# Patient Record
Sex: Female | Born: 1940
Health system: Southern US, Community
[De-identification: ages and names within clinical notes are randomized; demographics above are authoritative.]

## PROBLEM LIST (undated history)

## (undated) DIAGNOSIS — I00 Rheumatic fever without heart involvement: Secondary | ICD-10-CM

## (undated) DIAGNOSIS — E039 Hypothyroidism, unspecified: Secondary | ICD-10-CM

## (undated) DIAGNOSIS — I10 Essential (primary) hypertension: Secondary | ICD-10-CM

## (undated) DIAGNOSIS — Z9289 Personal history of other medical treatment: Secondary | ICD-10-CM

## (undated) DIAGNOSIS — E785 Hyperlipidemia, unspecified: Secondary | ICD-10-CM

## (undated) HISTORY — DX: Personal history of other medical treatment: Z92.89

## (undated) HISTORY — DX: Hyperlipidemia, unspecified: E78.5

## (undated) HISTORY — DX: Essential (primary) hypertension: I10

## (undated) HISTORY — DX: Rheumatic fever without heart involvement: I00

## (undated) HISTORY — DX: Hypothyroidism, unspecified: E03.9

---

## 1971-10-22 HISTORY — PX: TUBAL LIGATION: SHX77

## 1989-10-21 HISTORY — PX: ROTATOR CUFF REPAIR: SHX139

## 2001-03-12 ENCOUNTER — Encounter: Payer: Self-pay | Admitting: Internal Medicine

## 2001-03-12 ENCOUNTER — Ambulatory Visit (HOSPITAL_COMMUNITY): Admission: RE | Admit: 2001-03-12 | Discharge: 2001-03-12 | Payer: Self-pay | Admitting: Internal Medicine

## 2001-03-18 ENCOUNTER — Encounter: Payer: Self-pay | Admitting: Internal Medicine

## 2001-03-18 ENCOUNTER — Ambulatory Visit (HOSPITAL_COMMUNITY): Admission: RE | Admit: 2001-03-18 | Discharge: 2001-03-18 | Payer: Self-pay | Admitting: Internal Medicine

## 2001-12-31 ENCOUNTER — Encounter: Payer: Self-pay | Admitting: Internal Medicine

## 2001-12-31 ENCOUNTER — Ambulatory Visit (HOSPITAL_COMMUNITY): Admission: RE | Admit: 2001-12-31 | Discharge: 2001-12-31 | Payer: Self-pay | Admitting: Internal Medicine

## 2003-03-24 ENCOUNTER — Ambulatory Visit (HOSPITAL_COMMUNITY): Admission: RE | Admit: 2003-03-24 | Discharge: 2003-03-24 | Payer: Self-pay | Admitting: Internal Medicine

## 2003-03-24 ENCOUNTER — Encounter: Payer: Self-pay | Admitting: Internal Medicine

## 2003-03-25 ENCOUNTER — Ambulatory Visit (HOSPITAL_COMMUNITY): Admission: RE | Admit: 2003-03-25 | Discharge: 2003-03-25 | Payer: Self-pay | Admitting: Internal Medicine

## 2003-03-25 ENCOUNTER — Encounter: Payer: Self-pay | Admitting: Internal Medicine

## 2003-05-11 ENCOUNTER — Encounter: Payer: Self-pay | Admitting: Internal Medicine

## 2003-05-11 ENCOUNTER — Ambulatory Visit (HOSPITAL_COMMUNITY): Admission: RE | Admit: 2003-05-11 | Discharge: 2003-05-11 | Payer: Self-pay | Admitting: Internal Medicine

## 2003-08-11 ENCOUNTER — Ambulatory Visit (HOSPITAL_COMMUNITY): Admission: RE | Admit: 2003-08-11 | Discharge: 2003-08-11 | Payer: Self-pay | Admitting: Internal Medicine

## 2003-08-11 ENCOUNTER — Encounter: Payer: Self-pay | Admitting: Internal Medicine

## 2004-06-14 ENCOUNTER — Ambulatory Visit (HOSPITAL_COMMUNITY): Admission: RE | Admit: 2004-06-14 | Discharge: 2004-06-14 | Payer: Self-pay | Admitting: Internal Medicine

## 2004-06-18 ENCOUNTER — Other Ambulatory Visit: Admission: RE | Admit: 2004-06-18 | Discharge: 2004-06-18 | Payer: Self-pay | Admitting: *Deleted

## 2004-07-02 ENCOUNTER — Ambulatory Visit (HOSPITAL_COMMUNITY): Admission: RE | Admit: 2004-07-02 | Discharge: 2004-07-02 | Payer: Self-pay | Admitting: Family Medicine

## 2004-08-23 ENCOUNTER — Ambulatory Visit (HOSPITAL_COMMUNITY): Admission: RE | Admit: 2004-08-23 | Discharge: 2004-08-23 | Payer: Self-pay | Admitting: Family Medicine

## 2004-09-07 ENCOUNTER — Ambulatory Visit (HOSPITAL_COMMUNITY): Admission: RE | Admit: 2004-09-07 | Discharge: 2004-09-07 | Payer: Self-pay | Admitting: General Surgery

## 2005-07-12 ENCOUNTER — Ambulatory Visit (HOSPITAL_COMMUNITY): Admission: RE | Admit: 2005-07-12 | Discharge: 2005-07-12 | Payer: Self-pay | Admitting: Internal Medicine

## 2005-07-15 ENCOUNTER — Ambulatory Visit (HOSPITAL_COMMUNITY): Admission: RE | Admit: 2005-07-15 | Discharge: 2005-07-15 | Payer: Self-pay | Admitting: Internal Medicine

## 2005-07-26 ENCOUNTER — Encounter: Admission: RE | Admit: 2005-07-26 | Discharge: 2005-07-26 | Payer: Self-pay | Admitting: Internal Medicine

## 2006-03-19 ENCOUNTER — Other Ambulatory Visit: Admission: RE | Admit: 2006-03-19 | Discharge: 2006-03-19 | Payer: Self-pay | Admitting: *Deleted

## 2006-08-11 ENCOUNTER — Ambulatory Visit (HOSPITAL_COMMUNITY): Admission: RE | Admit: 2006-08-11 | Discharge: 2006-08-11 | Payer: Self-pay | Admitting: Internal Medicine

## 2007-08-13 ENCOUNTER — Ambulatory Visit (HOSPITAL_COMMUNITY): Admission: RE | Admit: 2007-08-13 | Discharge: 2007-08-13 | Payer: Self-pay | Admitting: Internal Medicine

## 2008-03-31 ENCOUNTER — Ambulatory Visit (HOSPITAL_COMMUNITY): Admission: RE | Admit: 2008-03-31 | Discharge: 2008-03-31 | Payer: Self-pay | Admitting: Internal Medicine

## 2008-04-11 ENCOUNTER — Ambulatory Visit (HOSPITAL_COMMUNITY): Admission: RE | Admit: 2008-04-11 | Discharge: 2008-04-11 | Payer: Self-pay | Admitting: Internal Medicine

## 2008-08-25 ENCOUNTER — Ambulatory Visit (HOSPITAL_COMMUNITY): Admission: RE | Admit: 2008-08-25 | Discharge: 2008-08-25 | Payer: Self-pay | Admitting: Internal Medicine

## 2008-09-07 ENCOUNTER — Ambulatory Visit (HOSPITAL_COMMUNITY): Admission: RE | Admit: 2008-09-07 | Discharge: 2008-09-07 | Payer: Self-pay | Admitting: Internal Medicine

## 2009-03-29 ENCOUNTER — Ambulatory Visit (HOSPITAL_COMMUNITY): Admission: RE | Admit: 2009-03-29 | Discharge: 2009-03-29 | Payer: Self-pay | Admitting: Internal Medicine

## 2009-04-04 ENCOUNTER — Encounter (HOSPITAL_COMMUNITY): Admission: RE | Admit: 2009-04-04 | Discharge: 2009-05-04 | Payer: Self-pay | Admitting: Internal Medicine

## 2009-08-28 ENCOUNTER — Ambulatory Visit (HOSPITAL_COMMUNITY): Admission: RE | Admit: 2009-08-28 | Discharge: 2009-08-28 | Payer: Self-pay | Admitting: Internal Medicine

## 2009-10-19 ENCOUNTER — Ambulatory Visit (HOSPITAL_COMMUNITY): Admission: RE | Admit: 2009-10-19 | Discharge: 2009-10-19 | Payer: Self-pay | Admitting: Family Medicine

## 2009-12-04 ENCOUNTER — Ambulatory Visit (HOSPITAL_COMMUNITY): Admission: RE | Admit: 2009-12-04 | Discharge: 2009-12-04 | Payer: Self-pay | Admitting: Family Medicine

## 2009-12-07 ENCOUNTER — Ambulatory Visit (HOSPITAL_COMMUNITY): Admission: RE | Admit: 2009-12-07 | Discharge: 2009-12-07 | Payer: Self-pay | Admitting: Family Medicine

## 2010-01-04 ENCOUNTER — Encounter (HOSPITAL_COMMUNITY): Admission: RE | Admit: 2010-01-04 | Discharge: 2010-02-03 | Payer: Self-pay | Admitting: Family Medicine

## 2010-02-05 ENCOUNTER — Encounter (HOSPITAL_COMMUNITY): Admission: RE | Admit: 2010-02-05 | Discharge: 2010-03-08 | Payer: Self-pay | Admitting: Family Medicine

## 2010-09-20 ENCOUNTER — Ambulatory Visit (HOSPITAL_COMMUNITY): Admission: RE | Admit: 2010-09-20 | Discharge: 2010-09-20 | Payer: Self-pay | Admitting: Internal Medicine

## 2010-11-11 ENCOUNTER — Encounter: Payer: Self-pay | Admitting: Internal Medicine

## 2011-03-08 NOTE — H&P (Signed)
NAME:  Kaitlyn Baker, RABEN NO.:  [id number]   MEDICAL RECORD NO.:  [id number]          PATIENT TYPE:   LOCATION:                                 FACILITY:   PHYSICIAN:  Dalia Heading, M.D.  DATE OF BIRTH:  1941-05-07   DATE OF ADMISSION:  DATE OF DISCHARGE:  LH                                HISTORY & PHYSICAL   CHIEF COMPLAINT:  Hematochezia, change in bowel habits.   HISTORY OF PRESENT ILLNESS:  The patient is a 70 year old white female who  is referred for endoscopic evaluation.  She needs a colonoscopy for  hematochezia.  No abdominal pain, weight loss, nausea, vomiting, diarrhea,  constipation, or melena have been noted.  She has also had a change in bowel  habits.  Her stools seem looser.  She has never had a colonoscopy.  There is  no family history of colon carcinoma.   PAST MEDICAL HISTORY:  1.  Hypertension.  2.  Depression.   PAST SURGICAL HISTORY:  1.  Rotator cuff repair.  2.  Tubal ligation.   CURRENT MEDICATIONS:  1.  Lotrel.  2.  Lexapro.  3.  Lunesta.   ALLERGIES:  No known drug allergies.   REVIEW OF SYSTEMS:  Noncontributory.   PHYSICAL EXAMINATION:  GENERAL:  The patient is a well-developed, well-  nourished white female in no acute distress.  VITAL SIGNS:  Afebrile, vital signs stable.  LUNGS:  Clear to auscultation with equal breath sounds bilaterally.  HEART:  Regular rate and rhythm without S3, S4, or murmurs.  ABDOMEN:  Benign.  RECTAL:  Deferred to the procedure.   IMPRESSION:  1.  Hematochezia.  2.  Change in bowel habits.   PLAN:  The patient is scheduled for a colonoscopy on September 07, 2004.  The  risks and benefits of the procedure, including bleeding and perforation were  fully explained to the patient, who gave informed consent.     Mark   MAJ/MEDQ  D:  08/30/2004  T:  08/30/2004  Job:  [id number]   cc:   Short Stay at [facility]   Corrie Mckusick, M.D.  Fax: [phone]

## 2011-08-12 ENCOUNTER — Other Ambulatory Visit (HOSPITAL_COMMUNITY): Payer: Self-pay | Admitting: Internal Medicine

## 2011-08-12 DIAGNOSIS — Z139 Encounter for screening, unspecified: Secondary | ICD-10-CM

## 2011-09-23 ENCOUNTER — Ambulatory Visit (HOSPITAL_COMMUNITY)
Admission: RE | Admit: 2011-09-23 | Discharge: 2011-09-23 | Disposition: A | Discharge status: Home / Self Care | Payer: Medicare Other | Source: Ambulatory Visit | Attending: Internal Medicine | Admitting: Internal Medicine

## 2011-09-23 DIAGNOSIS — Z139 Encounter for screening, unspecified: Secondary | ICD-10-CM

## 2011-09-23 DIAGNOSIS — Z1231 Encounter for screening mammogram for malignant neoplasm of breast: Secondary | ICD-10-CM | POA: Insufficient documentation

## 2012-04-16 DIAGNOSIS — H04129 Dry eye syndrome of unspecified lacrimal gland: Secondary | ICD-10-CM | POA: Diagnosis not present

## 2012-04-16 DIAGNOSIS — H2510 Age-related nuclear cataract, unspecified eye: Secondary | ICD-10-CM | POA: Diagnosis not present

## 2012-04-16 DIAGNOSIS — Q159 Congenital malformation of eye, unspecified: Secondary | ICD-10-CM | POA: Diagnosis not present

## 2012-04-16 DIAGNOSIS — H40019 Open angle with borderline findings, low risk, unspecified eye: Secondary | ICD-10-CM | POA: Diagnosis not present

## 2012-04-20 DIAGNOSIS — I10 Essential (primary) hypertension: Secondary | ICD-10-CM | POA: Diagnosis not present

## 2012-04-20 DIAGNOSIS — R079 Chest pain, unspecified: Secondary | ICD-10-CM | POA: Diagnosis not present

## 2012-04-20 DIAGNOSIS — Z6828 Body mass index (BMI) 28.0-28.9, adult: Secondary | ICD-10-CM | POA: Diagnosis not present

## 2012-05-21 DIAGNOSIS — E782 Mixed hyperlipidemia: Secondary | ICD-10-CM | POA: Diagnosis not present

## 2012-05-21 DIAGNOSIS — R079 Chest pain, unspecified: Secondary | ICD-10-CM | POA: Diagnosis not present

## 2012-05-21 DIAGNOSIS — I10 Essential (primary) hypertension: Secondary | ICD-10-CM | POA: Diagnosis not present

## 2012-05-28 DIAGNOSIS — R079 Chest pain, unspecified: Secondary | ICD-10-CM | POA: Diagnosis not present

## 2012-05-28 DIAGNOSIS — E782 Mixed hyperlipidemia: Secondary | ICD-10-CM | POA: Diagnosis not present

## 2012-05-28 DIAGNOSIS — I10 Essential (primary) hypertension: Secondary | ICD-10-CM | POA: Diagnosis not present

## 2012-05-28 DIAGNOSIS — Z8249 Family history of ischemic heart disease and other diseases of the circulatory system: Secondary | ICD-10-CM | POA: Diagnosis not present

## 2012-08-10 DIAGNOSIS — E782 Mixed hyperlipidemia: Secondary | ICD-10-CM | POA: Diagnosis not present

## 2012-08-10 DIAGNOSIS — Z79899 Other long term (current) drug therapy: Secondary | ICD-10-CM | POA: Diagnosis not present

## 2012-08-13 DIAGNOSIS — I10 Essential (primary) hypertension: Secondary | ICD-10-CM | POA: Diagnosis not present

## 2012-08-13 DIAGNOSIS — E782 Mixed hyperlipidemia: Secondary | ICD-10-CM | POA: Diagnosis not present

## 2012-08-27 ENCOUNTER — Other Ambulatory Visit (HOSPITAL_COMMUNITY): Payer: Self-pay | Admitting: Internal Medicine

## 2012-08-27 DIAGNOSIS — Z139 Encounter for screening, unspecified: Secondary | ICD-10-CM

## 2012-08-28 ENCOUNTER — Other Ambulatory Visit (HOSPITAL_COMMUNITY): Payer: Self-pay | Admitting: Internal Medicine

## 2012-08-28 DIAGNOSIS — J042 Acute laryngotracheitis: Secondary | ICD-10-CM | POA: Diagnosis not present

## 2012-08-28 DIAGNOSIS — Z13820 Encounter for screening for osteoporosis: Secondary | ICD-10-CM

## 2012-08-28 DIAGNOSIS — Z0000 Encounter for general adult medical examination without abnormal findings: Secondary | ICD-10-CM

## 2012-08-28 DIAGNOSIS — Z139 Encounter for screening, unspecified: Secondary | ICD-10-CM

## 2012-08-28 DIAGNOSIS — I10 Essential (primary) hypertension: Secondary | ICD-10-CM | POA: Diagnosis not present

## 2012-08-31 DIAGNOSIS — Z23 Encounter for immunization: Secondary | ICD-10-CM | POA: Diagnosis not present

## 2012-09-03 ENCOUNTER — Other Ambulatory Visit (HOSPITAL_COMMUNITY): Payer: Medicare Other

## 2012-09-08 ENCOUNTER — Ambulatory Visit (HOSPITAL_COMMUNITY)
Admission: RE | Admit: 2012-09-08 | Discharge: 2012-09-08 | Disposition: A | Discharge status: Home / Self Care | Payer: Medicare Other | Source: Ambulatory Visit | Attending: Internal Medicine | Admitting: Internal Medicine

## 2012-09-08 DIAGNOSIS — M810 Age-related osteoporosis without current pathological fracture: Secondary | ICD-10-CM | POA: Insufficient documentation

## 2012-09-08 DIAGNOSIS — Z13820 Encounter for screening for osteoporosis: Secondary | ICD-10-CM

## 2012-09-08 DIAGNOSIS — Z0000 Encounter for general adult medical examination without abnormal findings: Secondary | ICD-10-CM

## 2012-09-28 ENCOUNTER — Ambulatory Visit (HOSPITAL_COMMUNITY)
Admission: RE | Admit: 2012-09-28 | Discharge: 2012-09-28 | Disposition: A | Discharge status: Home / Self Care | Payer: Medicare Other | Source: Ambulatory Visit | Attending: Internal Medicine | Admitting: Internal Medicine

## 2012-09-28 DIAGNOSIS — Z1231 Encounter for screening mammogram for malignant neoplasm of breast: Secondary | ICD-10-CM | POA: Diagnosis not present

## 2012-09-28 DIAGNOSIS — Z139 Encounter for screening, unspecified: Secondary | ICD-10-CM

## 2012-11-23 DIAGNOSIS — H40019 Open angle with borderline findings, low risk, unspecified eye: Secondary | ICD-10-CM | POA: Diagnosis not present

## 2012-12-10 DIAGNOSIS — H40019 Open angle with borderline findings, low risk, unspecified eye: Secondary | ICD-10-CM | POA: Diagnosis not present

## 2012-12-10 DIAGNOSIS — H16109 Unspecified superficial keratitis, unspecified eye: Secondary | ICD-10-CM | POA: Diagnosis not present

## 2013-03-09 DIAGNOSIS — M545 Low back pain: Secondary | ICD-10-CM | POA: Diagnosis not present

## 2013-03-09 DIAGNOSIS — M5430 Sciatica, unspecified side: Secondary | ICD-10-CM | POA: Diagnosis not present

## 2013-03-09 DIAGNOSIS — Z6828 Body mass index (BMI) 28.0-28.9, adult: Secondary | ICD-10-CM | POA: Diagnosis not present

## 2013-03-18 DIAGNOSIS — L820 Inflamed seborrheic keratosis: Secondary | ICD-10-CM | POA: Diagnosis not present

## 2013-03-18 DIAGNOSIS — D235 Other benign neoplasm of skin of trunk: Secondary | ICD-10-CM | POA: Diagnosis not present

## 2013-03-18 DIAGNOSIS — L680 Hirsutism: Secondary | ICD-10-CM | POA: Diagnosis not present

## 2013-03-18 DIAGNOSIS — L570 Actinic keratosis: Secondary | ICD-10-CM | POA: Diagnosis not present

## 2013-06-11 DIAGNOSIS — H40019 Open angle with borderline findings, low risk, unspecified eye: Secondary | ICD-10-CM | POA: Diagnosis not present

## 2013-06-11 DIAGNOSIS — H04129 Dry eye syndrome of unspecified lacrimal gland: Secondary | ICD-10-CM | POA: Diagnosis not present

## 2013-06-11 DIAGNOSIS — H1045 Other chronic allergic conjunctivitis: Secondary | ICD-10-CM | POA: Diagnosis not present

## 2013-07-03 DIAGNOSIS — IMO0002 Reserved for concepts with insufficient information to code with codable children: Secondary | ICD-10-CM | POA: Diagnosis not present

## 2013-07-03 DIAGNOSIS — Z23 Encounter for immunization: Secondary | ICD-10-CM | POA: Diagnosis not present

## 2013-07-03 DIAGNOSIS — Z681 Body mass index (BMI) 19 or less, adult: Secondary | ICD-10-CM | POA: Diagnosis not present

## 2013-07-03 DIAGNOSIS — I10 Essential (primary) hypertension: Secondary | ICD-10-CM | POA: Diagnosis not present

## 2013-07-03 DIAGNOSIS — G5600 Carpal tunnel syndrome, unspecified upper limb: Secondary | ICD-10-CM | POA: Diagnosis not present

## 2013-07-06 DIAGNOSIS — Z23 Encounter for immunization: Secondary | ICD-10-CM | POA: Diagnosis not present

## 2013-07-06 DIAGNOSIS — Z79899 Other long term (current) drug therapy: Secondary | ICD-10-CM | POA: Diagnosis not present

## 2013-07-30 ENCOUNTER — Encounter: Payer: Self-pay | Admitting: Cardiology

## 2013-07-30 DIAGNOSIS — R079 Chest pain, unspecified: Secondary | ICD-10-CM | POA: Insufficient documentation

## 2013-07-30 DIAGNOSIS — I10 Essential (primary) hypertension: Secondary | ICD-10-CM

## 2013-07-30 DIAGNOSIS — E785 Hyperlipidemia, unspecified: Secondary | ICD-10-CM | POA: Insufficient documentation

## 2013-08-02 ENCOUNTER — Ambulatory Visit (INDEPENDENT_AMBULATORY_CARE_PROVIDER_SITE_OTHER): Payer: Medicare Other | Admitting: Cardiovascular Disease

## 2013-08-02 ENCOUNTER — Encounter: Payer: Self-pay | Admitting: Cardiovascular Disease

## 2013-08-02 VITALS — BP 122/70 | HR 73 | Ht 66.0 in | Wt 172.0 lb

## 2013-08-02 DIAGNOSIS — I10 Essential (primary) hypertension: Secondary | ICD-10-CM | POA: Diagnosis not present

## 2013-08-02 DIAGNOSIS — E785 Hyperlipidemia, unspecified: Secondary | ICD-10-CM | POA: Diagnosis not present

## 2013-08-02 DIAGNOSIS — R079 Chest pain, unspecified: Secondary | ICD-10-CM

## 2013-08-02 NOTE — Assessment & Plan Note (Signed)
Not on statin therapy. Her most recent lab work performed 07/06/13 her total cholesterol of194, LDL of 123, HDL of 49.

## 2013-08-02 NOTE — Progress Notes (Signed)
     08/02/2013 Kaitlyn Baker   [birth year]  [id number]  Primary Physician Cassell Smiles., MD Primary Cardiologist: Runell Gess MD Kaitlyn Baker   HPI:  The patient is a 72 year old mildly overweight married Caucasian female, mother of 3, whose husband is also a patient of mine. I last saw her 3 months ago. She has a history of hyperlipidemia, treated hypertension, and family history of heart disease. She was complaining of some atypical chest pain radiating to her back, which is a new finding for her. She had a Myoview stress test performed on May 28, 2012, which was entirely normal, and subsequent to that her symptoms completely resolved. Recent lab work revealed total cholesterol of 193 LDL of 123, HDL of 49. I saw her in the office one year ago she's been completely asymptomatic.     Current Outpatient Prescriptions  Medication Sig Dispense Refill  . amLODipine-benazepril (LOTREL) 10-20 MG per capsule Take 1 capsule by mouth daily.      Marland Kitchen aspirin 81 MG tablet Take 81 mg by mouth daily.      Marland Kitchen ibuprofen (ADVIL,MOTRIN) 600 MG tablet       . levothyroxine (SYNTHROID, LEVOTHROID) 25 MCG tablet        No current facility-administered medications for this visit.    Allergies  Allergen Reactions  . Depo-Medrol [Methylprednisolone] Rash    Severe rash and edema    History   Social History  . Marital Status: Married    Spouse Name: N/A    Number of Children: N/A  . Years of Education: N/A   Occupational History  . Not on file.   Social History Main Topics  . Smoking status: Never Smoker   . Smokeless tobacco: Not on file  . Alcohol Use: Not on file  . Drug Use: Not on file  . Sexual Activity: Not on file   Other Topics Concern  . Not on file   Social History Narrative   Retired Charity fundraiser     Review of Systems: General: negative for chills, fever, night sweats or weight changes.  Cardiovascular: negative for chest pain, dyspnea on exertion, edema,  orthopnea, palpitations, paroxysmal nocturnal dyspnea or shortness of breath Dermatological: negative for rash Respiratory: negative for cough or wheezing Urologic: negative for hematuria Abdominal: negative for nausea, vomiting, diarrhea, bright red blood per rectum, melena, or hematemesis Neurologic: negative for visual changes, syncope, or dizziness All other systems reviewed and are otherwise negative except as noted above.    Blood pressure 122/70, pulse 73, height 5\' 6"  (1.676 m), weight 172 lb (78.019 kg).  General appearance: alert and no distress Neck: no adenopathy, no carotid bruit, no JVD, supple, symmetrical, trachea midline and thyroid not enlarged, symmetric, no tenderness/mass/nodules Lungs: clear to auscultation bilaterally Heart: regular rate and rhythm, S1, S2 normal, no murmur, click, rub or gallop Extremities: extremities normal, atraumatic, no cyanosis or edema  EKG normal sinus rhythm at 73 without ST or T wave changes  ASSESSMENT AND PLAN:   HTN (hypertension) Controlled on current medications  Dyslipidemia Not on statin therapy. Her most recent lab work performed 07/06/13 her total cholesterol of194, LDL of 123, HDL of 49.      Runell Gess MD FACP,FACC,FAHA, [facility] 08/02/2013 9:20 AM

## 2013-08-02 NOTE — Patient Instructions (Signed)
Your physician wants you to follow-up in: 1 year with Dr Berry. You will receive a reminder letter in the mail two months in advance. If you don't receive a letter, please call our office to schedule the follow-up appointment.  

## 2013-08-02 NOTE — Assessment & Plan Note (Signed)
Controlled on current medications 

## 2013-08-03 DIAGNOSIS — E039 Hypothyroidism, unspecified: Secondary | ICD-10-CM | POA: Diagnosis not present

## 2013-08-24 ENCOUNTER — Other Ambulatory Visit (HOSPITAL_COMMUNITY): Payer: Self-pay | Admitting: Internal Medicine

## 2013-08-24 DIAGNOSIS — Z139 Encounter for screening, unspecified: Secondary | ICD-10-CM

## 2013-09-30 ENCOUNTER — Ambulatory Visit (HOSPITAL_COMMUNITY)
Admission: RE | Admit: 2013-09-30 | Discharge: 2013-09-30 | Disposition: A | Discharge status: Home / Self Care | Payer: Medicare Other | Source: Ambulatory Visit | Attending: Internal Medicine | Admitting: Internal Medicine

## 2013-09-30 DIAGNOSIS — Z1231 Encounter for screening mammogram for malignant neoplasm of breast: Secondary | ICD-10-CM | POA: Insufficient documentation

## 2013-09-30 DIAGNOSIS — Z139 Encounter for screening, unspecified: Secondary | ICD-10-CM

## 2013-12-10 DIAGNOSIS — H16109 Unspecified superficial keratitis, unspecified eye: Secondary | ICD-10-CM | POA: Diagnosis not present

## 2013-12-10 DIAGNOSIS — H40019 Open angle with borderline findings, low risk, unspecified eye: Secondary | ICD-10-CM | POA: Diagnosis not present

## 2013-12-10 DIAGNOSIS — H04129 Dry eye syndrome of unspecified lacrimal gland: Secondary | ICD-10-CM | POA: Diagnosis not present

## 2013-12-10 DIAGNOSIS — H2510 Age-related nuclear cataract, unspecified eye: Secondary | ICD-10-CM | POA: Diagnosis not present

## 2014-04-25 DIAGNOSIS — Z681 Body mass index (BMI) 19 or less, adult: Secondary | ICD-10-CM | POA: Diagnosis not present

## 2014-04-25 DIAGNOSIS — M5430 Sciatica, unspecified side: Secondary | ICD-10-CM | POA: Diagnosis not present

## 2014-05-17 DIAGNOSIS — M5137 Other intervertebral disc degeneration, lumbosacral region: Secondary | ICD-10-CM | POA: Diagnosis not present

## 2014-05-17 DIAGNOSIS — Z6828 Body mass index (BMI) 28.0-28.9, adult: Secondary | ICD-10-CM | POA: Diagnosis not present

## 2014-05-19 DIAGNOSIS — M5126 Other intervertebral disc displacement, lumbar region: Secondary | ICD-10-CM | POA: Diagnosis not present

## 2014-05-19 DIAGNOSIS — M5137 Other intervertebral disc degeneration, lumbosacral region: Secondary | ICD-10-CM | POA: Diagnosis not present

## 2014-06-13 DIAGNOSIS — H16109 Unspecified superficial keratitis, unspecified eye: Secondary | ICD-10-CM | POA: Diagnosis not present

## 2014-06-13 DIAGNOSIS — H04129 Dry eye syndrome of unspecified lacrimal gland: Secondary | ICD-10-CM | POA: Diagnosis not present

## 2014-06-13 DIAGNOSIS — H40019 Open angle with borderline findings, low risk, unspecified eye: Secondary | ICD-10-CM | POA: Diagnosis not present

## 2014-06-13 DIAGNOSIS — H259 Unspecified age-related cataract: Secondary | ICD-10-CM | POA: Diagnosis not present

## 2014-06-21 DIAGNOSIS — M25559 Pain in unspecified hip: Secondary | ICD-10-CM | POA: Diagnosis not present

## 2014-06-21 DIAGNOSIS — Z6827 Body mass index (BMI) 27.0-27.9, adult: Secondary | ICD-10-CM | POA: Diagnosis not present

## 2014-08-05 DIAGNOSIS — Z23 Encounter for immunization: Secondary | ICD-10-CM | POA: Diagnosis not present

## 2014-08-09 ENCOUNTER — Ambulatory Visit (INDEPENDENT_AMBULATORY_CARE_PROVIDER_SITE_OTHER): Payer: Medicare Other | Admitting: Cardiovascular Disease

## 2014-08-09 ENCOUNTER — Encounter: Payer: Self-pay | Admitting: Cardiovascular Disease

## 2014-08-09 VITALS — BP 138/70 | HR 65 | Ht 66.0 in | Wt 171.0 lb

## 2014-08-09 DIAGNOSIS — Z79899 Other long term (current) drug therapy: Secondary | ICD-10-CM

## 2014-08-09 DIAGNOSIS — I10 Essential (primary) hypertension: Secondary | ICD-10-CM | POA: Diagnosis not present

## 2014-08-09 DIAGNOSIS — E785 Hyperlipidemia, unspecified: Secondary | ICD-10-CM | POA: Diagnosis not present

## 2014-08-09 LAB — CBC
HEMATOCRIT: 38.2 % (ref 36.0–46.0)
Hemoglobin: 12.9 g/dL (ref 12.0–15.0)
MCH: 28.5 pg (ref 26.0–34.0)
MCHC: 33.8 g/dL (ref 30.0–36.0)
MCV: 84.3 fL (ref 78.0–100.0)
Platelets: 284 10*3/uL (ref 150–400)
RBC: 4.53 MIL/uL (ref 3.87–5.11)
RDW: 14.5 % (ref 11.5–15.5)
WBC: 5.5 10*3/uL (ref 4.0–10.5)

## 2014-08-09 LAB — COMPLETE METABOLIC PANEL WITH GFR
ALK PHOS: 70 U/L (ref 39–117)
ALT: 18 U/L (ref 0–35)
AST: 15 U/L (ref 0–37)
Albumin: 4.4 g/dL (ref 3.5–5.2)
BILIRUBIN TOTAL: 1.1 mg/dL (ref 0.2–1.2)
BUN: 17 mg/dL (ref 6–23)
CO2: 24 mEq/L (ref 19–32)
Calcium: 9.6 mg/dL (ref 8.4–10.5)
Chloride: 108 mEq/L (ref 96–112)
Creat: 0.6 mg/dL (ref 0.50–1.10)
GFR, Est African American: >89 mL/min
Glucose, Bld: 98 mg/dL (ref 70–99)
Potassium: 4.2 mEq/L (ref 3.5–5.3)
SODIUM: 141 meq/L (ref 135–145)
TOTAL PROTEIN: 6.3 g/dL (ref 6.0–8.3)

## 2014-08-09 LAB — LIPID PANEL
CHOL/HDL RATIO: 4.2 ratio
Cholesterol: 206 mg/dL — ABNORMAL HIGH (ref 0–200)
HDL: 49 mg/dL (ref >39)
LDL Cholesterol: 137 mg/dL — ABNORMAL HIGH (ref 0–99)
TRIGLYCERIDES: 98 mg/dL (ref <150)
VLDL: 20 mg/dL (ref 0–40)

## 2014-08-09 LAB — TSH: TSH: 3.89 u[IU]/mL (ref 0.350–4.500)

## 2014-08-09 NOTE — Progress Notes (Signed)
     08/09/2014 Kaitlyn Baker   31-May-1941  [id number]  Primary Physician Glo Herring., MD Primary Cardiologist: Lorretta Harp MD [name]   HPI:  The patient is a this 73 year old mildly overweight married Caucasian female, mother of 54, whose husband is also a patient of mine. I last saw her 12 months ago. She has a history of hyperlipidemia, treated hypertension, and family history of heart disease. She was complaining of some atypical chest pain radiating to her back, which is a new finding for her 2 years ago. She had a Myoview stress test performed on May 28, 2012, which was entirely normal, and subsequent to that her symptoms completely resolved. Since I saw her one year ago she [place] asymptomatic except for some sciatic/back pain. She has not had blood work in over a year.    Current Outpatient Prescriptions  Medication Sig Dispense Refill  . amLODipine-benazepril (LOTREL) 10-20 MG per capsule Take 1 capsule by mouth daily.      Marland Kitchen aspirin 81 MG tablet Take 81 mg by mouth daily.      Marland Kitchen ibuprofen (ADVIL,MOTRIN) 600 MG tablet       . levothyroxine (SYNTHROID, LEVOTHROID) 25 MCG tablet        No current facility-administered medications for this visit.    Allergies  Allergen Reactions  . Depo-Medrol [Methylprednisolone] Rash    Severe rash and edema    History   Social History  . Marital Status: Married    Spouse Name: N/A    Number of Children: N/A  . Years of Education: N/A   Occupational History  . Not on file.   Social History Main Topics  . Smoking status: Never Smoker   . Smokeless tobacco: Not on file  . Alcohol Use: Not on file  . Drug Use: Not on file  . Sexual Activity: Not on file   Other Topics Concern  . Not on file   Social History Narrative   Retired Therapist, sports     Review of Systems: General: negative for chills, fever, night sweats or weight changes.  Cardiovascular: negative for chest pain, dyspnea on exertion,  edema, orthopnea, palpitations, paroxysmal nocturnal dyspnea or shortness of breath Dermatological: negative for rash Respiratory: negative for cough or wheezing Urologic: negative for hematuria Abdominal: negative for nausea, vomiting, diarrhea, bright red blood per rectum, melena, or hematemesis Neurologic: negative for visual changes, syncope, or dizziness All other systems reviewed and are otherwise negative except as noted above.    Blood pressure 138/70, pulse 65, height 5\' 6"  (1.676 m), weight 171 lb (77.565 kg).  General appearance: alert and no distress Neck: no adenopathy, no carotid bruit, no JVD, supple, symmetrical, trachea midline and thyroid not enlarged, symmetric, no tenderness/mass/nodules Lungs: clear to auscultation bilaterally Heart: regular rate and rhythm, S1, S2 normal, no murmur, click, rub or gallop Extremities: extremities normal, atraumatic, no cyanosis or edema  EKG normal sinus rhythm at 65 without ST or T wave changes  ASSESSMENT AND PLAN:   HTN (hypertension) Well controlled on current medications  Dyslipidemia Not on statin therapy. We will check a lipid profile      Lorretta Harp MD [facility], [facility] 08/09/2014 7:57 AM

## 2014-08-09 NOTE — Assessment & Plan Note (Signed)
Not on statin therapy. We will check a lipid profile

## 2014-08-09 NOTE — Assessment & Plan Note (Signed)
Well-controlled on current medications 

## 2014-08-09 NOTE — Patient Instructions (Signed)
  We will see you back in follow up in 1 year with Dr Gwenlyn Found.   Dr Gwenlyn Found has ordered: Your physician recommends that you return for a FASTING lipid profile: today

## 2014-08-24 ENCOUNTER — Encounter: Payer: Self-pay | Admitting: *Deleted

## 2014-08-24 ENCOUNTER — Telehealth: Payer: Self-pay | Admitting: Cardiovascular Disease

## 2014-08-24 DIAGNOSIS — E785 Hyperlipidemia, unspecified: Secondary | ICD-10-CM

## 2014-08-24 DIAGNOSIS — Z79899 Other long term (current) drug therapy: Secondary | ICD-10-CM

## 2014-08-24 NOTE — Telephone Encounter (Signed)
Pt would like a copy of her lab results drom 08-09-14 please.

## 2014-08-24 NOTE — Telephone Encounter (Signed)
Spoke with a man who stated Ms. Yum was not home and would not be in until after 12:30pm. Asked if the office would call back after lunch. Will return call after lunch.  Lavada Mesi also stated that pt would like a copy mailed to her as well.

## 2014-08-24 NOTE — Telephone Encounter (Signed)
-----   Message from Lorretta Harp, MD sent at 08/14/2014  3:00 PM EDT ----- Not at goal for primary prevention. Start Crestor 5 mg 3 X/week and recheck 3 months

## 2014-08-24 NOTE — Telephone Encounter (Signed)
Informed pt of what Dr. Gwenlyn Found ordered. Pt was adamant that she was NOT starting cholesterol medications. Pt stated that she would become stricter with her diet and exercise. She would like to see where she stands in 3 months with lab recheck. Made pt aware of what was bring mailed. Pt also asked for TSH value and gave that info.

## 2014-09-30 ENCOUNTER — Other Ambulatory Visit (HOSPITAL_COMMUNITY): Payer: Self-pay | Admitting: Internal Medicine

## 2014-09-30 DIAGNOSIS — Z1231 Encounter for screening mammogram for malignant neoplasm of breast: Secondary | ICD-10-CM

## 2014-10-05 ENCOUNTER — Ambulatory Visit (HOSPITAL_COMMUNITY): Payer: Medicare Other

## 2014-10-06 ENCOUNTER — Ambulatory Visit (HOSPITAL_COMMUNITY)
Admission: RE | Admit: 2014-10-06 | Discharge: 2014-10-06 | Disposition: A | Discharge status: Home / Self Care | Payer: Medicare Other | Source: Ambulatory Visit | Attending: Internal Medicine | Admitting: Internal Medicine

## 2014-10-06 DIAGNOSIS — Z1231 Encounter for screening mammogram for malignant neoplasm of breast: Secondary | ICD-10-CM | POA: Diagnosis not present

## 2014-11-28 DIAGNOSIS — E663 Overweight: Secondary | ICD-10-CM | POA: Diagnosis not present

## 2014-11-28 DIAGNOSIS — M7061 Trochanteric bursitis, right hip: Secondary | ICD-10-CM | POA: Diagnosis not present

## 2014-11-28 DIAGNOSIS — Z6828 Body mass index (BMI) 28.0-28.9, adult: Secondary | ICD-10-CM | POA: Diagnosis not present

## 2014-11-28 DIAGNOSIS — T50905A Adverse effect of unspecified drugs, medicaments and biological substances, initial encounter: Secondary | ICD-10-CM | POA: Diagnosis not present

## 2014-11-28 DIAGNOSIS — Z23 Encounter for immunization: Secondary | ICD-10-CM | POA: Diagnosis not present

## 2014-11-29 ENCOUNTER — Encounter (INDEPENDENT_AMBULATORY_CARE_PROVIDER_SITE_OTHER): Payer: Self-pay | Admitting: *Deleted

## 2014-12-06 ENCOUNTER — Encounter (INDEPENDENT_AMBULATORY_CARE_PROVIDER_SITE_OTHER): Payer: Self-pay | Admitting: *Deleted

## 2014-12-06 ENCOUNTER — Other Ambulatory Visit (INDEPENDENT_AMBULATORY_CARE_PROVIDER_SITE_OTHER): Payer: Self-pay | Admitting: *Deleted

## 2014-12-06 DIAGNOSIS — Z1211 Encounter for screening for malignant neoplasm of colon: Secondary | ICD-10-CM

## 2014-12-08 ENCOUNTER — Telehealth (HOSPITAL_COMMUNITY): Payer: Self-pay

## 2014-12-08 NOTE — Telephone Encounter (Signed)
12/08/14 pt called to say that her 74 yo mother broke her ankle and she needs to take care of her.  She will call us back when she is able to schedule something.

## 2014-12-09 DIAGNOSIS — H40013 Open angle with borderline findings, low risk, bilateral: Secondary | ICD-10-CM | POA: Diagnosis not present

## 2014-12-19 ENCOUNTER — Telehealth (INDEPENDENT_AMBULATORY_CARE_PROVIDER_SITE_OTHER): Payer: Self-pay | Admitting: *Deleted

## 2014-12-19 DIAGNOSIS — Z1211 Encounter for screening for malignant neoplasm of colon: Secondary | ICD-10-CM

## 2014-12-19 NOTE — Telephone Encounter (Signed)
Patient needs movi prep 

## 2014-12-20 MED ORDER — PEG-KCL-NACL-NASULF-NA ASC-C 100 G PO SOLR: 1.0000 | Freq: Once | ORAL | Status: DC

## 2014-12-22 DIAGNOSIS — H40013 Open angle with borderline findings, low risk, bilateral: Secondary | ICD-10-CM | POA: Diagnosis not present

## 2014-12-22 DIAGNOSIS — H16103 Unspecified superficial keratitis, bilateral: Secondary | ICD-10-CM | POA: Diagnosis not present

## 2014-12-22 DIAGNOSIS — H2513 Age-related nuclear cataract, bilateral: Secondary | ICD-10-CM | POA: Diagnosis not present

## 2014-12-22 DIAGNOSIS — H04123 Dry eye syndrome of bilateral lacrimal glands: Secondary | ICD-10-CM | POA: Diagnosis not present

## 2015-01-06 ENCOUNTER — Telehealth (INDEPENDENT_AMBULATORY_CARE_PROVIDER_SITE_OTHER): Payer: Self-pay | Admitting: *Deleted

## 2015-01-06 NOTE — Telephone Encounter (Signed)
agree

## 2015-01-06 NOTE — Telephone Encounter (Signed)
Referring MD/PCP: fusco   Procedure: tcs  Reason/Indication:  screening  Has patient had this procedure before?  Yes, 2005  If so, when, by whom and where?    Is there a family history of colon cancer?  no  Who?  What age when diagnosed?    Is patient diabetic?   no      Does patient have prosthetic heart valve?  no  Do you have a pacemaker?  no  Has patient ever had endocarditis? no  Has patient had joint replacement within last 12 months?  no  Does patient tend to be constipated or take laxatives? no  Is patient on Coumadin, Plavix and/or Aspirin? yes  Medications: asa 81 mg daily, levothyroxine 25 mcg daily, amlodipine/benazpril 10/20 mg daily, ibuprofen 600 mg prn  Allergies: depo-medrol  Medication Adjustment: asa 2 days  Procedure date & time: 02/02/15 at 930

## 2015-01-26 ENCOUNTER — Other Ambulatory Visit (HOSPITAL_COMMUNITY): Payer: Self-pay | Admitting: Family Medicine

## 2015-01-26 DIAGNOSIS — S46012D Strain of muscle(s) and tendon(s) of the rotator cuff of left shoulder, subsequent encounter: Secondary | ICD-10-CM

## 2015-01-26 DIAGNOSIS — M19012 Primary osteoarthritis, left shoulder: Secondary | ICD-10-CM | POA: Diagnosis not present

## 2015-01-26 DIAGNOSIS — E663 Overweight: Secondary | ICD-10-CM | POA: Diagnosis not present

## 2015-01-26 DIAGNOSIS — Z6828 Body mass index (BMI) 28.0-28.9, adult: Secondary | ICD-10-CM | POA: Diagnosis not present

## 2015-01-31 ENCOUNTER — Ambulatory Visit (HOSPITAL_COMMUNITY)
Admission: RE | Admit: 2015-01-31 | Discharge: 2015-01-31 | Disposition: A | Discharge status: Home / Self Care | Payer: Medicare Other | Source: Ambulatory Visit | Attending: Family Medicine | Admitting: Family Medicine

## 2015-01-31 DIAGNOSIS — M75122 Complete rotator cuff tear or rupture of left shoulder, not specified as traumatic: Secondary | ICD-10-CM | POA: Diagnosis not present

## 2015-01-31 DIAGNOSIS — M25512 Pain in left shoulder: Secondary | ICD-10-CM | POA: Diagnosis not present

## 2015-01-31 DIAGNOSIS — M19012 Primary osteoarthritis, left shoulder: Secondary | ICD-10-CM

## 2015-01-31 DIAGNOSIS — S46012D Strain of muscle(s) and tendon(s) of the rotator cuff of left shoulder, subsequent encounter: Secondary | ICD-10-CM

## 2015-02-02 ENCOUNTER — Encounter (HOSPITAL_COMMUNITY): Payer: Self-pay | Admitting: *Deleted

## 2015-02-02 ENCOUNTER — Ambulatory Visit (HOSPITAL_COMMUNITY)
Admission: RE | Admit: 2015-02-02 | Discharge: 2015-02-02 | Disposition: A | Discharge status: Home Health | Payer: Medicare Other | Source: Ambulatory Visit | Attending: Internal Medicine | Admitting: Internal Medicine

## 2015-02-02 ENCOUNTER — Encounter (HOSPITAL_COMMUNITY)
Admission: RE | Disposition: A | Discharge status: Home Health | Payer: Self-pay | Source: Ambulatory Visit | Attending: Internal Medicine

## 2015-02-02 DIAGNOSIS — Z9851 Tubal ligation status: Secondary | ICD-10-CM | POA: Insufficient documentation

## 2015-02-02 DIAGNOSIS — Z7982 Long term (current) use of aspirin: Secondary | ICD-10-CM | POA: Insufficient documentation

## 2015-02-02 DIAGNOSIS — K649 Unspecified hemorrhoids: Secondary | ICD-10-CM | POA: Diagnosis not present

## 2015-02-02 DIAGNOSIS — E785 Hyperlipidemia, unspecified: Secondary | ICD-10-CM | POA: Insufficient documentation

## 2015-02-02 DIAGNOSIS — K648 Other hemorrhoids: Secondary | ICD-10-CM | POA: Diagnosis not present

## 2015-02-02 DIAGNOSIS — I10 Essential (primary) hypertension: Secondary | ICD-10-CM | POA: Insufficient documentation

## 2015-02-02 DIAGNOSIS — D120 Benign neoplasm of cecum: Secondary | ICD-10-CM | POA: Insufficient documentation

## 2015-02-02 DIAGNOSIS — Z6828 Body mass index (BMI) 28.0-28.9, adult: Secondary | ICD-10-CM | POA: Diagnosis not present

## 2015-02-02 DIAGNOSIS — K5730 Diverticulosis of large intestine without perforation or abscess without bleeding: Secondary | ICD-10-CM | POA: Insufficient documentation

## 2015-02-02 DIAGNOSIS — E663 Overweight: Secondary | ICD-10-CM | POA: Diagnosis not present

## 2015-02-02 DIAGNOSIS — T783XXA Angioneurotic edema, initial encounter: Secondary | ICD-10-CM | POA: Diagnosis not present

## 2015-02-02 DIAGNOSIS — Z1211 Encounter for screening for malignant neoplasm of colon: Secondary | ICD-10-CM | POA: Diagnosis not present

## 2015-02-02 DIAGNOSIS — E039 Hypothyroidism, unspecified: Secondary | ICD-10-CM | POA: Insufficient documentation

## 2015-02-02 DIAGNOSIS — K6289 Other specified diseases of anus and rectum: Secondary | ICD-10-CM | POA: Diagnosis not present

## 2015-02-02 DIAGNOSIS — M1991 Primary osteoarthritis, unspecified site: Secondary | ICD-10-CM | POA: Diagnosis not present

## 2015-02-02 DIAGNOSIS — M7580 Other shoulder lesions, unspecified shoulder: Secondary | ICD-10-CM | POA: Diagnosis not present

## 2015-02-02 DIAGNOSIS — T50905A Adverse effect of unspecified drugs, medicaments and biological substances, initial encounter: Secondary | ICD-10-CM | POA: Diagnosis not present

## 2015-02-02 HISTORY — PX: COLONOSCOPY: SHX5424

## 2015-02-02 SURGERY — COLONOSCOPY
Anesthesia: Moderate Sedation

## 2015-02-02 MED ORDER — STERILE WATER FOR IRRIGATION IR SOLN
Status: DC | PRN
  Administered 2015-02-02: 10:00:00

## 2015-02-02 MED ORDER — MEPERIDINE HCL 50 MG/ML IJ SOLN
INTRAMUSCULAR | Status: AC
  Filled 2015-02-02: qty 1

## 2015-02-02 MED ORDER — MEPERIDINE HCL 50 MG/ML IJ SOLN
INTRAMUSCULAR | Status: DC | PRN
  Administered 2015-02-02 (×2): 25 mg via INTRAVENOUS

## 2015-02-02 MED ORDER — MEPERIDINE HCL 50 MG/ML IJ SOLN
INTRAMUSCULAR | Status: DC
Start: 2015-02-02 — End: 2015-02-02
  Filled 2015-02-02: qty 1

## 2015-02-02 MED ORDER — MIDAZOLAM HCL 5 MG/5ML IJ SOLN
INTRAMUSCULAR | Status: AC
  Filled 2015-02-02: qty 10

## 2015-02-02 MED ORDER — MIDAZOLAM HCL 5 MG/5ML IJ SOLN
INTRAMUSCULAR | Status: DC | PRN
  Administered 2015-02-02: 2 mg via INTRAVENOUS
  Administered 2015-02-02 (×2): 1 mg via INTRAVENOUS
  Administered 2015-02-02: 2 mg via INTRAVENOUS

## 2015-02-02 MED ORDER — SODIUM CHLORIDE 0.9 % IV SOLN
INTRAVENOUS | Status: DC
  Administered 2015-02-02: 1000 mL via INTRAVENOUS

## 2015-02-02 NOTE — Discharge Instructions (Signed)
Resume usual medications and high fiber diet. No driving for 24 hours. Physician will call with biopsy results.  Colonoscopy, Care After Refer to this sheet in the next few weeks. These instructions provide you with information on caring for yourself after your procedure. Your health care provider may also give you more specific instructions. Your treatment has been planned according to current medical practices, but problems sometimes occur. Call your health care provider if you have any problems or questions after your procedure. WHAT TO EXPECT AFTER THE PROCEDURE  After your procedure, it is typical to have the following:  A small amount of blood in your stool.  Moderate amounts of gas and mild abdominal cramping or bloating. HOME CARE INSTRUCTIONS  Do not drive, operate machinery, or sign important documents for 24 hours.  You may shower and resume your regular physical activities, but move at a slower pace for the first 24 hours.  Take frequent rest periods for the first 24 hours.  Walk around or put a warm pack on your abdomen to help reduce abdominal cramping and bloating.  Drink enough fluids to keep your urine clear or pale yellow.  You may resume your normal diet as instructed by your health care provider. Avoid heavy or fried foods that are hard to digest.  Avoid drinking alcohol for 24 hours or as instructed by your health care provider.  Only take over-the-counter or prescription medicines as directed by your health care provider.  If a tissue sample (biopsy) was taken during your procedure:  Do not take aspirin or blood thinners for 7 days, or as instructed by your health care provider.  Do not drink alcohol for 7 days, or as instructed by your health care provider.  Eat soft foods for the first 24 hours. SEEK MEDICAL CARE IF: You have persistent spotting of blood in your stool 2-3 days after the procedure. SEEK IMMEDIATE MEDICAL CARE IF:  You have more than a  small spotting of blood in your stool.  You pass large blood clots in your stool.  Your abdomen is swollen (distended).  You have nausea or vomiting.  You have a fever.  You have increasing abdominal pain that is not relieved with medicine.  High-Fiber Diet Fiber is found in fruits, vegetables, and grains. A high-fiber diet encourages the addition of more whole grains, legumes, fruits, and vegetables in your diet. The recommended amount of fiber for adult males is 38 g per day. For adult females, it is 25 g per day. Pregnant and lactating women should get 28 g of fiber per day. If you have a digestive or bowel problem, ask your caregiver for advice before adding high-fiber foods to your diet. Eat a variety of high-fiber foods instead of only a select few type of foods.  PURPOSE  To increase stool bulk.  To make bowel movements more regular to prevent constipation.  To lower cholesterol.  To prevent overeating. WHEN IS THIS DIET USED?  It may be used if you have constipation and hemorrhoids.  It may be used if you have uncomplicated diverticulosis (intestine condition) and irritable bowel syndrome.  It may be used if you need help with weight management.  It may be used if you want to add it to your diet as a protective measure against atherosclerosis, diabetes, and cancer. SOURCES OF FIBER  Whole-grain breads and cereals.  Fruits, such as apples, oranges, bananas, berries, prunes, and pears.  Vegetables, such as green peas, carrots, sweet potatoes, beets, broccoli,  cabbage, spinach, and artichokes.  Legumes, such split peas, soy, lentils.  Almonds. FIBER CONTENT IN FOODS Starches and Grains / Dietary Fiber (g)  Cheerios, 1 cup / 3 g  Corn Flakes cereal, 1 cup / 0.7 g  Rice crispy treat cereal, 1 cup / 0.3 g  Instant oatmeal (cooked),  cup / 2 g  Frosted wheat cereal, 1 cup / 5.1 g  Brown, long-grain rice (cooked), 1 cup / 3.5 g  White, long-grain rice  (cooked), 1 cup / 0.6 g  Enriched macaroni (cooked), 1 cup / 2.5 g Legumes / Dietary Fiber (g)  Baked beans (canned, plain, or vegetarian),  cup / 5.2 g  Kidney beans (canned),  cup / 6.8 g  Pinto beans (cooked),  cup / 5.5 g Breads and Crackers / Dietary Fiber (g)  Plain or honey graham crackers, 2 squares / 0.7 g  Saltine crackers, 3 squares / 0.3 g  Plain, salted pretzels, 10 pieces / 1.8 g  Whole-wheat bread, 1 slice / 1.9 g  White bread, 1 slice / 0.7 g  Raisin bread, 1 slice / 1.2 g  Plain bagel, 3 oz / 2 g  Flour tortilla, 1 oz / 0.9 g  Corn tortilla, 1 small / 1.5 g  Hamburger or hotdog bun, 1 small / 0.9 g Fruits / Dietary Fiber (g)  Apple with skin, 1 medium / 4.4 g  Sweetened applesauce,  cup / 1.5 g  Banana,  medium / 1.5 g  Grapes, 10 grapes / 0.4 g  Orange, 1 small / 2.3 g  Raisin, 1.5 oz / 1.6 g  Melon, 1 cup / 1.4 g Vegetables / Dietary Fiber (g)  Green beans (canned),  cup / 1.3 g  Carrots (cooked),  cup / 2.3 g  Broccoli (cooked),  cup / 2.8 g  Peas (cooked),  cup / 4.4 g  Mashed potatoes,  cup / 1.6 g  Lettuce, 1 cup / 0.5 g  Corn (canned),  cup / 1.6 g  Tomato,  cup / 1.1 g

## 2015-02-02 NOTE — Op Note (Addendum)
COLONOSCOPY PROCEDURE REPORT  PATIENT:  Kaitlyn Baker  MR#:  [id number] Birthdate:  03-04-1941, 74 y.o., female Endoscopist:  Dr. Rogene Houston, MD Referred By:  Dr. Glo Herring, MD  Procedure Date: 02/02/2015  Procedure:   Colonoscopy  Indications:  Patient is 74 year old Caucasian female who is undergoing average risk screening colonoscopy.  Informed Consent:  The procedure and risks were reviewed with the patient and informed consent was obtained.  Medications:  Demerol 50 mg IV Versed 6 mg IV  Description of procedure:  After a digital rectal exam was performed, that colonoscope was advanced from the anus through the rectum and colon to the area of the cecum, ileocecal valve and appendiceal orifice. The cecum was deeply intubated. These structures were well-seen and photographed for the record. From the level of the cecum and ileocecal valve, the scope was slowly and cautiously withdrawn. The mucosal surfaces were carefully surveyed utilizing scope tip to flexion to facilitate fold flattening as needed. The scope was pulled down into the rectum where a thorough exam including retroflexion was performed.  Findings:   Prep excellent. 5 mm polyp cold snared from cecum located close to appendiceal orifice. Another small cecal polyp was removed using cold biopsy forceps. Both of these polyps were submitted together. Moderate number of small to medium-sized diverticula noted at sigmoid colon. Normal rectal mucosa. Small hemorrhoids below the dentate line along with anal papillae.   Therapeutic/Diagnostic Maneuvers Performed:  See above  Complications:  None  Cecal Withdrawal Time:  19 minutes  Impression:  Examination performed to cecum. Two small cecal polyps removed. One was cold snared and the other one was removed with cold biopsy forceps. These polyps were submitted together. Moderate sigmoid colon diverticulosis. External hemorrhoids and anal  papillae.  Recommendations:  Standard instructions given. High fiber diet. I will contact patient with biopsy results and further recommendations.  Brittanee Ghazarian U  02/02/2015 10:36 AM  CC: Dr. Glo Herring., MD & Dr. Rayne Du ref. provider found

## 2015-02-02 NOTE — H&P (Signed)
Kaitlyn Baker is an 74 y.o. female.   Chief Complaint: Patient is here for colonoscopy. HPI: She is 74 year old Caucasian female who is here for screening colonoscopy. She denies abdominal pain change in bowel habits or rectal bleeding. She has good appetite and her weight has been stable. Last colonoscopy was normal in November 2005. Family history is negative for CRC.  Past Medical History  Diagnosis Date  . HTN (hypertension)   . Dyslipidemia   . Rheumatic fever     as a child  . Hypothyroidism     Past Surgical History  Procedure Laterality Date  . Rotator cuff repair  1991  . Tubal ligation  1973    Family History  Problem Relation Age of Onset  . Coronary artery disease Father 50    cardiac arrest  . Diabetes Father    Social History:  reports that she has never smoked. She does not have any smokeless tobacco history on file. She reports that she drinks alcohol. She reports that she does not use illicit drugs.  Allergies:  Allergies  Allergen Reactions  . Depo-Medrol [Methylprednisolone] Rash    Severe rash and edema  . Moviprep [Peg-Kcl-Nacl-Nasulf-Na Asc-C] Itching    Severe itching all over, itching in the back of the throat, and lip swelling    Medications Prior to Admission  Medication Sig Dispense Refill  . amLODipine-benazepril (LOTREL) 10-20 MG per capsule Take 1 capsule by mouth daily.    Marland Kitchen aspirin 81 MG tablet Take 81 mg by mouth daily.    Marland Kitchen ibuprofen (ADVIL,MOTRIN) 600 MG tablet Take 600 mg by mouth every 6 (six) hours as needed for moderate pain.     Marland Kitchen levothyroxine (SYNTHROID, LEVOTHROID) 25 MCG tablet Take 25 mcg by mouth daily before breakfast.     . peg 3350 powder (MOVIPREP) 100 G SOLR Take 1 kit (200 g total) by mouth once. 1 kit 0    No results found for this or any previous visit (from the past 48 hour(s)). No results found.  ROS  Blood pressure 122/69, pulse 75, temperature 98.8 F (37.1 C), temperature source Oral, resp. rate 13,  height '5\' 6"'  (1.676 m), weight 162 lb (73.483 kg), SpO2 98 %. Physical Exam  Constitutional: She appears well-developed and well-nourished.  HENT:  Mouth/Throat: Oropharynx is clear and moist.  Eyes: Conjunctivae are normal. No scleral icterus.  Neck: No thyromegaly present.  Cardiovascular: Normal rate, regular rhythm and normal heart sounds.   No murmur heard. Respiratory: Effort normal and breath sounds normal.  GI: Soft. She exhibits no distension and no mass. There is no tenderness.  Musculoskeletal: She exhibits no edema.  Lymphadenopathy:    She has no cervical adenopathy.  Neurological: She is alert.  Skin: Skin is warm and dry.     Assessment/Plan Average risk screening colonoscopy.  [name] U 02/02/2015, 9:45 AM

## 2015-02-06 ENCOUNTER — Encounter (HOSPITAL_COMMUNITY): Payer: Self-pay | Admitting: Internal Medicine

## 2015-02-10 ENCOUNTER — Encounter (INDEPENDENT_AMBULATORY_CARE_PROVIDER_SITE_OTHER): Payer: Self-pay | Admitting: *Deleted

## 2015-02-21 DIAGNOSIS — M25512 Pain in left shoulder: Secondary | ICD-10-CM | POA: Diagnosis not present

## 2015-02-23 ENCOUNTER — Telehealth: Payer: Self-pay | Admitting: Cardiovascular Disease

## 2015-02-23 NOTE — Telephone Encounter (Signed)
Pt wants to know if Dr Gwenlyn Found had sent clarence to Dr Noemi Chapel for her surgery? If he have not,please do this asap. Kaitlyn Baker

## 2015-02-23 NOTE — Telephone Encounter (Signed)
Pt. Wants to know if a clearance has been sent to Dr. Noemi Chapel

## 2015-02-24 NOTE — Telephone Encounter (Signed)
I discovered the surgical clearance request form in Dr Kennon Holter mail.  Dr Noemi Chapel would like to proceed with left shoulder arthroscopy with rotator cuff repair with subacromial decompression and DCE. Patient is taking ASA.  I will defer to DOD since Dr Gwenlyn Found is [place] and the patient wished to proceed with surgery quickly.

## 2015-02-24 NOTE — Telephone Encounter (Signed)
Patient is cleared for shoulder surgery from a cardiac standpoint. She may hold ASA for this procedure.  Peter Martinique MD, [facility]

## 2015-02-27 NOTE — Telephone Encounter (Signed)
Patient notified that she is cleared for surgery.  Message routed to [employer].

## 2015-03-10 DIAGNOSIS — G8918 Other acute postprocedural pain: Secondary | ICD-10-CM | POA: Diagnosis not present

## 2015-03-10 DIAGNOSIS — M7552 Bursitis of left shoulder: Secondary | ICD-10-CM | POA: Diagnosis not present

## 2015-03-10 DIAGNOSIS — M7541 Impingement syndrome of right shoulder: Secondary | ICD-10-CM | POA: Diagnosis not present

## 2015-03-10 DIAGNOSIS — M24112 Other articular cartilage disorders, left shoulder: Secondary | ICD-10-CM | POA: Diagnosis not present

## 2015-03-10 DIAGNOSIS — M7542 Impingement syndrome of left shoulder: Secondary | ICD-10-CM | POA: Diagnosis not present

## 2015-03-10 DIAGNOSIS — M75112 Incomplete rotator cuff tear or rupture of left shoulder, not specified as traumatic: Secondary | ICD-10-CM | POA: Diagnosis not present

## 2015-03-14 ENCOUNTER — Encounter (HOSPITAL_COMMUNITY): Payer: Self-pay | Admitting: Occupational Therapy

## 2015-03-14 ENCOUNTER — Ambulatory Visit (HOSPITAL_COMMUNITY): Payer: Medicare Other | Attending: Orthopedic Surgery | Admitting: Occupational Therapy

## 2015-03-14 DIAGNOSIS — M7582 Other shoulder lesions, left shoulder: Secondary | ICD-10-CM | POA: Insufficient documentation

## 2015-03-14 DIAGNOSIS — M6289 Other specified disorders of muscle: Secondary | ICD-10-CM

## 2015-03-14 DIAGNOSIS — Z9889 Other specified postprocedural states: Secondary | ICD-10-CM

## 2015-03-14 DIAGNOSIS — M75102 Unspecified rotator cuff tear or rupture of left shoulder, not specified as traumatic: Secondary | ICD-10-CM | POA: Insufficient documentation

## 2015-03-14 DIAGNOSIS — M25612 Stiffness of left shoulder, not elsewhere classified: Secondary | ICD-10-CM

## 2015-03-14 DIAGNOSIS — M25811 Other specified joint disorders, right shoulder: Secondary | ICD-10-CM | POA: Insufficient documentation

## 2015-03-14 DIAGNOSIS — M25512 Pain in left shoulder: Secondary | ICD-10-CM

## 2015-03-14 DIAGNOSIS — M75112 Incomplete rotator cuff tear or rupture of left shoulder, not specified as traumatic: Secondary | ICD-10-CM | POA: Diagnosis not present

## 2015-03-14 DIAGNOSIS — M6281 Muscle weakness (generalized): Secondary | ICD-10-CM | POA: Insufficient documentation

## 2015-03-14 DIAGNOSIS — R531 Weakness: Secondary | ICD-10-CM

## 2015-03-14 DIAGNOSIS — M629 Disorder of muscle, unspecified: Secondary | ICD-10-CM

## 2015-03-14 NOTE — Therapy (Signed)
[place], [place], [address] Phone: [phone]   Fax:  [phone]  Occupational Therapy Evaluation  [name] MRN: [id number] Date of Birth: 07/23/1941 Referring Provider:  Elsie Saas, MD  Encounter Date: 03/14/2015      OT End of Session - 03/14/15 1259    Visit Number 1   Number of Visits 36   Date for OT Re-Evaluation 05/13/15  Mini-reassessment 04/12/2015   Authorization Type Medicare A & B   Authorization Time Period Before 10th visit   Authorization - Visit Number 1   Authorization - Number of Visits 10   OT Start Time 1149   OT Stop Time 1220   OT Time Calculation (min) 31 min   Activity Tolerance Patient tolerated treatment well   Behavior During Therapy [facility] for tasks assessed/performed      Past Medical History  Diagnosis Date  . HTN (hypertension)   . Dyslipidemia   . Rheumatic fever     as a child  . Hypothyroidism     Past Surgical History  Procedure Laterality Date  . Rotator cuff repair  1991  . Tubal ligation  1973  . Colonoscopy N/A 02/02/2015    Procedure: COLONOSCOPY;  Surgeon: Rogene Houston, MD;  [facility];  Service: Endoscopy;  Laterality: N/A;  930    There were no vitals filed for this visit.  Visit Diagnosis:  S/P rotator cuff repair  Pain in left shoulder  Tight fascia  Decreased range of motion of shoulder, left  Decreased strength      Subjective Assessment - 03/14/15 1224    Subjective  S: I just had surgery Friday, it's pretty painful at times.    Pertinent History Pt is a 74 y/o female s/p left arthroscopic RCR on 03/10/15. Pt is taking oxycodene along with using ice for pain management. Pt is pain limited in PROM during evaluation, and is to wear her sling for 6 weeks. Protocol from MD is PROM only for 5 weeks, AAROM and progress as tolerated. Dr. Noemi Chapel referred pt to occupational therapy for evaluation and  treatment.    Special Tests FOTO Score: 16/100 (84% impairment)   Patient Stated Goals To be able to use my arm.    Currently in Pain? No/denies           [facility] OT Assessment - 03/14/15 1117    Assessment   Diagnosis s/p left RCR   Onset Date 03/10/15   Prior Therapy None   Precautions   Precautions Shoulder   Type of Shoulder Precautions PROM only for 5 weeks: 5/20-6/24; [employer] and progress as tolerated   sling for 6 weeks 5/20-7/1   Balance Screen   Has the patient fallen in the past 6 months Yes   How many times? 1   Has the patient had a decrease in activity level because of a fear of falling?  No   Is the patient reluctant to leave their home because of a fear of falling?  No   Home  Environment   Family/patient expects to be discharged to: Private residence   Living Arrangements Spouse/significant other   Available Help at Discharge Family   Prior Function   Level of Independence Independent with basic ADLs   Vocation Retired   Leisure primary caregiver for mother, walking   ADL   ADL comments Pt has difficulty with all B/IADL tasks including dressing, bathing, grooming, meal  preparation, reaching into high cabinets, lifting items. Pt is primary caregiver for her mother as well.    Written Expression   Dominant Hand Left   Vision - History   Baseline Vision Wears glasses only for reading   Cognition   Overall Cognitive Status Within Functional Limits for tasks assessed   Tone   Assessment Location Left Upper Extremity   ROM / Strength   AROM / PROM / Strength PROM;AROM;Strength   Palpation   Palpation comment Pt has moderate fascial restrictions in left upper arm. Unable to palpate trapezius/scapularis regions due to dressing.   AROM   Overall AROM Comments Unable to test due to precautions   PROM   Overall PROM Comments Assessed in supine, ER/IR adducted   PROM Assessment Site Shoulder   Right/Left Shoulder Left   Left Shoulder Flexion 40 Degrees   Left  Shoulder ABduction 45 Degrees   Left Shoulder Internal Rotation 90 Degrees   Left Shoulder External Rotation --  unable to test due to pain   Strength   Overall Strength Comments Unable to test due to precautions            OT Short Term Goals - 03/14/15 1304    OT SHORT TERM GOAL #1   Title Pt will be educated on HEP.    Time 6   Period Weeks   Status New   OT SHORT TERM GOAL #2   Title Pt will decrease pain to 4/10 during daily activities.    Time 6   Period Weeks   Status New   OT SHORT TERM GOAL #3   Title Pt will decrease fascial restrictions from mod to min amounts.    Time 6   Period Weeks   Status New   OT SHORT TERM GOAL #4   Title Pt will increase PROM to [facility] to increase ability to participate in donning shirts.    Time 6   Period Weeks   Status New   OT SHORT TERM GOAL #5   Title Pt will increase strength to 3-/5 to increase ability to increase ability to assist in performing daily tasks.    Time 6   Period Weeks   Status New           OT Long Term Goals - 03/14/15 1306    OT LONG TERM GOAL #1   Title Pt will return to highest level of functioning and independence in all daily and leisure tasks.    Time 12   Period Weeks   Status New   OT LONG TERM GOAL #2   Title Pt will decrease pain to 1/10 or less during daily tasks.    Time 12   Period Weeks   Status New   OT LONG TERM GOAL #3   Title Pt will decrease fascial restrictions from min to trace amounts or less.    Time 12   Period Weeks   Status New   OT LONG TERM GOAL #4   Title Pt will increase AROM to WNL to increase ability to reach into overhead cabinets.    Time 12   Period Weeks   Status New   OT LONG TERM GOAL #5   Title Pt will increase strength to 4+/5 to improve ability to care fo mother.    Time 12   Period Weeks   Status New               Plan - 03/14/15 1259  Clinical Impression Statement A: Pt is a 74 y/o female s/p left arthroscopic RCR on 03/10/15. Pt  presents with increased pain and fascial restrictions, decreased range of motion and strength, limiting ability to participate in and complete B/IADL tasks. Pt reports minimal pain when sedentary, increasing with movement. Pt pain limited and hesitant to move arm during evaluation, therefore no HEP sent with pt this date. Dr. Noemi Chapel sent protocol to follow, suggesting therapy 3x/week for 12 weeks.    Pt will benefit from skilled therapeutic intervention in order to improve on the following deficits (Retired) Decreased strength;Pain;Impaired UE functional use;Decreased activity tolerance;Decreased range of motion;Increased fascial restricitons;Impaired flexibility   Rehab Potential Good   OT Frequency 3x / week   OT Duration 12 weeks   OT Treatment/Interventions Self-care/ADL training;Passive range of motion;Patient/family education;Electrical Stimulation;Contrast Bath;Moist Heat;Therapeutic exercise;Manual Therapy;Therapeutic activities   Plan P: Pt would benefit from skilled OT services to decrease pain and fascial restrictions, increase range of motion and strength, and increase overall use of LUE as dominant hand/arm. Treatment plan: Myofascial release, manual therapy, PROM, AAROM, AROM, and LUE strengthening. NEXT SESSION: Provide and educate on table slides HEP.    Consulted and Agree with Plan of Care Patient          G-Codes - 03-31-2015 1310    Functional Assessment Tool Used FOTO Score: 16/100 (84% impairment)   Functional Limitation Carrying, moving and handling objects   Carrying, Moving and Handling Objects Current Status (G1829) At least 80 percent but less than 100 percent impaired, limited or restricted   Carrying, Moving and Handling Objects Goal Status (H3716) At least 20 percent but less than 40 percent impaired, limited or restricted      Problem List Patient Active Problem List   Diagnosis Date Noted  . HTN (hypertension) 07/30/2013  . Dyslipidemia 07/30/2013  . Chest  pain- low risk Myoview 8/13 07/30/2013    Guadelupe Sabin, OTR/L  [phone]  March 31, 2015, 1:11 PM  [place] [address] [place], [place], [address] Phone: [phone]   Fax:  [phone]

## 2015-03-16 ENCOUNTER — Encounter (HOSPITAL_COMMUNITY): Payer: Self-pay | Admitting: Occupational Therapy

## 2015-03-16 ENCOUNTER — Ambulatory Visit (HOSPITAL_COMMUNITY): Payer: Medicare Other | Admitting: Occupational Therapy

## 2015-03-16 DIAGNOSIS — M25512 Pain in left shoulder: Secondary | ICD-10-CM | POA: Diagnosis not present

## 2015-03-16 DIAGNOSIS — Z9889 Other specified postprocedural states: Secondary | ICD-10-CM | POA: Diagnosis not present

## 2015-03-16 DIAGNOSIS — M6289 Other specified disorders of muscle: Secondary | ICD-10-CM

## 2015-03-16 DIAGNOSIS — M25612 Stiffness of left shoulder, not elsewhere classified: Secondary | ICD-10-CM

## 2015-03-16 DIAGNOSIS — M629 Disorder of muscle, unspecified: Secondary | ICD-10-CM

## 2015-03-16 DIAGNOSIS — M6281 Muscle weakness (generalized): Secondary | ICD-10-CM | POA: Diagnosis not present

## 2015-03-16 DIAGNOSIS — M7582 Other shoulder lesions, left shoulder: Secondary | ICD-10-CM | POA: Diagnosis not present

## 2015-03-16 DIAGNOSIS — M25811 Other specified joint disorders, right shoulder: Secondary | ICD-10-CM | POA: Diagnosis not present

## 2015-03-16 DIAGNOSIS — R531 Weakness: Secondary | ICD-10-CM

## 2015-03-16 DIAGNOSIS — M75102 Unspecified rotator cuff tear or rupture of left shoulder, not specified as traumatic: Secondary | ICD-10-CM | POA: Diagnosis not present

## 2015-03-16 NOTE — Therapy (Signed)
[place], [place], [address] Phone: [phone]   Fax:  [phone]  Occupational Therapy Treatment  [name] MRN: [id number] Date of Birth: 1941/04/17 Referring Provider:  Redmond School, MD  Encounter Date: 03/16/2015      OT End of Session - 03/16/15 1149    Visit Number 2   Number of Visits 36   Date for OT Re-Evaluation 05/13/15  Mini-reassessment 04/12/2015   Authorization Type Medicare A & B   Authorization Time Period Before 10th visit   Authorization - Visit Number 2   Authorization - Number of Visits 10   OT Start Time 0930   OT Stop Time 1016   OT Time Calculation (min) 46 min   Activity Tolerance Patient tolerated treatment well   Behavior During Therapy [facility] for tasks assessed/performed      Past Medical History  Diagnosis Date  . HTN (hypertension)   . Dyslipidemia   . Rheumatic fever     as a child  . Hypothyroidism     Past Surgical History  Procedure Laterality Date  . Rotator cuff repair  1991  . Tubal ligation  1973  . Colonoscopy N/A 02/02/2015    Procedure: COLONOSCOPY;  Surgeon: Rogene Houston, MD;  [facility];  Service: Endoscopy;  Laterality: N/A;  930    There were no vitals filed for this visit.  Visit Diagnosis:  Pain in left shoulder  Tight fascia  Decreased range of motion of shoulder, left  Decreased strength      Subjective Assessment - 03/16/15 0929    Subjective  S: The doctor took my stitches out, and he said I can take a shower.    Currently in Pain? Yes   Pain Score 2    Pain Location Shoulder   Pain Orientation Left   Pain Descriptors / Indicators Aching   Pain Type Acute pain            [facility] OT Assessment - 03/16/15 0928    Assessment   Diagnosis s/p left RCR   Precautions   Precautions Shoulder   Type of Shoulder Precautions PROM only for 5 weeks: 5/20-6/24; [employer] and progress as tolerated             OT Treatments/Exercises (OP) - 03/16/15 1145    Exercises   Exercises Shoulder   Shoulder Exercises: Supine   Protraction PROM;10 reps   Horizontal ABduction PROM;10 reps   External Rotation PROM;10 reps   Internal Rotation PROM;10 reps   Flexion PROM;10 reps   ABduction PROM;10 reps   Shoulder Exercises: Standing   Other Standing Exercises pendulum exercises, 10X each-flexion, horizontal ab/adduction, circles clockwise & counterclockwise   Manual Therapy   Manual Therapy Myofascial release   Myofascial Release Myofascial release to left bicep, upper arm, deltoid, trapezius, and scapularis regions to decrease pain and fascial restrictions and increase joint mobility.             OT Education - 03/16/15 1148    Education provided Yes   Education Details Pendulum exercises   Person(s) Educated Patient   Methods Explanation;Demonstration;Handout   Comprehension Verbalized understanding;Returned demonstration          OT Short Term Goals - 03/16/15 1152    OT SHORT TERM GOAL #1   Title Pt will be educated on HEP.    Time 6   Period Weeks   Status On-going  OT SHORT TERM GOAL #2   Title Pt will decrease pain to 4/10 during daily activities.    Time 6   Period Weeks   Status On-going   OT SHORT TERM GOAL #3   Title Pt will decrease fascial restrictions from mod to min amounts.    Time 6   Period Weeks   Status On-going   OT SHORT TERM GOAL #4   Title Pt will increase PROM to [facility] to increase ability to participate in donning shirts.    Time 6   Period Weeks   Status On-going   OT SHORT TERM GOAL #5   Title Pt will increase strength to 3-/5 to increase ability to increase ability to assist in performing daily tasks.    Time 6   Period Weeks   Status On-going           OT Long Term Goals - 03/16/15 1152    OT LONG TERM GOAL #1   Title Pt will return to highest level of functioning and independence in all daily and leisure tasks.    Time 12    Period Weeks   Status On-going   OT LONG TERM GOAL #2   Title Pt will decrease pain to 1/10 or less during daily tasks.    Time 12   Period Weeks   Status On-going   OT LONG TERM GOAL #3   Title Pt will decrease fascial restrictions from min to trace amounts or less.    Time 12   Period Weeks   Status On-going   OT LONG TERM GOAL #4   Title Pt will increase AROM to WNL to increase ability to reach into overhead cabinets.    Time 12   Period Weeks   Status On-going   OT LONG TERM GOAL #5   Title Pt will increase strength to 4+/5 to improve ability to care fo mother.    Time 12   Period Weeks   Status On-going               Plan - 03/16/15 1149    Clinical Impression Statement A: Initiated myofascial release and PROM exercises this date. Provided pt with and educated pt on pendulum exercises. Provided pt with copy of evaluation. Pt reports less pain since evaluation.    Plan P: Continue PROM, add table slides during session.         Problem List Patient Active Problem List   Diagnosis Date Noted  . HTN (hypertension) 07/30/2013  . Dyslipidemia 07/30/2013  . Chest pain- low risk Myoview 8/13 07/30/2013    Guadelupe Sabin, OTR/L  [phone]  03/16/2015, 11:53 AM  [place], [place], [address] Phone: [phone]   Fax:  [phone]

## 2015-03-22 ENCOUNTER — Ambulatory Visit (HOSPITAL_COMMUNITY): Payer: Medicare Other | Attending: Orthopedic Surgery | Admitting: Specialist

## 2015-03-22 DIAGNOSIS — M6281 Muscle weakness (generalized): Secondary | ICD-10-CM | POA: Insufficient documentation

## 2015-03-22 DIAGNOSIS — M75102 Unspecified rotator cuff tear or rupture of left shoulder, not specified as traumatic: Secondary | ICD-10-CM | POA: Diagnosis not present

## 2015-03-22 DIAGNOSIS — M7582 Other shoulder lesions, left shoulder: Secondary | ICD-10-CM | POA: Diagnosis not present

## 2015-03-22 DIAGNOSIS — M25811 Other specified joint disorders, right shoulder: Secondary | ICD-10-CM | POA: Diagnosis not present

## 2015-03-22 DIAGNOSIS — M25612 Stiffness of left shoulder, not elsewhere classified: Secondary | ICD-10-CM

## 2015-03-22 DIAGNOSIS — Z9889 Other specified postprocedural states: Secondary | ICD-10-CM | POA: Diagnosis not present

## 2015-03-22 DIAGNOSIS — M6289 Other specified disorders of muscle: Secondary | ICD-10-CM

## 2015-03-22 DIAGNOSIS — M25512 Pain in left shoulder: Secondary | ICD-10-CM | POA: Insufficient documentation

## 2015-03-22 DIAGNOSIS — M629 Disorder of muscle, unspecified: Secondary | ICD-10-CM

## 2015-03-22 NOTE — Patient Instructions (Signed)
COMPLETE 1-3 MINUTES EACH, 2-3 TIMES PER DAY.   SHOULDER: Flexion On Table   Place hands on table, elbows straight. Slide arms forward. Press hands down into table. Hold ___ seconds. ___ reps per set, ___ sets per day, ___ days per week  Abduction (Passive)   With arm out to side palm down, resting on table, slide arm forward on the table. Hold ____ seconds. Repeat ____ times. Do ____ sessions per day.  Copyright  VHI. All rights reserved.     Internal Rotation (Assistive)   Seated with elbow bent at right angle and held against side, slide arm on table surface in an inward arc. Repeat ____ times. Do ____ sessions per day. Activity: Use this motion to brush crumbs off the table.  Copyright  VHI. All rights reserved.

## 2015-03-22 NOTE — Therapy (Signed)
[place], [place], [address] Phone: [phone]   Fax:  [phone]  Occupational Therapy Treatment  [name] MRN: [id number] Date of Birth: [birth year] Referring Provider:  Elsie Saas, MD  Encounter Date: 03/22/2015      OT End of Session - 03/22/15 1002    Visit Number 3   Number of Visits 36   Date for OT Re-Evaluation 05/13/15  mini reassess on 6/22   Authorization Type Medicare A & B   Authorization Time Period Before 10th visit   Authorization - Visit Number 3   Authorization - Number of Visits 10   OT Start Time [id number]   OT Stop Time 0940   OT Time Calculation (min) 50 min   Activity Tolerance Patient tolerated treatment well   Behavior During Therapy [facility] for tasks assessed/performed      Past Medical History  Diagnosis Date  . HTN (hypertension)   . Dyslipidemia   . Rheumatic fever     as a child  . Hypothyroidism     Past Surgical History  Procedure Laterality Date  . Rotator cuff repair  1991  . Tubal ligation  1973  . Colonoscopy N/A 02/02/2015    Procedure: COLONOSCOPY;  Surgeon: Rogene Houston, MD;  [facility];  Service: Endoscopy;  Laterality: N/A;  930    There were no vitals filed for this visit.  Visit Diagnosis:  Pain in left shoulder  Tight fascia  Decreased range of motion of shoulder, left      Subjective Assessment - 03/22/15 0856    Subjective  S:  I cant get comfortable at night.  Do you have any suggestions? - discussed sleeping in various posiitons   Pertinent History . Pt is pain limited in PROM during evaluation, and is to wear her sling for 6 weeks. Protocol from MD is PROM only for 5 weeks, AAROM and progress as tolerated. Dr. Noemi Chapel referred pt to occupational therapy for evaluation and treatment.    Currently in Pain? No/denies            [facility] OT Assessment - 03/22/15 0001    Assessment   Diagnosis s/p  left RCR   Onset Date 03/10/15   Precautions   Precautions Shoulder   Type of Shoulder Precautions PROM only for 5 weeks: 5/20-6/24; [employer] and progress as tolerated                   OT Treatments/Exercises (OP) - 03/22/15 0001    Exercises   Exercises Shoulder   Shoulder Exercises: Supine   Protraction PROM;10 reps   Horizontal ABduction PROM;10 reps   External Rotation PROM;10 reps   Internal Rotation PROM;10 reps   Flexion PROM;10 reps   ABduction PROM;10 reps   Other Supine Exercises bridges 15 times   Shoulder Exercises: Seated   Elevation AROM;10 reps   Extension AROM;10 reps   Row AROM;10 reps   Shoulder Exercises: Isometric Strengthening   Flexion Supine;3X3"   Extension Supine;3X3"   External Rotation Supine;3X3"   Internal Rotation Supine;3X3"   ABduction Supine;3X3"   ADduction Supine;3X3"   Shoulder Exercises: Stretch   Table Stretch - Flexion 5 reps   Table Stretch - Abduction 5 reps   Table Stretch - External Rotation 5 reps   Manual Therapy   Manual Therapy Myofascial release   Myofascial Release MFR to left upper arm, scapular, trapezius, sternocleidomastoid, and shoulder  region to decrease pain and restrictions and improve pain free mobility in her left shoulder                OT Education - 03/22/15 1002    Education provided Yes   Education Details towel slides   Person(s) Educated Patient   Methods Explanation;Demonstration;Handout   Comprehension Verbalized understanding;Returned demonstration          OT Short Term Goals - 03/16/15 1152    OT SHORT TERM GOAL #1   Title Pt will be educated on HEP.    Time 6   Period Weeks   Status On-going   OT SHORT TERM GOAL #2   Title Pt will decrease pain to 4/10 during daily activities.    Time 6   Period Weeks   Status On-going   OT SHORT TERM GOAL #3   Title Pt will decrease fascial restrictions from mod to min amounts.    Time 6   Period Weeks   Status On-going   OT  SHORT TERM GOAL #4   Title Pt will increase PROM to [facility] to increase ability to participate in donning shirts.    Time 6   Period Weeks   Status On-going   OT SHORT TERM GOAL #5   Title Pt will increase strength to 3-/5 to increase ability to increase ability to assist in performing daily tasks.    Time 6   Period Weeks   Status On-going           OT Long Term Goals - 03/16/15 1152    OT LONG TERM GOAL #1   Title Pt will return to highest level of functioning and independence in all daily and leisure tasks.    Time 12   Period Weeks   Status On-going   OT LONG TERM GOAL #2   Title Pt will decrease pain to 1/10 or less during daily tasks.    Time 12   Period Weeks   Status On-going   OT LONG TERM GOAL #3   Title Pt will decrease fascial restrictions from min to trace amounts or less.    Time 12   Period Weeks   Status On-going   OT LONG TERM GOAL #4   Title Pt will increase AROM to WNL to increase ability to reach into overhead cabinets.    Time 12   Period Weeks   Status On-going   OT LONG TERM GOAL #5   Title Pt will increase strength to 4+/5 to improve ability to care fo mother.    Time 12   Period Weeks   Status On-going               Plan - 03/22/15 1003    Clinical Impression Statement A:  Added towel slides to HEP.  Patient begain isometric strengthening this date.  Continues to exhibit considerable guarding during PROM of left shoulder.   Plan P:  Follow up on HEP, improve PROM by 10 degrees, decreasing amount of guarding with PROM.   Consulted and Agree with Plan of Care Patient        Problem List Patient Active Problem List   Diagnosis Date Noted  . HTN (hypertension) 07/30/2013  . Dyslipidemia 07/30/2013  . Chest pain- low risk Myoview 8/13 07/30/2013    Vangie Bicker, OTR/L [phone]  03/22/2015, 10:06 AM  [place], [place], [address] Phone: [phone]    Fax:  [phone]    

## 2015-03-24 ENCOUNTER — Ambulatory Visit (HOSPITAL_COMMUNITY): Payer: Medicare Other | Admitting: Occupational Therapy

## 2015-03-24 ENCOUNTER — Encounter (HOSPITAL_COMMUNITY): Payer: Self-pay | Admitting: Occupational Therapy

## 2015-03-24 DIAGNOSIS — M25512 Pain in left shoulder: Secondary | ICD-10-CM

## 2015-03-24 DIAGNOSIS — Z9889 Other specified postprocedural states: Secondary | ICD-10-CM | POA: Diagnosis not present

## 2015-03-24 DIAGNOSIS — M6289 Other specified disorders of muscle: Secondary | ICD-10-CM

## 2015-03-24 DIAGNOSIS — R531 Weakness: Secondary | ICD-10-CM

## 2015-03-24 DIAGNOSIS — M25811 Other specified joint disorders, right shoulder: Secondary | ICD-10-CM | POA: Diagnosis not present

## 2015-03-24 DIAGNOSIS — M25612 Stiffness of left shoulder, not elsewhere classified: Secondary | ICD-10-CM

## 2015-03-24 DIAGNOSIS — M75102 Unspecified rotator cuff tear or rupture of left shoulder, not specified as traumatic: Secondary | ICD-10-CM | POA: Diagnosis not present

## 2015-03-24 DIAGNOSIS — M6281 Muscle weakness (generalized): Secondary | ICD-10-CM | POA: Diagnosis not present

## 2015-03-24 DIAGNOSIS — M7582 Other shoulder lesions, left shoulder: Secondary | ICD-10-CM | POA: Diagnosis not present

## 2015-03-24 DIAGNOSIS — M629 Disorder of muscle, unspecified: Secondary | ICD-10-CM

## 2015-03-24 NOTE — Therapy (Signed)
[place], [place], [address] Phone: [phone]   Fax:  [phone]  Occupational Therapy Treatment  [name] MRN: [id number] Date of Birth: Sep 17, 1941 Referring Provider:  Elsie Saas, MD  Encounter Date: 03/24/2015      OT End of Session - 03/24/15 1158    Visit Number 4   Number of Visits 36   Date for OT Re-Evaluation 05/13/15  mini reassess on 6/22   Authorization Type Medicare A & B   Authorization Time Period Before 10th visit   Authorization - Visit Number 4   Authorization - Number of Visits 10   OT Start Time 1102   OT Stop Time 1148   OT Time Calculation (min) 46 min   Activity Tolerance Patient tolerated treatment well   Behavior During Therapy [facility] for tasks assessed/performed      Past Medical History  Diagnosis Date  . HTN (hypertension)   . Dyslipidemia   . Rheumatic fever     as a child  . Hypothyroidism     Past Surgical History  Procedure Laterality Date  . Rotator cuff repair  1991  . Tubal ligation  1973  . Colonoscopy N/A 02/02/2015    Procedure: COLONOSCOPY;  Surgeon: Rogene Houston, MD;  [facility];  Service: Endoscopy;  Laterality: N/A;  930    There were no vitals filed for this visit.  Visit Diagnosis:  Pain in left shoulder  Tight fascia  Decreased range of motion of shoulder, left  Decreased strength      Subjective Assessment - 03/24/15 1100    Subjective  S: The suggestions with the pillows that she gave me last time made all the difference in the world with sleeping.    Currently in Pain? No/denies            [facility] OT Assessment - 03/24/15 1100    Assessment   Diagnosis s/p left RCR   Precautions   Precautions Shoulder   Type of Shoulder Precautions PROM only for 5 weeks: 5/20-6/24; [employer] and progress as tolerated                   OT Treatments/Exercises (OP) - 03/24/15 1104    Exercises   Exercises Shoulder   Shoulder Exercises: Supine   Protraction PROM;10 reps   Horizontal ABduction PROM;10 reps   External Rotation PROM;10 reps   Internal Rotation PROM;10 reps   Flexion PROM;10 reps   ABduction PROM;10 reps   Shoulder Exercises: Seated   Elevation AROM;10 reps   Extension AROM;10 reps   Row AROM;10 reps   Shoulder Exercises: Therapy Ball   Flexion 10 reps   ABduction 10 reps   Shoulder Exercises: Isometric Strengthening   Flexion Supine;3X3"   Extension Supine;3X3"   External Rotation Supine;3X3"   Internal Rotation Supine;3X3"   ABduction Supine;3X3"   ADduction Supine;3X3"   Manual Therapy   Manual Therapy Myofascial release   Myofascial Release MFR to left upper arm, scapular, trapezius, sternocleidomastoid, and shoulder region to decrease pain and restrictions and improve pain free mobility in her left shoulder                  OT Short Term Goals - 03/16/15 1152    OT SHORT TERM GOAL #1   Title Pt will be educated on HEP.    Time 6   Period Weeks   Status On-going  OT SHORT TERM GOAL #2   Title Pt will decrease pain to 4/10 during daily activities.    Time 6   Period Weeks   Status On-going   OT SHORT TERM GOAL #3   Title Pt will decrease fascial restrictions from mod to min amounts.    Time 6   Period Weeks   Status On-going   OT SHORT TERM GOAL #4   Title Pt will increase PROM to [facility] to increase ability to participate in donning shirts.    Time 6   Period Weeks   Status On-going   OT SHORT TERM GOAL #5   Title Pt will increase strength to 3-/5 to increase ability to increase ability to assist in performing daily tasks.    Time 6   Period Weeks   Status On-going           OT Long Term Goals - 03/16/15 1152    OT LONG TERM GOAL #1   Title Pt will return to highest level of functioning and independence in all daily and leisure tasks.    Time 12   Period Weeks   Status On-going   OT LONG TERM GOAL #2    Title Pt will decrease pain to 1/10 or less during daily tasks.    Time 12   Period Weeks   Status On-going   OT LONG TERM GOAL #3   Title Pt will decrease fascial restrictions from min to trace amounts or less.    Time 12   Period Weeks   Status On-going   OT LONG TERM GOAL #4   Title Pt will increase AROM to WNL to increase ability to reach into overhead cabinets.    Time 12   Period Weeks   Status On-going   OT LONG TERM GOAL #5   Title Pt will increase strength to 4+/5 to improve ability to care fo mother.    Time 12   Period Weeks   Status On-going               Plan - 03/24/15 1159    Clinical Impression Statement A: Added therapy ball this session. Pt had improved PROM this session, pt was reduce amount of guarding during PROM with verbal cuing. Pt reports she is completing her HEPs and they are going well so far. Pt reports she is sleeping much better since adjusting her position using pillows suggested last session.    Plan P: Continue working to increase PROM, resume bridges. Increase isometrics to 3X5"        Problem List Patient Active Problem List   Diagnosis Date Noted  . HTN (hypertension) 07/30/2013  . Dyslipidemia 07/30/2013  . Chest pain- low risk Myoview 8/13 07/30/2013    Guadelupe Sabin, OTR/L  [phone]  03/24/2015, 12:01 PM  [place] [address] [place], [place], [address] Phone: [phone]   Fax:  [phone]

## 2015-03-27 ENCOUNTER — Encounter (HOSPITAL_COMMUNITY): Payer: Medicare Other

## 2015-03-28 ENCOUNTER — Ambulatory Visit (HOSPITAL_COMMUNITY): Payer: Medicare Other | Admitting: Occupational Therapy

## 2015-03-28 ENCOUNTER — Encounter (HOSPITAL_COMMUNITY): Payer: Self-pay | Admitting: Occupational Therapy

## 2015-03-28 DIAGNOSIS — M25512 Pain in left shoulder: Secondary | ICD-10-CM

## 2015-03-28 DIAGNOSIS — M25811 Other specified joint disorders, right shoulder: Secondary | ICD-10-CM | POA: Diagnosis not present

## 2015-03-28 DIAGNOSIS — M6281 Muscle weakness (generalized): Secondary | ICD-10-CM | POA: Diagnosis not present

## 2015-03-28 DIAGNOSIS — M75102 Unspecified rotator cuff tear or rupture of left shoulder, not specified as traumatic: Secondary | ICD-10-CM | POA: Diagnosis not present

## 2015-03-28 DIAGNOSIS — M6289 Other specified disorders of muscle: Secondary | ICD-10-CM

## 2015-03-28 DIAGNOSIS — M629 Disorder of muscle, unspecified: Secondary | ICD-10-CM

## 2015-03-28 DIAGNOSIS — M25612 Stiffness of left shoulder, not elsewhere classified: Secondary | ICD-10-CM

## 2015-03-28 DIAGNOSIS — M7582 Other shoulder lesions, left shoulder: Secondary | ICD-10-CM | POA: Diagnosis not present

## 2015-03-28 DIAGNOSIS — Z9889 Other specified postprocedural states: Secondary | ICD-10-CM | POA: Diagnosis not present

## 2015-03-28 DIAGNOSIS — R531 Weakness: Secondary | ICD-10-CM

## 2015-03-28 NOTE — Therapy (Signed)
[place], [place], [address] Phone: [phone]   Fax:  [phone]  Occupational Therapy Treatment  [name] MRN: [id number] Date of Birth: 18-Nov-1940 Referring Provider:  Elsie Saas, MD  Encounter Date: 03/28/2015      OT End of Session - 03/28/15 1159    Visit Number 5   Number of Visits 36   Date for OT Re-Evaluation 05/13/15  mini reassess on 6/22   Authorization Type Medicare A & B   Authorization Time Period Before 10th visit   Authorization - Visit Number 5   Authorization - Number of Visits 10   OT Start Time 1105   OT Stop Time 1149   OT Time Calculation (min) 44 min   Activity Tolerance Patient tolerated treatment well   Behavior During Therapy [facility] for tasks assessed/performed      Past Medical History  Diagnosis Date  . HTN (hypertension)   . Dyslipidemia   . Rheumatic fever     as a child  . Hypothyroidism     Past Surgical History  Procedure Laterality Date  . Rotator cuff repair  1991  . Tubal ligation  1973  . Colonoscopy N/A 02/02/2015    Procedure: COLONOSCOPY;  Surgeon: Rogene Houston, MD;  [facility];  Service: Endoscopy;  Laterality: N/A;  930    There were no vitals filed for this visit.  Visit Diagnosis:  Pain in left shoulder  Tight fascia  Decreased range of motion of shoulder, left  Decreased strength      Subjective Assessment - 03/28/15 1104    Subjective  S: It must be feeling better because it feels fine when I lay down.   Currently in Pain? No/denies            [facility] OT Assessment - 03/28/15 1104    Assessment   Diagnosis s/p left RCR   Precautions   Precautions Shoulder   Type of Shoulder Precautions PROM only for 5 weeks: 5/20-6/24; [employer] and progress as tolerated                   OT Treatments/Exercises (OP) - 03/28/15 1107    Exercises   Exercises Shoulder   Shoulder Exercises:  Supine   Protraction PROM;10 reps   Horizontal ABduction PROM;10 reps   External Rotation PROM;10 reps   Internal Rotation PROM;10 reps   Flexion PROM;10 reps   ABduction PROM;10 reps   Other Supine Exercises bridges 15 times   Shoulder Exercises: Seated   Elevation AROM;15 reps   Extension AROM;15 reps   Row AROM;15 reps   Shoulder Exercises: Therapy Ball   Flexion 15 reps   ABduction 15 reps   Shoulder Exercises: Isometric Strengthening   Flexion Supine;3X5"   Extension Supine;3X5"   External Rotation Supine;3X5"   Internal Rotation Supine;3X5"   ABduction Supine;3X5"   ADduction Supine;3X5"   Manual Therapy   Manual Therapy Myofascial release   Myofascial Release MFR to left upper arm, scapular, trapezius, sternocleidomastoid, and shoulder region to decrease pain and restrictions and improve pain free mobility in her left shoulder                  OT Short Term Goals - 03/16/15 1152    OT SHORT TERM GOAL #1   Title Pt will be educated on HEP.    Time 6   Period Weeks   Status On-going  OT SHORT TERM GOAL #2   Title Pt will decrease pain to 4/10 during daily activities.    Time 6   Period Weeks   Status On-going   OT SHORT TERM GOAL #3   Title Pt will decrease fascial restrictions from mod to min amounts.    Time 6   Period Weeks   Status On-going   OT SHORT TERM GOAL #4   Title Pt will increase PROM to [facility] to increase ability to participate in donning shirts.    Time 6   Period Weeks   Status On-going   OT SHORT TERM GOAL #5   Title Pt will increase strength to 3-/5 to increase ability to increase ability to assist in performing daily tasks.    Time 6   Period Weeks   Status On-going           OT Long Term Goals - 03/16/15 1152    OT LONG TERM GOAL #1   Title Pt will return to highest level of functioning and independence in all daily and leisure tasks.    Time 12   Period Weeks   Status On-going   OT LONG TERM GOAL #2   Title Pt will  decrease pain to 1/10 or less during daily tasks.    Time 12   Period Weeks   Status On-going   OT LONG TERM GOAL #3   Title Pt will decrease fascial restrictions from min to trace amounts or less.    Time 12   Period Weeks   Status On-going   OT LONG TERM GOAL #4   Title Pt will increase AROM to WNL to increase ability to reach into overhead cabinets.    Time 12   Period Weeks   Status On-going   OT LONG TERM GOAL #5   Title Pt will increase strength to 4+/5 to improve ability to care fo mother.    Time 12   Period Weeks   Status On-going               Plan - 03/28/15 1159    Clinical Impression Statement A: Increased isometrics to 3X5", resumed bridges, increased AROM and therapy ball repetitions to 15. Pt had increased range during PROM especially abduction where pt achieved approximately 75% range. Pt reports she is completing her HEP which is going well. Pt tolerated treatment well.    Plan P: Continue to work on increasing PROM, esp flexion.         Problem List Patient Active Problem List   Diagnosis Date Noted  . HTN (hypertension) 07/30/2013  . Dyslipidemia 07/30/2013  . Chest pain- low risk Myoview 8/13 07/30/2013    Guadelupe Sabin, OTR/L  [phone]  03/28/2015, 12:03 PM  [place] [address] [place], [place], [address] Phone: [phone]   Fax:  [phone]

## 2015-03-30 ENCOUNTER — Encounter (HOSPITAL_COMMUNITY): Payer: Self-pay

## 2015-03-30 ENCOUNTER — Ambulatory Visit (HOSPITAL_COMMUNITY): Payer: Medicare Other

## 2015-03-30 DIAGNOSIS — Z9889 Other specified postprocedural states: Secondary | ICD-10-CM | POA: Diagnosis not present

## 2015-03-30 DIAGNOSIS — M25512 Pain in left shoulder: Secondary | ICD-10-CM

## 2015-03-30 DIAGNOSIS — M629 Disorder of muscle, unspecified: Secondary | ICD-10-CM

## 2015-03-30 DIAGNOSIS — M25612 Stiffness of left shoulder, not elsewhere classified: Secondary | ICD-10-CM

## 2015-03-30 DIAGNOSIS — R531 Weakness: Secondary | ICD-10-CM

## 2015-03-30 DIAGNOSIS — M6289 Other specified disorders of muscle: Secondary | ICD-10-CM

## 2015-03-30 DIAGNOSIS — M7582 Other shoulder lesions, left shoulder: Secondary | ICD-10-CM | POA: Diagnosis not present

## 2015-03-30 DIAGNOSIS — M25811 Other specified joint disorders, right shoulder: Secondary | ICD-10-CM | POA: Diagnosis not present

## 2015-03-30 DIAGNOSIS — M75102 Unspecified rotator cuff tear or rupture of left shoulder, not specified as traumatic: Secondary | ICD-10-CM | POA: Diagnosis not present

## 2015-03-30 DIAGNOSIS — M6281 Muscle weakness (generalized): Secondary | ICD-10-CM | POA: Diagnosis not present

## 2015-03-30 NOTE — Therapy (Signed)
[place], [place], [address] Phone: [phone]   Fax:  [phone]  Occupational Therapy Treatment  [name] MRN: [id number] Date of Birth: 1941-04-15 Referring Provider:  Redmond School, MD  Encounter Date: 03/30/2015      OT End of Session - 03/30/15 1115    Visit Number 6   Number of Visits 36   Date for OT Re-Evaluation 05/13/15  mini reassess on 6/22   Authorization Type Medicare A & B   Authorization Time Period Before 10th visit   Authorization - Visit Number 6   Authorization - Number of Visits 10   OT Start Time 1020   OT Stop Time 1100   OT Time Calculation (min) 40 min   Activity Tolerance Patient tolerated treatment well   Behavior During Therapy [facility] for tasks assessed/performed      Past Medical History  Diagnosis Date  . HTN (hypertension)   . Dyslipidemia   . Rheumatic fever     as a child  . Hypothyroidism     Past Surgical History  Procedure Laterality Date  . Rotator cuff repair  1991  . Tubal ligation  1973  . Colonoscopy N/A 02/02/2015    Procedure: COLONOSCOPY;  Surgeon: Rogene Houston, MD;  [facility];  Service: Endoscopy;  Laterality: N/A;  930    There were no vitals filed for this visit.  Visit Diagnosis:  Pain in left shoulder  Tight fascia  Decreased strength  Stiffness of shoulder joint, left                    OT Treatments/Exercises (OP) - 03/30/15 1044    Exercises   Exercises Shoulder   Shoulder Exercises: Supine   Protraction PROM;10 reps   Horizontal ABduction PROM;10 reps   External Rotation PROM;10 reps   Internal Rotation PROM;10 reps   Flexion PROM;10 reps   ABduction PROM;10 reps   Other Supine Exercises bridges 15 times   Shoulder Exercises: Seated   Elevation AROM;15 reps   Extension AROM;15 reps   Row AROM;15 reps   Shoulder Exercises: Therapy Ball   Flexion 15 reps   ABduction 15 reps   Shoulder Exercises: Isometric Strengthening   Flexion Supine;3X5"   Extension Supine;3X5"   External Rotation Supine;3X5"   Internal Rotation Supine;3X5"   ABduction Supine;3X5"   ADduction Supine;3X5"   Manual Therapy   Manual Therapy Myofascial release   Myofascial Release Myofascial release to left upper arm, scapular, trapezius, sternocleidomastoid, and shoulder region to decrease pain and restrictions and improve pain free mobility in her left shoulder                  OT Short Term Goals - 03/16/15 1152    OT SHORT TERM GOAL #1   Title Pt will be educated on HEP.    Time 6   Period Weeks   Status On-going   OT SHORT TERM GOAL #2   Title Pt will decrease pain to 4/10 during daily activities.    Time 6   Period Weeks   Status On-going   OT SHORT TERM GOAL #3   Title Pt will decrease fascial restrictions from mod to min amounts.    Time 6   Period Weeks   Status On-going   OT SHORT TERM GOAL #4   Title Pt will increase PROM to [facility] to increase ability to participate in donning  shirts.    Time 6   Period Weeks   Status On-going   OT SHORT TERM GOAL #5   Title Pt will increase strength to 3-/5 to increase ability to increase ability to assist in performing daily tasks.    Time 6   Period Weeks   Status On-going           OT Long Term Goals - 03/16/15 1152    OT LONG TERM GOAL #1   Title Pt will return to highest level of functioning and independence in all daily and leisure tasks.    Time 12   Period Weeks   Status On-going   OT LONG TERM GOAL #2   Title Pt will decrease pain to 1/10 or less during daily tasks.    Time 12   Period Weeks   Status On-going   OT LONG TERM GOAL #3   Title Pt will decrease fascial restrictions from min to trace amounts or less.    Time 12   Period Weeks   Status On-going   OT LONG TERM GOAL #4   Title Pt will increase AROM to WNL to increase ability to reach into overhead cabinets.    Time 12    Period Weeks   Status On-going   OT LONG TERM GOAL #5   Title Pt will increase strength to 4+/5 to improve ability to care fo mother.    Time 12   Period Weeks   Status On-going               Plan - 03/30/15 1116    Clinical Impression Statement A: Pt continues to tolerate PROM to approx. 75% range during flexion, 50% durnig abduction, and 50% of ER. Pain is a limiting factor during passive stretching. patient does stretch further when completing therapy ball stretches.   Plan P: Cont to work on increase PROM within pain tolerance.         Problem List Patient Active Problem List   Diagnosis Date Noted  . HTN (hypertension) 07/30/2013  . Dyslipidemia 07/30/2013  . Chest pain- low risk Myoview 8/13 07/30/2013    Ailene Ravel, OTR/L,CBIS  [phone]  03/30/2015, 11:19 AM  [place], [place], [address] Phone: [phone]   Fax:  [phone]

## 2015-03-31 ENCOUNTER — Ambulatory Visit (HOSPITAL_COMMUNITY): Payer: Medicare Other | Admitting: Occupational Therapy

## 2015-03-31 ENCOUNTER — Encounter (HOSPITAL_COMMUNITY): Payer: Self-pay | Admitting: Occupational Therapy

## 2015-03-31 DIAGNOSIS — M25512 Pain in left shoulder: Secondary | ICD-10-CM

## 2015-03-31 DIAGNOSIS — M25612 Stiffness of left shoulder, not elsewhere classified: Secondary | ICD-10-CM

## 2015-03-31 DIAGNOSIS — M629 Disorder of muscle, unspecified: Secondary | ICD-10-CM

## 2015-03-31 DIAGNOSIS — M6289 Other specified disorders of muscle: Secondary | ICD-10-CM

## 2015-03-31 DIAGNOSIS — M7582 Other shoulder lesions, left shoulder: Secondary | ICD-10-CM | POA: Diagnosis not present

## 2015-03-31 DIAGNOSIS — M75102 Unspecified rotator cuff tear or rupture of left shoulder, not specified as traumatic: Secondary | ICD-10-CM | POA: Diagnosis not present

## 2015-03-31 DIAGNOSIS — R531 Weakness: Secondary | ICD-10-CM

## 2015-03-31 DIAGNOSIS — M6281 Muscle weakness (generalized): Secondary | ICD-10-CM | POA: Diagnosis not present

## 2015-03-31 DIAGNOSIS — Z9889 Other specified postprocedural states: Secondary | ICD-10-CM | POA: Diagnosis not present

## 2015-03-31 DIAGNOSIS — M25811 Other specified joint disorders, right shoulder: Secondary | ICD-10-CM | POA: Diagnosis not present

## 2015-03-31 NOTE — Therapy (Signed)
[place], [place], [address] Phone: [phone]   Fax:  [phone]  Occupational Therapy Treatment  [name] MRN: [id number] Date of Birth: [birth year] Referring Provider:  Elsie Saas, MD  Encounter Date: 03/31/2015      OT End of Session - 03/31/15 1345    Visit Number 7   Number of Visits 36   Date for OT Re-Evaluation 05/13/15  mini reassess on 6/22   Authorization Type Medicare A & B   Authorization Time Period Before 10th visit   Authorization - Visit Number 7   Authorization - Number of Visits 10   OT Start Time 1300   OT Stop Time 1344   OT Time Calculation (min) 44 min   Activity Tolerance Patient tolerated treatment well   Behavior During Therapy [facility] for tasks assessed/performed      Past Medical History  Diagnosis Date  . HTN (hypertension)   . Dyslipidemia   . Rheumatic fever     as a child  . Hypothyroidism     Past Surgical History  Procedure Laterality Date  . Rotator cuff repair  1991  . Tubal ligation  1973  . Colonoscopy N/A 02/02/2015    Procedure: COLONOSCOPY;  Surgeon: Rogene Houston, MD;  [facility];  Service: Endoscopy;  Laterality: N/A;  930    There were no vitals filed for this visit.  Visit Diagnosis:  Pain in left shoulder  Tight fascia  Decreased strength  Stiffness of shoulder joint, left  Decreased range of motion of shoulder, left      Subjective Assessment - 03/31/15 1256    Subjective  S: Sometimes if I'm at home reading I take the sling off and support it on a pillow.    Currently in Pain? No/denies            [facility] OT Assessment - 03/31/15 1256    Assessment   Diagnosis s/p left RCR   Precautions   Precautions Shoulder   Type of Shoulder Precautions PROM only for 5 weeks: 5/20-6/24; [employer] and progress as tolerated                   OT Treatments/Exercises (OP) - 03/31/15 1303    Exercises   Exercises Shoulder   Shoulder Exercises: Supine   Protraction PROM;10 reps   Horizontal ABduction PROM;10 reps   External Rotation PROM;10 reps   Internal Rotation PROM;10 reps   Flexion PROM;10 reps   ABduction PROM;10 reps   Other Supine Exercises bridges 15 times   Shoulder Exercises: Seated   Elevation AROM;15 reps   Extension AROM;15 reps   Row AROM;15 reps   Shoulder Exercises: Therapy Ball   Flexion 15 reps   ABduction 15 reps   Shoulder Exercises: Isometric Strengthening   Flexion Supine;3X5"   Extension Supine;3X5"   External Rotation Supine;3X5"   Internal Rotation Supine;3X5"   ABduction Supine;3X5"   ADduction Supine;3X5"   Manual Therapy   Manual Therapy Myofascial release   Myofascial Release Myofascial release to left upper arm, scapular, trapezius, sternocleidomastoid, and shoulder region to decrease pain and restrictions and improve pain free mobility in her left shoulder                  OT Short Term Goals - 03/16/15 1152    OT SHORT TERM GOAL #1   Title Pt will be educated on HEP.  Time 6   Period Weeks   Status On-going   OT SHORT TERM GOAL #2   Title Pt will decrease pain to 4/10 during daily activities.    Time 6   Period Weeks   Status On-going   OT SHORT TERM GOAL #3   Title Pt will decrease fascial restrictions from mod to min amounts.    Time 6   Period Weeks   Status On-going   OT SHORT TERM GOAL #4   Title Pt will increase PROM to [facility] to increase ability to participate in donning shirts.    Time 6   Period Weeks   Status On-going   OT SHORT TERM GOAL #5   Title Pt will increase strength to 3-/5 to increase ability to increase ability to assist in performing daily tasks.    Time 6   Period Weeks   Status On-going           OT Long Term Goals - 03/16/15 1152    OT LONG TERM GOAL #1   Title Pt will return to highest level of functioning and independence in all daily and leisure tasks.    Time 12    Period Weeks   Status On-going   OT LONG TERM GOAL #2   Title Pt will decrease pain to 1/10 or less during daily tasks.    Time 12   Period Weeks   Status On-going   OT LONG TERM GOAL #3   Title Pt will decrease fascial restrictions from min to trace amounts or less.    Time 12   Period Weeks   Status On-going   OT LONG TERM GOAL #4   Title Pt will increase AROM to WNL to increase ability to reach into overhead cabinets.    Time 12   Period Weeks   Status On-going   OT LONG TERM GOAL #5   Title Pt will increase strength to 4+/5 to improve ability to care fo mother.    Time 12   Period Weeks   Status On-going               Plan - 03/31/15 1346    Clinical Impression Statement A: Pt able to tolerate slight increase in PROM this date. Pt reports she is having less pain at home. Pt is completing HEP.    Plan P: Continue to work on increasing PROM. Increase seated AROM and therapy ball to 20 reps.         Problem List Patient Active Problem List   Diagnosis Date Noted  . HTN (hypertension) 07/30/2013  . Dyslipidemia 07/30/2013  . Chest pain- low risk Myoview 8/13 07/30/2013    Guadelupe Sabin, OTR/L  [phone] 03/31/2015, 2:55 PM  [place] [address] [place], [place], [address] Phone: [phone]   Fax:  [phone]

## 2015-04-03 ENCOUNTER — Encounter (HOSPITAL_COMMUNITY): Payer: Self-pay | Admitting: Occupational Therapy

## 2015-04-03 ENCOUNTER — Ambulatory Visit (HOSPITAL_COMMUNITY): Payer: Medicare Other | Admitting: Occupational Therapy

## 2015-04-03 DIAGNOSIS — M25612 Stiffness of left shoulder, not elsewhere classified: Secondary | ICD-10-CM

## 2015-04-03 DIAGNOSIS — R531 Weakness: Secondary | ICD-10-CM

## 2015-04-03 DIAGNOSIS — M75102 Unspecified rotator cuff tear or rupture of left shoulder, not specified as traumatic: Secondary | ICD-10-CM | POA: Diagnosis not present

## 2015-04-03 DIAGNOSIS — M629 Disorder of muscle, unspecified: Secondary | ICD-10-CM

## 2015-04-03 DIAGNOSIS — M25512 Pain in left shoulder: Secondary | ICD-10-CM

## 2015-04-03 DIAGNOSIS — Z9889 Other specified postprocedural states: Secondary | ICD-10-CM | POA: Diagnosis not present

## 2015-04-03 DIAGNOSIS — M25811 Other specified joint disorders, right shoulder: Secondary | ICD-10-CM | POA: Diagnosis not present

## 2015-04-03 DIAGNOSIS — M6281 Muscle weakness (generalized): Secondary | ICD-10-CM | POA: Diagnosis not present

## 2015-04-03 DIAGNOSIS — M6289 Other specified disorders of muscle: Secondary | ICD-10-CM

## 2015-04-03 DIAGNOSIS — M7582 Other shoulder lesions, left shoulder: Secondary | ICD-10-CM | POA: Diagnosis not present

## 2015-04-03 NOTE — Therapy (Signed)
[place], [place], [address] Phone: [phone]   Fax:  [phone]  Occupational Therapy Treatment  [name] MRN: [id number] Date of Birth: 11-Sep-1941 Referring Provider:  Elsie Saas, MD  Encounter Date: 04/03/2015      OT End of Session - 04/03/15 1147    Visit Number 8   Number of Visits 36   Date for OT Re-Evaluation 05/13/15  mini reassess on 6/22   Authorization Type Medicare A & B   Authorization Time Period Before 10th visit   Authorization - Visit Number 8   Authorization - Number of Visits 10   OT Start Time 1102   OT Stop Time 1146   OT Time Calculation (min) 44 min   Activity Tolerance Patient tolerated treatment well   Behavior During Therapy [facility] for tasks assessed/performed      Past Medical History  Diagnosis Date  . HTN (hypertension)   . Dyslipidemia   . Rheumatic fever     as a child  . Hypothyroidism     Past Surgical History  Procedure Laterality Date  . Rotator cuff repair  1991  . Tubal ligation  1973  . Colonoscopy N/A 02/02/2015    Procedure: COLONOSCOPY;  Surgeon: Rogene Houston, MD;  [facility];  Service: Endoscopy;  Laterality: N/A;  930    There were no vitals filed for this visit.  Visit Diagnosis:  Pain in left shoulder  Tight fascia  Decreased strength  Stiffness of shoulder joint, left  Decreased range of motion of shoulder, left      Subjective Assessment - 04/03/15 1102    Subjective  S: It was aching this morning, but I woke up in an awkward position so that may have been it.    Currently in Pain? No/denies            [facility] OT Assessment - 04/03/15 1102    Assessment   Diagnosis s/p left RCR   Precautions   Precautions Shoulder   Type of Shoulder Precautions PROM only for 5 weeks: 5/20-6/24; [employer] and progress as tolerated                   OT Treatments/Exercises (OP) - 04/03/15  1104    Exercises   Exercises Shoulder   Shoulder Exercises: Supine   Protraction PROM;10 reps   Horizontal ABduction PROM;10 reps   External Rotation PROM;10 reps   Internal Rotation PROM;10 reps   Flexion PROM;10 reps   ABduction PROM;10 reps   Shoulder Exercises: Seated   Elevation AROM;20 reps   Extension AROM;20 reps   Row AROM;20 reps   Shoulder Exercises: Therapy Ball   Flexion 20 reps   ABduction 20 reps   Shoulder Exercises: Isometric Strengthening   Flexion Supine;5X5"   Extension Supine;5X5"   External Rotation Supine;5X5"   Internal Rotation Supine;5X5"   ABduction Supine;5X5"   ADduction Supine;5X5"   Manual Therapy   Manual Therapy Myofascial release   Myofascial Release Myofascial release to left upper arm, scapular, trapezius, sternocleidomastoid, and shoulder region to decrease pain and restrictions and improve pain free mobility in her left shoulder                  OT Short Term Goals - 03/16/15 1152    OT SHORT TERM GOAL #1   Title Pt will be educated on HEP.    Time 6   Period  Weeks   Status On-going   OT SHORT TERM GOAL #2   Title Pt will decrease pain to 4/10 during daily activities.    Time 6   Period Weeks   Status On-going   OT SHORT TERM GOAL #3   Title Pt will decrease fascial restrictions from mod to min amounts.    Time 6   Period Weeks   Status On-going   OT SHORT TERM GOAL #4   Title Pt will increase PROM to [facility] to increase ability to participate in donning shirts.    Time 6   Period Weeks   Status On-going   OT SHORT TERM GOAL #5   Title Pt will increase strength to 3-/5 to increase ability to increase ability to assist in performing daily tasks.    Time 6   Period Weeks   Status On-going           OT Long Term Goals - 03/16/15 1152    OT LONG TERM GOAL #1   Title Pt will return to highest level of functioning and independence in all daily and leisure tasks.    Time 12   Period Weeks   Status On-going   OT  LONG TERM GOAL #2   Title Pt will decrease pain to 1/10 or less during daily tasks.    Time 12   Period Weeks   Status On-going   OT LONG TERM GOAL #3   Title Pt will decrease fascial restrictions from min to trace amounts or less.    Time 12   Period Weeks   Status On-going   OT LONG TERM GOAL #4   Title Pt will increase AROM to WNL to increase ability to reach into overhead cabinets.    Time 12   Period Weeks   Status On-going   OT LONG TERM GOAL #5   Title Pt will increase strength to 4+/5 to improve ability to care fo mother.    Time 12   Period Weeks   Status On-going               Plan - 04/03/15 1147    Clinical Impression Statement A: Increased isometrics to 5X5", therapy ball and seated AROM to 20 repetitions. Pt continues to be pain limited in PROM exercises. Pt is completing HEP and is using heat for tight muscles.    Plan P: UPDATE G-CODE. Follow-up on MD appt.         Problem List Patient Active Problem List   Diagnosis Date Noted  . HTN (hypertension) 07/30/2013  . Dyslipidemia 07/30/2013  . Chest pain- low risk Myoview 8/13 07/30/2013    Guadelupe Sabin, OTR/L  [phone]  04/03/2015, 11:49 AM  [place], [place], [address] Phone: [phone]   Fax:  [phone]

## 2015-04-04 DIAGNOSIS — M25512 Pain in left shoulder: Secondary | ICD-10-CM | POA: Diagnosis not present

## 2015-04-05 ENCOUNTER — Encounter (HOSPITAL_COMMUNITY): Payer: Medicare Other

## 2015-04-06 ENCOUNTER — Ambulatory Visit (HOSPITAL_COMMUNITY): Payer: Medicare Other

## 2015-04-06 ENCOUNTER — Encounter (HOSPITAL_COMMUNITY): Payer: Self-pay

## 2015-04-06 DIAGNOSIS — R531 Weakness: Secondary | ICD-10-CM

## 2015-04-06 DIAGNOSIS — M25512 Pain in left shoulder: Secondary | ICD-10-CM | POA: Diagnosis not present

## 2015-04-06 DIAGNOSIS — M629 Disorder of muscle, unspecified: Secondary | ICD-10-CM

## 2015-04-06 DIAGNOSIS — M75102 Unspecified rotator cuff tear or rupture of left shoulder, not specified as traumatic: Secondary | ICD-10-CM | POA: Diagnosis not present

## 2015-04-06 DIAGNOSIS — M25811 Other specified joint disorders, right shoulder: Secondary | ICD-10-CM | POA: Diagnosis not present

## 2015-04-06 DIAGNOSIS — M6289 Other specified disorders of muscle: Secondary | ICD-10-CM

## 2015-04-06 DIAGNOSIS — M7582 Other shoulder lesions, left shoulder: Secondary | ICD-10-CM | POA: Diagnosis not present

## 2015-04-06 DIAGNOSIS — M25612 Stiffness of left shoulder, not elsewhere classified: Secondary | ICD-10-CM

## 2015-04-06 DIAGNOSIS — Z9889 Other specified postprocedural states: Secondary | ICD-10-CM | POA: Diagnosis not present

## 2015-04-06 DIAGNOSIS — M6281 Muscle weakness (generalized): Secondary | ICD-10-CM | POA: Diagnosis not present

## 2015-04-06 NOTE — Therapy (Addendum)
[place], [place], [address] Phone: [phone]   Fax:  [phone]  Occupational Therapy Treatment  Patient Details  Name: Kaitlyn WHELLER MRN: [id number] Date of Birth: [birth year] Referring Provider:  Elsie Saas, MD  Encounter Date: 04/06/2015      OT End of Session - 04/06/15 1639    Visit Number 9   Number of Visits 36   Date for OT Re-Evaluation 05/13/15  mini reassess on 6/22   Authorization Type Medicare A & B   Authorization Time Period Before 19th visit   Authorization - Visit Number 9   Authorization - Number of Visits 19   OT Start Time 1440   OT Stop Time 1520   OT Time Calculation (min) 40 min   Activity Tolerance Patient tolerated treatment well   Behavior During Therapy [facility] for tasks assessed/performed      Past Medical History  Diagnosis Date  . HTN (hypertension)   . Dyslipidemia   . Rheumatic fever     as a child  . Hypothyroidism     Past Surgical History  Procedure Laterality Date  . Rotator cuff repair  1991  . Tubal ligation  1973  . Colonoscopy N/A 02/02/2015    Procedure: COLONOSCOPY;  Surgeon: Rogene Houston, MD;  [facility];  Service: Endoscopy;  Laterality: N/A;  930    There were no vitals filed for this visit.  Visit Diagnosis:  Pain in left shoulder  Tight fascia  Stiffness of shoulder joint, left  Decreased strength      Subjective Assessment - 04/06/15 1506    Subjective  S: I saw the MD Tuesday and he said I'm doing good. I don't have to wear that wedge in the sling. And I only have to wear the sling for a few more weeks.   Currently in Pain? Yes   Pain Score 1    Pain Location Shoulder   Pain Orientation Left   Pain Descriptors / Indicators Sore   Pain Type Acute pain            [facility] OT Assessment - 04/06/15 1507    Assessment   Diagnosis s/p left RCR   Precautions   Precautions Shoulder   Type of Shoulder Precautions  PROM only for 5 weeks: 5/20-6/24; [employer] and progress as tolerated                   OT Treatments/Exercises (OP) - 04/06/15 1507    Exercises   Exercises Shoulder   Shoulder Exercises: Supine   Protraction PROM;10 reps   Horizontal ABduction PROM;10 reps   External Rotation PROM;10 reps   Internal Rotation PROM;10 reps   Flexion PROM;10 reps   ABduction PROM;10 reps   Shoulder Exercises: Therapy Ball   Flexion 20 reps   ABduction 20 reps   Shoulder Exercises: ROM/Strengthening   Thumb Tacks 1'   Prot/Ret//Elev/Dep 1'   Shoulder Exercises: Isometric Strengthening   Flexion Supine;5X5"   Extension Supine;5X5"   External Rotation Supine;5X5"   Internal Rotation Supine;5X5"   ABduction Supine;5X5"   ADduction Supine;5X5"   Manual Therapy   Manual Therapy Myofascial release   Myofascial Release Myofascial release to left upper arm, scapular, trapezius, sternocleidomastoid, and shoulder region to decrease pain and restrictions and improve pain free mobility in her left shoulder                  OT Short Term  Goals - 03/16/15 1152    OT SHORT TERM GOAL #1   Title Pt will be educated on HEP.    Time 6   Period Weeks   Status On-going   OT SHORT TERM GOAL #2   Title Pt will decrease pain to 4/10 during daily activities.    Time 6   Period Weeks   Status On-going   OT SHORT TERM GOAL #3   Title Pt will decrease fascial restrictions from mod to min amounts.    Time 6   Period Weeks   Status On-going   OT SHORT TERM GOAL #4   Title Pt will increase PROM to [facility] to increase ability to participate in donning shirts.    Time 6   Period Weeks   Status On-going   OT SHORT TERM GOAL #5   Title Pt will increase strength to 3-/5 to increase ability to increase ability to assist in performing daily tasks.    Time 6   Period Weeks   Status On-going           OT Long Term Goals - 03/16/15 1152    OT LONG TERM GOAL #1   Title Pt will return to highest  level of functioning and independence in all daily and leisure tasks.    Time 12   Period Weeks   Status On-going   OT LONG TERM GOAL #2   Title Pt will decrease pain to 1/10 or less during daily tasks.    Time 12   Period Weeks   Status On-going   OT LONG TERM GOAL #3   Title Pt will decrease fascial restrictions from min to trace amounts or less.    Time 12   Period Weeks   Status On-going   OT LONG TERM GOAL #4   Title Pt will increase AROM to WNL to increase ability to reach into overhead cabinets.    Time 12   Period Weeks   Status On-going   OT LONG TERM GOAL #5   Title Pt will increase strength to 4+/5 to improve ability to care fo mother.    Time 12   Period Weeks   Status On-going               Plan - 04/27/15 1640    Clinical Impression Statement A: G code update. Pt reports that her MD appt went well and she is to return in 4 weeks. Pt's bolster was removed from sling. Added thumb tacks and pro/ret/ele/dep. Pt tolerated well.    Plan P: Cont to work on increasing PROM in a pain free zone.         2015-04-27 1031  OT G-codes  Functional Assessment Tool Used FOTO score. 76% impaired  Functional Limitation Carrying, moving and handling objects  Carrying, Moving and Handling Objects Current Status [phone]) CL  Carrying, Moving and Handling Objects Goal Status ([id number]) CJ   Problem List Patient Active Problem List   Diagnosis Date Noted  . HTN (hypertension) 07/30/2013  . Dyslipidemia 07/30/2013  . Chest pain- low risk Myoview 8/13 07/30/2013    Ailene Ravel, OTR/L,CBIS  [phone]  04-27-2015, 5:02 PM  [place], [place], [address] Phone: [phone]   Fax:  [phone]

## 2015-04-07 ENCOUNTER — Ambulatory Visit (HOSPITAL_COMMUNITY): Payer: Medicare Other | Admitting: Occupational Therapy

## 2015-04-07 ENCOUNTER — Encounter (HOSPITAL_COMMUNITY): Payer: Self-pay | Admitting: Occupational Therapy

## 2015-04-07 DIAGNOSIS — M25512 Pain in left shoulder: Secondary | ICD-10-CM | POA: Diagnosis not present

## 2015-04-07 DIAGNOSIS — M75102 Unspecified rotator cuff tear or rupture of left shoulder, not specified as traumatic: Secondary | ICD-10-CM | POA: Diagnosis not present

## 2015-04-07 DIAGNOSIS — M6281 Muscle weakness (generalized): Secondary | ICD-10-CM | POA: Diagnosis not present

## 2015-04-07 DIAGNOSIS — M7582 Other shoulder lesions, left shoulder: Secondary | ICD-10-CM | POA: Diagnosis not present

## 2015-04-07 DIAGNOSIS — M25811 Other specified joint disorders, right shoulder: Secondary | ICD-10-CM | POA: Diagnosis not present

## 2015-04-07 DIAGNOSIS — R531 Weakness: Secondary | ICD-10-CM

## 2015-04-07 DIAGNOSIS — M6289 Other specified disorders of muscle: Secondary | ICD-10-CM

## 2015-04-07 DIAGNOSIS — M25612 Stiffness of left shoulder, not elsewhere classified: Secondary | ICD-10-CM

## 2015-04-07 DIAGNOSIS — M629 Disorder of muscle, unspecified: Secondary | ICD-10-CM

## 2015-04-07 DIAGNOSIS — Z9889 Other specified postprocedural states: Secondary | ICD-10-CM | POA: Diagnosis not present

## 2015-04-07 NOTE — Therapy (Addendum)
[place], [place], [address] Phone: [phone]   Fax:  [phone]  Occupational Therapy Treatment  [name] MRN: [id number] Date of Birth: 05/15/1941 Referring Provider:  Elsie Saas, MD  Encounter Date: 04/07/2015      OT End of Session - 04/07/15 1240    Visit Number 10   Number of Visits 36   Date for OT Re-Evaluation 05/13/15  mini reassess on 6/22   Authorization Type Medicare A & B   Authorization Time Period Before 19th visit   Authorization - Visit Number 10   Authorization - Number of Visits 19   OT Start Time 0932   OT Stop Time 1012   OT Time Calculation (min) 40 min   Activity Tolerance Patient tolerated treatment well   Behavior During Therapy [facility] for tasks assessed/performed      Past Medical History  Diagnosis Date  . HTN (hypertension)   . Dyslipidemia   . Rheumatic fever     as a child  . Hypothyroidism     Past Surgical History  Procedure Laterality Date  . Rotator cuff repair  1991  . Tubal ligation  1973  . Colonoscopy N/A 02/02/2015    Procedure: COLONOSCOPY;  Surgeon: Rogene Houston, MD;  [facility];  Service: Endoscopy;  Laterality: N/A;  930    There were no vitals filed for this visit.  Visit Diagnosis:  Pain in left shoulder  Tight fascia  Stiffness of shoulder joint, left  Decreased strength  Decreased range of motion of shoulder, left      Subjective Assessment - 04/07/15 0935    Subjective  S: I was really hurting yesterday after therapy.    Currently in Pain? No/denies            [facility] OT Assessment - 04/07/15 0934    Assessment   Diagnosis s/p left RCR   Precautions   Precautions Shoulder   Type of Shoulder Precautions PROM only for 5 weeks: 5/20-6/24; [employer] and progress as tolerated           OT Treatments/Exercises (OP) - 04/07/15 0936    Exercises   Exercises Shoulder   Shoulder  Exercises: Supine   Protraction PROM;10 reps   Horizontal ABduction PROM;10 reps   External Rotation PROM;10 reps   Internal Rotation PROM;10 reps   Flexion PROM;10 reps   ABduction PROM;10 reps   Shoulder Exercises: Therapy Ball   Flexion 20 reps   ABduction 20 reps   Shoulder Exercises: Isometric Strengthening   Flexion Supine;5X5"   Extension Supine;5X5"   External Rotation Supine;5X5"   Internal Rotation Supine;5X5"   ABduction Supine;5X5"   ADduction Supine;5X5"   Manual Therapy   Manual Therapy Myofascial release   Myofascial Release Myofascial release to left upper arm, scapular, trapezius, sternocleidomastoid, and shoulder region to decrease pain and restrictions and improve pain free mobility in her left shoulder           OT Short Term Goals - 03/16/15 1152    OT SHORT TERM GOAL #1   Title Pt will be educated on HEP.    Time 6   Period Weeks   Status On-going   OT SHORT TERM GOAL #2   Title Pt will decrease pain to 4/10 during daily activities.    Time 6   Period Weeks   Status On-going   OT SHORT TERM GOAL #3   Title  Pt will decrease fascial restrictions from mod to min amounts.    Time 6   Period Weeks   Status On-going   OT SHORT TERM GOAL #4   Title Pt will increase PROM to [facility] to increase ability to participate in donning shirts.    Time 6   Period Weeks   Status On-going   OT SHORT TERM GOAL #5   Title Pt will increase strength to 3-/5 to increase ability to increase ability to assist in performing daily tasks.    Time 6   Period Weeks   Status On-going           OT Long Term Goals - 03/16/15 1152    OT LONG TERM GOAL #1   Title Pt will return to highest level of functioning and independence in all daily and leisure tasks.    Time 12   Period Weeks   Status On-going   OT LONG TERM GOAL #2   Title Pt will decrease pain to 1/10 or less during daily tasks.    Time 12   Period Weeks   Status On-going   OT LONG TERM GOAL #3   Title Pt  will decrease fascial restrictions from min to trace amounts or less.    Time 12   Period Weeks   Status On-going   OT LONG TERM GOAL #4   Title Pt will increase AROM to WNL to increase ability to reach into overhead cabinets.    Time 12   Period Weeks   Status On-going   OT LONG TERM GOAL #5   Title Pt will increase strength to 4+/5 to improve ability to care fo mother.    Time 12   Period Weeks   Status On-going               Plan - 04/07/15 1240    Clinical Impression Statement A: Pt reports increased soreness after her previous therapy session, no pain today. Pt had slight increase in PROM abduction this session, flexion continues to be pain limited. Pt tolerated treatment well.    Plan P: Continue working to increase PROM          G-Codes - 04-13-15 1641    Functional Assessment Tool Used FOTO score: 24/100 (76% impaired)      Problem List Patient Active Problem List   Diagnosis Date Noted  . HTN (hypertension) 07/30/2013  . Dyslipidemia 07/30/2013  . Chest pain- low risk Myoview 8/13 07/30/2013    Guadelupe Sabin, OTR/L  [phone]  04/07/2015, 12:44 PM  [place], [place], [address] Phone: [phone]   Fax:  [phone]   Occupational Therapy Progress Note  Dates of Reporting Period:  03/14/2015 to 04/07/15  Objective Reports of Subjective Statement: Pt reports no pain this morning, however did have an increase in pain after previous therapy session. Pt reports she is using ice and heat for pain management at home.   Objective Measurements: Pt continues to have pain limited PROM, notably during flexion. Pt did have increase in PROM abduction this session. Pt demonstrates good range of motion during therapy ball exercises. Pt began additional exercises including thumb tacks and prot/ret/elev/dep during previous session and is doing well with those exercises.   Goal Update: Pt is  progressing towards her short term goals as much as is possible within PROM precautions. Pt is reporting a decrease in pain, and we are continuing to work on achieving PROM  WFL. Pt is completing her HEP daily.   Plan: Continue with therapy services following precautions.   Reason Skilled Services are Required: Pt continues to be unable to complete B/IADL task at highest level of functioning due to pain, increased fascial restrictions, decreased range of motion, and decreased strength in LUE.

## 2015-04-10 ENCOUNTER — Ambulatory Visit (HOSPITAL_COMMUNITY): Payer: Medicare Other | Admitting: Specialist

## 2015-04-10 DIAGNOSIS — Z9889 Other specified postprocedural states: Secondary | ICD-10-CM | POA: Diagnosis not present

## 2015-04-10 DIAGNOSIS — M25612 Stiffness of left shoulder, not elsewhere classified: Secondary | ICD-10-CM

## 2015-04-10 DIAGNOSIS — M6289 Other specified disorders of muscle: Secondary | ICD-10-CM

## 2015-04-10 DIAGNOSIS — M25811 Other specified joint disorders, right shoulder: Secondary | ICD-10-CM | POA: Diagnosis not present

## 2015-04-10 DIAGNOSIS — M25512 Pain in left shoulder: Secondary | ICD-10-CM | POA: Diagnosis not present

## 2015-04-10 DIAGNOSIS — M629 Disorder of muscle, unspecified: Secondary | ICD-10-CM

## 2015-04-10 DIAGNOSIS — M7582 Other shoulder lesions, left shoulder: Secondary | ICD-10-CM | POA: Diagnosis not present

## 2015-04-10 DIAGNOSIS — M6281 Muscle weakness (generalized): Secondary | ICD-10-CM | POA: Diagnosis not present

## 2015-04-10 DIAGNOSIS — M75102 Unspecified rotator cuff tear or rupture of left shoulder, not specified as traumatic: Secondary | ICD-10-CM | POA: Diagnosis not present

## 2015-04-10 NOTE — Therapy (Signed)
[place], [place], [address] Phone: [phone]   Fax:  [phone]  Occupational Therapy Treatment  [name] MRN: [id number] Date of Birth: [birth year] Referring Provider:  Elsie Saas, MD  Encounter Date: 04/10/2015      OT End of Session - 04/10/15 1340    Visit Number 11   Number of Visits 36   Date for OT Re-Evaluation 05/13/15  mini reassess on 6/22   Authorization Type Medicare A & B   Authorization Time Period Before 19th visit   Authorization - Visit Number 11   Authorization - Number of Visits 19   OT Start Time 1308   OT Stop Time 1350   OT Time Calculation (min) 42 min   Activity Tolerance Patient tolerated treatment well   Behavior During Therapy [facility] for tasks assessed/performed      Past Medical History  Diagnosis Date  . HTN (hypertension)   . Dyslipidemia   . Rheumatic fever     as a child  . Hypothyroidism     Past Surgical History  Procedure Laterality Date  . Rotator cuff repair  1991  . Tubal ligation  1973  . Colonoscopy N/A 02/02/2015    Procedure: COLONOSCOPY;  Surgeon: Rogene Houston, MD;  [facility];  Service: Endoscopy;  Laterality: N/A;  930    There were no vitals filed for this visit.  Visit Diagnosis:  Pain in left shoulder  Tight fascia  Stiffness of shoulder joint, left      Subjective Assessment - 04/10/15 1308    Subjective  S:  I sleep better when I lay on my left side.   Pertinent History . Pt is pain limited in PROM during evaluation, and is to wear her sling for 6 weeks. Protocol from MD is PROM only for 5 weeks, AAROM and progress as tolerated. Dr. Noemi Chapel referred pt to occupational therapy for evaluation and treatment.    Currently in Pain? No/denies   Pain Score 0-No pain            [facility] OT Assessment - 04/10/15 0001    Assessment   Diagnosis s/p left RCR   Precautions   Precautions Shoulder    Type of Shoulder Precautions PROM only for 5 weeks: 5/20-6/24; [employer] and progress as tolerated                   OT Treatments/Exercises (OP) - 04/10/15 0001    Exercises   Exercises Shoulder   Shoulder Exercises: Supine   Protraction PROM;10 reps   Horizontal ABduction PROM;10 reps   External Rotation PROM;10 reps   External Rotation Limitations painful at 20 degrees    Internal Rotation PROM;10 reps   Flexion PROM;10 reps   ABduction PROM;10 reps   Other Supine Exercises bridges 25   Shoulder Exercises: Seated   Elevation AROM;20 reps   Extension AROM;20 reps   Row AROM;20 reps   Shoulder Exercises: Therapy Ball   Flexion 20 reps   ABduction 20 reps   Shoulder Exercises: ROM/Strengthening   Thumb Tacks 1' with arm positioned below shoulder height for increased comfort.   Prot/Ret//Elev/Dep 1'   Shoulder Exercises: Isometric Strengthening   Flexion Supine;5X5"   Extension Supine;5X5"   External Rotation Supine;5X5"   Internal Rotation Supine;5X5"   ABduction Supine;5X5"   ADduction Supine;5X5"   Manual Therapy   Manual Therapy Myofascial release   Myofascial Release MFR  to left upper arm, scapular, trapezius, sternocleidomastoid, and shoulder region to decrease pain and restrictions and improve pain free mobility in her left shoulder                OT Education - 04/10/15 1340    Education provided Yes   Education Details educated to do any exercises we do in clinic at home, as desired   Person(s) Educated Patient   Methods Explanation   Comprehension Verbalized understanding          OT Short Term Goals - 03/16/15 1152    OT SHORT TERM GOAL #1   Title Pt will be educated on HEP.    Time 6   Period Weeks   Status On-going   OT SHORT TERM GOAL #2   Title Pt will decrease pain to 4/10 during daily activities.    Time 6   Period Weeks   Status On-going   OT SHORT TERM GOAL #3   Title Pt will decrease fascial restrictions from mod to min  amounts.    Time 6   Period Weeks   Status On-going   OT SHORT TERM GOAL #4   Title Pt will increase PROM to [facility] to increase ability to participate in donning shirts.    Time 6   Period Weeks   Status On-going   OT SHORT TERM GOAL #5   Title Pt will increase strength to 3-/5 to increase ability to increase ability to assist in performing daily tasks.    Time 6   Period Weeks   Status On-going           OT Long Term Goals - 03/16/15 1152    OT LONG TERM GOAL #1   Title Pt will return to highest level of functioning and independence in all daily and leisure tasks.    Time 12   Period Weeks   Status On-going   OT LONG TERM GOAL #2   Title Pt will decrease pain to 1/10 or less during daily tasks.    Time 12   Period Weeks   Status On-going   OT LONG TERM GOAL #3   Title Pt will decrease fascial restrictions from min to trace amounts or less.    Time 12   Period Weeks   Status On-going   OT LONG TERM GOAL #4   Title Pt will increase AROM to WNL to increase ability to reach into overhead cabinets.    Time 12   Period Weeks   Status On-going   OT LONG TERM GOAL #5   Title Pt will increase strength to 4+/5 to improve ability to care fo mother.    Time 12   Period Weeks   Status On-going               Plan - 04/10/15 1346    Clinical Impression Statement A:  Patient most limited with ER in supine this date.  Lowered left arm during thumbtack exercise and patient able to complete with much less pain.    Plan P:  Add anterior and caudle glides.  Improve PROM of ER by 10 degrees for increased independence with BADLs.  Complete mini reassessment.    Consulted and Agree with Plan of Care Patient        Problem List Patient Active Problem List   Diagnosis Date Noted  . HTN (hypertension) 07/30/2013  . Dyslipidemia 07/30/2013  . Chest pain- low risk Myoview 8/13 07/30/2013    Romelle Starcher  Ola Spurr, OTR/L [phone]  04/10/2015, 1:49 PM  [place] [address] [place], [place], [address] Phone: [phone]   Fax:  [phone]

## 2015-04-11 ENCOUNTER — Ambulatory Visit (HOSPITAL_COMMUNITY): Payer: Medicare Other

## 2015-04-11 ENCOUNTER — Encounter (HOSPITAL_COMMUNITY): Payer: Self-pay

## 2015-04-11 DIAGNOSIS — M75102 Unspecified rotator cuff tear or rupture of left shoulder, not specified as traumatic: Secondary | ICD-10-CM | POA: Diagnosis not present

## 2015-04-11 DIAGNOSIS — R531 Weakness: Secondary | ICD-10-CM

## 2015-04-11 DIAGNOSIS — M629 Disorder of muscle, unspecified: Secondary | ICD-10-CM

## 2015-04-11 DIAGNOSIS — M25612 Stiffness of left shoulder, not elsewhere classified: Secondary | ICD-10-CM

## 2015-04-11 DIAGNOSIS — M25512 Pain in left shoulder: Secondary | ICD-10-CM | POA: Diagnosis not present

## 2015-04-11 DIAGNOSIS — Z9889 Other specified postprocedural states: Secondary | ICD-10-CM | POA: Diagnosis not present

## 2015-04-11 DIAGNOSIS — M6289 Other specified disorders of muscle: Secondary | ICD-10-CM

## 2015-04-11 DIAGNOSIS — M6281 Muscle weakness (generalized): Secondary | ICD-10-CM | POA: Diagnosis not present

## 2015-04-11 DIAGNOSIS — M7582 Other shoulder lesions, left shoulder: Secondary | ICD-10-CM | POA: Diagnosis not present

## 2015-04-11 DIAGNOSIS — M25811 Other specified joint disorders, right shoulder: Secondary | ICD-10-CM | POA: Diagnosis not present

## 2015-04-11 NOTE — Therapy (Signed)
[place], [place], [address] Phone: [phone]   Fax:  [phone]  Occupational Therapy Treatment  [name] MRN: [id number] Date of Birth: 07-08-1941 Referring Provider:  Elsie Saas, MD  Encounter Date: 04/11/2015      OT End of Session - 04/11/15 1050    Visit Number 12   Number of Visits 36   Date for OT Re-Evaluation 05/13/15   Authorization Type Medicare A & B   Authorization Time Period Before 19th visit   Authorization - Visit Number 12   Authorization - Number of Visits 29   OT Start Time 1020   OT Stop Time 1100   OT Time Calculation (min) 40 min   Activity Tolerance Patient tolerated treatment well   Behavior During Therapy [facility] for tasks assessed/performed      Past Medical History  Diagnosis Date  . HTN (hypertension)   . Dyslipidemia   . Rheumatic fever     as a child  . Hypothyroidism     Past Surgical History  Procedure Laterality Date  . Rotator cuff repair  1991  . Tubal ligation  1973  . Colonoscopy N/A 02/02/2015    Procedure: COLONOSCOPY;  Surgeon: Rogene Houston, MD;  [facility];  Service: Endoscopy;  Laterality: N/A;  930    There were no vitals filed for this visit.  Visit Diagnosis:  Tight fascia  Stiffness of shoulder joint, left  Decreased strength      Subjective Assessment - 04/11/15 1047    Subjective  S: I just feel stiff today.   Special Tests FOTO score: 46/100   Currently in Pain? No/denies            [facility] OT Assessment - 04/11/15 1028    Assessment   Diagnosis s/p left RCR   Precautions   Precautions Shoulder   Type of Shoulder Precautions PROM only for 5 weeks: 5/20-6/24; [place] and progress as tolerated    PROM   Overall PROM Comments Assessed in supine, ER/IR adducted   PROM Assessment Site Shoulder   Right/Left Shoulder Left   Left Shoulder Flexion 132 Degrees  on eval: 40   Left  Shoulder ABduction 98 Degrees  on eval: 45   Left Shoulder Internal Rotation 90 Degrees  same at eval   Left Shoulder External Rotation 72 Degrees  on eval: unable to measure due to pain                  OT Treatments/Exercises (OP) - 04/11/15 1035    Exercises   Exercises Shoulder   Shoulder Exercises: Supine   Protraction PROM;10 reps   Horizontal ABduction PROM;10 reps   External Rotation PROM;10 reps   Internal Rotation PROM;10 reps   Flexion PROM;10 reps   ABduction PROM;10 reps   Other Supine Exercises bridges 25   Shoulder Exercises: Seated   Elevation AROM;20 reps   Extension AROM;20 reps   Row AROM;20 reps   Shoulder Exercises: ROM/Strengthening   Anterior Glide 3x10"   Caudal Glide 3x10"                OT Education - 04/10/15 1340    Education provided Yes   Education Details educated to do any exercises we do in clinic at home, as desired   Person(s) Educated Patient   Methods Explanation   Comprehension Verbalized understanding  OT Short Term Goals - 2015/05/02 1036    OT SHORT TERM GOAL #1   Title Pt will be educated on HEP.    Time 6   Period Weeks   Status Achieved   OT SHORT TERM GOAL #2   Title Pt will decrease pain to 4/10 during daily activities.    Time 6   Period Weeks   Status Achieved   OT SHORT TERM GOAL #3   Title Pt will decrease fascial restrictions from mod to min amounts.    Time 6   Period Weeks   Status Achieved   OT SHORT TERM GOAL #4   Title Pt will increase PROM to [facility] to increase ability to participate in donning shirts.    Time 6   Period Weeks   Status Achieved   OT SHORT TERM GOAL #5   Title Pt will increase strength to 3-/5 to increase ability to increase ability to assist in performing daily tasks.    Time 6   Period Weeks   Status On-going           OT Long Term Goals - 05-02-2015 1038    OT LONG TERM GOAL #1   Title Pt will return to highest level of functioning and independence  in all daily and leisure tasks.    Time 12   Period Weeks   Status On-going   OT LONG TERM GOAL #2   Title Pt will decrease pain to 1/10 or less during daily tasks.    Time 12   Period Weeks   Status On-going   OT LONG TERM GOAL #3   Title Pt will decrease fascial restrictions from min to trace amounts or less.    Time 12   Period Weeks   Status On-going   OT LONG TERM GOAL #4   Title Pt will increase AROM to WNL to increase ability to reach into overhead cabinets.    Time 12   Period Weeks   Status On-going   OT LONG TERM GOAL #5   Title Pt will increase strength to 4+/5 to improve ability to care for mother.    Time 12   Period Weeks   Status On-going               Plan - 05-02-15 1048    Clinical Impression Statement A: Mini reassessment completed this date. Patient has made progress with all shoulder measurements and has met all but one STG. Added anterior and caudel glides. Patient toelrated well. PROM ER improved greatly this date.   Plan P: Attempt AAROM supine and pulleys.          G-Codes - 2015-05-02 1059    Functional Assessment Tool Used FOTO score: 46/100 (54% impaired)   Functional Limitation Carrying, moving and handling objects   Carrying, Moving and Handling Objects Current Status (O5366) At least 40 percent but less than 60 percent impaired, limited or restricted   Carrying, Moving and Handling Objects Goal Status (Y4034) At least 20 percent but less than 40 percent impaired, limited or restricted      Problem List Patient Active Problem List   Diagnosis Date Noted  . HTN (hypertension) 07/30/2013  . Dyslipidemia 07/30/2013  . Chest pain- low risk Myoview 8/13 07/30/2013  Occupational Therapy Progress Note  Dates of Reporting Period: 04/06/15 to 04/10/15  Objective Reports of Subjective Statement: Pt reports that she is able to take sling off next week.   Objective Measurements: See LUE shoulder  PROM measurements above.  Goal Update: See  goals above  Plan: Continue with therapy following protocol and set POC. Progress to [facility] next session.  Reason Skilled Services are Required: Pt is unable to use LUE for any functional task. Limited AROM, PROM, and strength.   Ailene Ravel, OTR/L,CBIS  [phone]  04/11/2015, 11:00 AM  [place] [address] [place], [place], [address] Phone: [phone]   Fax:  [phone]

## 2015-04-13 ENCOUNTER — Encounter (HOSPITAL_COMMUNITY): Payer: Self-pay | Admitting: Occupational Therapy

## 2015-04-13 ENCOUNTER — Ambulatory Visit (HOSPITAL_COMMUNITY): Payer: Medicare Other | Admitting: Occupational Therapy

## 2015-04-13 DIAGNOSIS — M6281 Muscle weakness (generalized): Secondary | ICD-10-CM | POA: Diagnosis not present

## 2015-04-13 DIAGNOSIS — M629 Disorder of muscle, unspecified: Secondary | ICD-10-CM

## 2015-04-13 DIAGNOSIS — M25612 Stiffness of left shoulder, not elsewhere classified: Secondary | ICD-10-CM

## 2015-04-13 DIAGNOSIS — R531 Weakness: Secondary | ICD-10-CM

## 2015-04-13 DIAGNOSIS — M6289 Other specified disorders of muscle: Secondary | ICD-10-CM

## 2015-04-13 DIAGNOSIS — M25811 Other specified joint disorders, right shoulder: Secondary | ICD-10-CM | POA: Diagnosis not present

## 2015-04-13 DIAGNOSIS — M7582 Other shoulder lesions, left shoulder: Secondary | ICD-10-CM | POA: Diagnosis not present

## 2015-04-13 DIAGNOSIS — M25512 Pain in left shoulder: Secondary | ICD-10-CM

## 2015-04-13 DIAGNOSIS — Z9889 Other specified postprocedural states: Secondary | ICD-10-CM | POA: Diagnosis not present

## 2015-04-13 DIAGNOSIS — M75102 Unspecified rotator cuff tear or rupture of left shoulder, not specified as traumatic: Secondary | ICD-10-CM | POA: Diagnosis not present

## 2015-04-13 NOTE — Therapy (Signed)
[place], [place], [address] Phone: [phone]   Fax:  [phone]  Occupational Therapy Treatment  [name] MRN: [id number] Date of Birth: September 25, 1941 Referring Provider:  Redmond School, MD  Encounter Date: 04/13/2015      OT End of Session - 04/13/15 1155    Visit Number 13   Number of Visits 36   Date for OT Re-Evaluation 05/13/15   Authorization Type Medicare A & B   Authorization Time Period Before 29th visit   Authorization - Visit Number 69   Authorization - Number of Visits 29   OT Start Time 1015   OT Stop Time 1101   OT Time Calculation (min) 46 min   Activity Tolerance Patient tolerated treatment well   Behavior During Therapy [facility] for tasks assessed/performed      Past Medical History  Diagnosis Date  . HTN (hypertension)   . Dyslipidemia   . Rheumatic fever     as a child  . Hypothyroidism     Past Surgical History  Procedure Laterality Date  . Rotator cuff repair  1991  . Tubal ligation  1973  . Colonoscopy N/A 02/02/2015    Procedure: COLONOSCOPY;  Surgeon: Rogene Houston, MD;  [facility];  Service: Endoscopy;  Laterality: N/A;  930    There were no vitals filed for this visit.  Visit Diagnosis:  Tight fascia  Stiffness of shoulder joint, left  Decreased strength  Pain in left shoulder  Decreased range of motion of shoulder, left      Subjective Assessment - 04/13/15 1154    Subjective  S: I didn't wear my sling last night because it was making my neck sore.    Currently in Pain? No/denies            [facility] OT Assessment - 04/13/15 1153    Assessment   Diagnosis s/p left RCR   Precautions   Precautions Shoulder   Type of Shoulder Precautions AAROM and progress as tolerated           OT Treatments/Exercises (OP) - 04/13/15 1154    Exercises   Exercises Shoulder   Shoulder Exercises: Supine   Protraction  PROM;AAROM;10 reps   Horizontal ABduction PROM;AAROM;10 reps   External Rotation PROM;AAROM;10 reps   Internal Rotation PROM;AAROM;10 reps   Flexion PROM;AAROM;10 reps   ABduction PROM;AAROM;10 reps   Shoulder Exercises: Pulleys   Flexion 1 minute   ABduction 1 minute   Shoulder Exercises: ROM/Strengthening   Thumb Tacks 1' with arm positioned below shoulder height for increased comfort.   Prot/Ret//Elev/Dep 1'   Manual Therapy   Manual Therapy Myofascial release   Myofascial Release MFR to left upper arm, scapular, trapezius, sternocleidomastoid, and shoulder region to decrease pain and restrictions and improve pain free mobility in her left shoulder            OT Short Term Goals - 04/11/15 1036    OT SHORT TERM GOAL #1   Title Pt will be educated on HEP.    Time 6   Period Weeks   Status Achieved   OT SHORT TERM GOAL #2   Title Pt will decrease pain to 4/10 during daily activities.    Time 6   Period Weeks   Status Achieved   OT SHORT TERM GOAL #3   Title Pt will decrease fascial restrictions from mod to min amounts.    Time  6   Period Weeks   Status Achieved   OT SHORT TERM GOAL #4   Title Pt will increase PROM to [facility] to increase ability to participate in donning shirts.    Time 6   Period Weeks   Status Achieved   OT SHORT TERM GOAL #5   Title Pt will increase strength to 3-/5 to increase ability to increase ability to assist in performing daily tasks.    Time 6   Period Weeks   Status On-going           OT Long Term Goals - 04/11/15 1038    OT LONG TERM GOAL #1   Title Pt will return to highest level of functioning and independence in all daily and leisure tasks.    Time 12   Period Weeks   Status On-going   OT LONG TERM GOAL #2   Title Pt will decrease pain to 1/10 or less during daily tasks.    Time 12   Period Weeks   Status On-going   OT LONG TERM GOAL #3   Title Pt will decrease fascial restrictions from min to trace amounts or less.     Time 12   Period Weeks   Status On-going   OT LONG TERM GOAL #4   Title Pt will increase AROM to WNL to increase ability to reach into overhead cabinets.    Time 12   Period Weeks   Status On-going   OT LONG TERM GOAL #5   Title Pt will increase strength to 4+/5 to improve ability to care for mother.    Time 12   Period Weeks   Status On-going               Plan - 04/13/15 1156    Clinical Impression Statement A: Added AAROM in supine and pulleys. OT noted pt had increased tightness in scapularis region this session. Pt tolerated treatment well, reporting minimal soreness at end of session. Pt reports she took her sling off to sleep due to pain in her neck/back where the strap sits.    Plan P: Add AAROM in standing, provide HEP if appropriate.         Problem List Patient Active Problem List   Diagnosis Date Noted  . HTN (hypertension) 07/30/2013  . Dyslipidemia 07/30/2013  . Chest pain- low risk Myoview 8/13 07/30/2013    Guadelupe Sabin, OTR/L  [phone] 04/13/2015, 11:59 AM  [place], [place], [address] Phone: [phone]   Fax:  [phone]

## 2015-04-17 ENCOUNTER — Encounter (HOSPITAL_COMMUNITY): Payer: Self-pay

## 2015-04-17 ENCOUNTER — Ambulatory Visit (HOSPITAL_COMMUNITY): Payer: Medicare Other

## 2015-04-17 DIAGNOSIS — M6281 Muscle weakness (generalized): Secondary | ICD-10-CM | POA: Diagnosis not present

## 2015-04-17 DIAGNOSIS — Z9889 Other specified postprocedural states: Secondary | ICD-10-CM | POA: Diagnosis not present

## 2015-04-17 DIAGNOSIS — M25612 Stiffness of left shoulder, not elsewhere classified: Secondary | ICD-10-CM

## 2015-04-17 DIAGNOSIS — M25512 Pain in left shoulder: Secondary | ICD-10-CM | POA: Diagnosis not present

## 2015-04-17 DIAGNOSIS — M6289 Other specified disorders of muscle: Secondary | ICD-10-CM

## 2015-04-17 DIAGNOSIS — M629 Disorder of muscle, unspecified: Secondary | ICD-10-CM

## 2015-04-17 DIAGNOSIS — M7582 Other shoulder lesions, left shoulder: Secondary | ICD-10-CM | POA: Diagnosis not present

## 2015-04-17 DIAGNOSIS — R531 Weakness: Secondary | ICD-10-CM

## 2015-04-17 DIAGNOSIS — M75102 Unspecified rotator cuff tear or rupture of left shoulder, not specified as traumatic: Secondary | ICD-10-CM | POA: Diagnosis not present

## 2015-04-17 DIAGNOSIS — M25811 Other specified joint disorders, right shoulder: Secondary | ICD-10-CM | POA: Diagnosis not present

## 2015-04-17 NOTE — Patient Instructions (Signed)
Perform each exercise __10______ reps. 2-3x days.   WAND PRESS - STANDING  Start by holding a wand or cane at chest height.  Next, slowly push the wand outwards in front of your body so that your elbows become fully straightened. Then, return to the original position.     WAND FLEXION - STANDING - PALMS UP  In the standing position, hold a wand/cane with both arms, palms up on both sides. Raise up the wand/cane allowing your unaffected arm to perform most of the effort. Your affected arm should be partially relaxed.      WAND ROTATION - STANDING  In the standing position, hold a wand/cane with both hands keeping your elbows bent. Move your arms and wand/cane to one side.  Your affected arm should be partially relaxed while your unaffected arm performs most of the effort.       WAND ABDUCTION - STANDING  While holding a wand/cane palm face up on the injured side and palm face down on the uninjured side, slowly raise up your injured arm to the side.   Horizontal Abduction/Adduction      Straight arms holding cane at shoulder height, bring cane to right, center, left. Repeat starting to left.   Copyright  V[employer]. All rights reserved.

## 2015-04-17 NOTE — Therapy (Signed)
[place], [place], [address] Phone: [phone]   Fax:  [phone]  Occupational Therapy Treatment  [name] MRN: [id number] Date of Birth: 06/11/1941 Referring Provider:  Elsie Saas, MD  Encounter Date: 04/17/2015      OT End of Session - 04/17/15 1200    Visit Number 14   Number of Visits 36   Date for OT Re-Evaluation 05/13/15   Authorization Type Medicare A & B   Authorization Time Period Before 29th visit   Authorization - Visit Number 14   Authorization - Number of Visits 29   OT Start Time 1100   OT Stop Time 1145   OT Time Calculation (min) 45 min   Activity Tolerance Patient tolerated treatment well   Behavior During Therapy [facility] for tasks assessed/performed      Past Medical History  Diagnosis Date  . HTN (hypertension)   . Dyslipidemia   . Rheumatic fever     as a child  . Hypothyroidism     Past Surgical History  Procedure Laterality Date  . Rotator cuff repair  1991  . Tubal ligation  1973  . Colonoscopy N/A 02/02/2015    Procedure: COLONOSCOPY;  Surgeon: Rogene Houston, MD;  [facility];  Service: Endoscopy;  Laterality: N/A;  930    There were no vitals filed for this visit.  Visit Diagnosis:  Tight fascia  Stiffness of shoulder joint, left  Decreased strength  Pain in left shoulder      Subjective Assessment - 04/17/15 1120    Subjective  S: I took the sling off on Saturday. I don't have to wear it anymore.   Currently in Pain? Yes   Pain Score 1    Pain Location Shoulder   Pain Orientation Left   Pain Descriptors / Indicators Aching   Pain Type Acute pain                      OT Treatments/Exercises (OP) - 04/17/15 1118    Exercises   Exercises Shoulder   Shoulder Exercises: Supine   Protraction PROM;AAROM;10 reps   Horizontal ABduction PROM;AAROM;10 reps   External Rotation PROM;AAROM;10 reps   Internal Rotation PROM;AAROM;10 reps   Flexion PROM;AAROM;10 reps   ABduction PROM;AAROM;10 reps   Shoulder Exercises: Standing   Protraction AAROM;10 reps   Horizontal ABduction AAROM;10 reps   External Rotation AAROM;10 reps   Internal Rotation AAROM;10 reps   Flexion AAROM;10 reps   ABduction AAROM;10 reps   Shoulder Exercises: Pulleys   Flexion 1 minute   ABduction 1 minute   Shoulder Exercises: ROM/Strengthening   Wall Wash 1'   Thumb Tacks 1'    Proximal Shoulder Strengthening, Supine 10X no rest breaks   Proximal Shoulder Strengthening, Seated 10X    Manual Therapy   Manual Therapy Myofascial release   Myofascial Release Myofascial release to left upper arm, scapular, trapezius, sternocleidomastoid, and shoulder region to decrease pain and restrictions and improve pain free mobility in her left shoulder                OT Education - 04/17/15 1133    Education provided Yes   Education Details AAROM exercises   Person(s) Educated Patient   Methods Explanation;Demonstration;Handout   Comprehension Verbalized understanding;Returned demonstration          OT Short Term Goals - 04/17/15 1201    OT SHORT TERM  GOAL #1   Title Pt will be educated on HEP.    Time 6   Period Weeks   OT SHORT TERM GOAL #2   Title Pt will decrease pain to 4/10 during daily activities.    Time 6   Period Weeks   OT SHORT TERM GOAL #3   Title Pt will decrease fascial restrictions from mod to min amounts.    Time 6   Period Weeks   OT SHORT TERM GOAL #4   Title Pt will increase PROM to [facility] to increase ability to participate in donning shirts.    Time 6   Period Weeks   OT SHORT TERM GOAL #5   Title Pt will increase strength to 3-/5 to increase ability to increase ability to assist in performing daily tasks.    Time 6   Period Weeks   Status On-going           OT Long Term Goals - 04/11/15 1038    OT LONG TERM GOAL #1   Title Pt will return to highest level of  functioning and independence in all daily and leisure tasks.    Time 12   Period Weeks   Status On-going   OT LONG TERM GOAL #2   Title Pt will decrease pain to 1/10 or less during daily tasks.    Time 12   Period Weeks   Status On-going   OT LONG TERM GOAL #3   Title Pt will decrease fascial restrictions from min to trace amounts or less.    Time 12   Period Weeks   Status On-going   OT LONG TERM GOAL #4   Title Pt will increase AROM to WNL to increase ability to reach into overhead cabinets.    Time 12   Period Weeks   Status On-going   OT LONG TERM GOAL #5   Title Pt will increase strength to 4+/5 to improve ability to care for mother.    Time 12   Period Weeks   Status On-going               Plan - 04/17/15 1200    Clinical Impression Statement A: Added AAROM standing, proximal shoulder strengthening, and wall wash. Pt tolerated well. Added AAROM to HEP. Answered all questions.   Plan P: Increase reps supine if able during AAROM.        Problem List Patient Active Problem List   Diagnosis Date Noted  . HTN (hypertension) 07/30/2013  . Dyslipidemia 07/30/2013  . Chest pain- low risk Myoview 8/13 07/30/2013    Ailene Ravel, OTR/L,CBIS  [phone]   04/17/2015, 12:02 PM  [place] [address] [place], [place], [address] Phone: [phone]   Fax:  [phone]

## 2015-04-18 ENCOUNTER — Encounter (HOSPITAL_COMMUNITY): Payer: Self-pay

## 2015-04-18 ENCOUNTER — Ambulatory Visit (HOSPITAL_COMMUNITY): Payer: Medicare Other

## 2015-04-18 DIAGNOSIS — M75102 Unspecified rotator cuff tear or rupture of left shoulder, not specified as traumatic: Secondary | ICD-10-CM | POA: Diagnosis not present

## 2015-04-18 DIAGNOSIS — R531 Weakness: Secondary | ICD-10-CM

## 2015-04-18 DIAGNOSIS — M25512 Pain in left shoulder: Secondary | ICD-10-CM | POA: Diagnosis not present

## 2015-04-18 DIAGNOSIS — M6289 Other specified disorders of muscle: Secondary | ICD-10-CM

## 2015-04-18 DIAGNOSIS — M25811 Other specified joint disorders, right shoulder: Secondary | ICD-10-CM | POA: Diagnosis not present

## 2015-04-18 DIAGNOSIS — Z9889 Other specified postprocedural states: Secondary | ICD-10-CM | POA: Diagnosis not present

## 2015-04-18 DIAGNOSIS — M7582 Other shoulder lesions, left shoulder: Secondary | ICD-10-CM | POA: Diagnosis not present

## 2015-04-18 DIAGNOSIS — M6281 Muscle weakness (generalized): Secondary | ICD-10-CM | POA: Diagnosis not present

## 2015-04-18 DIAGNOSIS — M25612 Stiffness of left shoulder, not elsewhere classified: Secondary | ICD-10-CM

## 2015-04-18 DIAGNOSIS — M629 Disorder of muscle, unspecified: Secondary | ICD-10-CM

## 2015-04-18 NOTE — Therapy (Signed)
[place], [place], [address] Phone: [phone]   Fax:  [phone]  Occupational Therapy Treatment  [name] MRN: [id number] Date of Birth: [birth year] Referring Provider:  Elsie Saas, MD  Encounter Date: 04/18/2015      OT End of Session - 04/18/15 1023    Visit Number 15   Number of Visits 36   Date for OT Re-Evaluation 05/13/15   Authorization Type Medicare A & B   Authorization Time Period Before 29th visit   Authorization - Visit Number 15   Authorization - Number of Visits 29   OT Start Time [phone]   OT Stop Time 1025   OT Time Calculation (min) 44 min   Activity Tolerance Patient tolerated treatment well   Behavior During Therapy [facility] for tasks assessed/performed      Past Medical History  Diagnosis Date  . HTN (hypertension)   . Dyslipidemia   . Rheumatic fever     as a child  . Hypothyroidism     Past Surgical History  Procedure Laterality Date  . Rotator cuff repair  1991  . Tubal ligation  1973  . Colonoscopy N/A 02/02/2015    Procedure: COLONOSCOPY;  Surgeon: Rogene Houston, MD;  [facility];  Service: Endoscopy;  Laterality: N/A;  930    There were no vitals filed for this visit.  Visit Diagnosis:  Tight fascia  Stiffness of shoulder joint, left  Decreased strength      Subjective Assessment - 04/18/15 1007    Subjective  S: My arm feels really good today.   Currently in Pain? No/denies            [facility] OT Assessment - 04/18/15 1007    Assessment   Diagnosis s/p left RCR   Precautions   Precautions Shoulder   Type of Shoulder Precautions AAROM and progress as tolerated                   OT Treatments/Exercises (OP) - 04/18/15 1008    Exercises   Exercises Shoulder   Shoulder Exercises: Supine   Protraction PROM;10 reps;AAROM;12 reps   Horizontal ABduction PROM;10 reps;AAROM;12 reps   External Rotation  PROM;10 reps;AAROM;12 reps   Internal Rotation PROM;10 reps;AAROM;12 reps   Flexion PROM;10 reps;AAROM;12 reps   ABduction PROM;10 reps;AAROM;12 reps   Shoulder Exercises: Standing   Protraction AAROM;12 reps   Horizontal ABduction AAROM;10 reps   External Rotation AAROM;12 reps   Internal Rotation AAROM;12 reps   Flexion AAROM;12 reps   ABduction AAROM;12 reps   Shoulder Exercises: Pulleys   Flexion 1 minute   ABduction 1 minute   Shoulder Exercises: Therapy Ball   Right/Left 5 reps   Shoulder Exercises: ROM/Strengthening   Wall Wash 1'   Thumb Tacks 1'    Proximal Shoulder Strengthening, Supine 12X no rest breaks   Proximal Shoulder Strengthening, Seated 12X with rest breaks   Prot/Ret//Elev/Dep 1'   Manual Therapy   Manual Therapy Myofascial release   Myofascial Release Myofascial release to left upper arm, scapular, trapezius, sternocleidomastoid, and shoulder region to decrease pain and restrictions and improve pain free mobility in her left shoulder                OT Education - 04/17/15 1133    Education provided Yes   Education Details AAROM exercises   Person(s) Educated Patient   Methods Explanation;Demonstration;Handout   Comprehension Verbalized  understanding;Returned demonstration          OT Short Term Goals - 04/17/15 1201    OT SHORT TERM GOAL #1   Title Pt will be educated on HEP.    Time 6   Period Weeks   OT SHORT TERM GOAL #2   Title Pt will decrease pain to 4/10 during daily activities.    Time 6   Period Weeks   OT SHORT TERM GOAL #3   Title Pt will decrease fascial restrictions from mod to min amounts.    Time 6   Period Weeks   OT SHORT TERM GOAL #4   Title Pt will increase PROM to [facility] to increase ability to participate in donning shirts.    Time 6   Period Weeks   OT SHORT TERM GOAL #5   Title Pt will increase strength to 3-/5 to increase ability to increase ability to assist in performing daily tasks.    Time 6   Period  Weeks   Status On-going           OT Long Term Goals - 04/11/15 1038    OT LONG TERM GOAL #1   Title Pt will return to highest level of functioning and independence in all daily and leisure tasks.    Time 12   Period Weeks   Status On-going   OT LONG TERM GOAL #2   Title Pt will decrease pain to 1/10 or less during daily tasks.    Time 12   Period Weeks   Status On-going   OT LONG TERM GOAL #3   Title Pt will decrease fascial restrictions from min to trace amounts or less.    Time 12   Period Weeks   Status On-going   OT LONG TERM GOAL #4   Title Pt will increase AROM to WNL to increase ability to reach into overhead cabinets.    Time 12   Period Weeks   Status On-going   OT LONG TERM GOAL #5   Title Pt will increase strength to 4+/5 to improve ability to care for mother.    Time 12   Period Weeks   Status On-going               Plan - 04/18/15 1023    Clinical Impression Statement A: Increased reps supine and standing. Added right/left with therapy ball. patient tolerated well. Range of motion during passive stretching has noticably increased this date.   Plan P: Cont to increase PROM to increase functional performance during daily tasks.        Problem List Patient Active Problem List   Diagnosis Date Noted  . HTN (hypertension) 07/30/2013  . Dyslipidemia 07/30/2013  . Chest pain- low risk Myoview 8/13 07/30/2013    Ailene Ravel, OTR/L,CBIS  [phone]  04/18/2015, 10:29 AM  [place], [place], [address] Phone: [phone]   Fax:  [phone]

## 2015-04-20 ENCOUNTER — Encounter (HOSPITAL_COMMUNITY): Payer: Self-pay | Admitting: Occupational Therapy

## 2015-04-20 ENCOUNTER — Ambulatory Visit (HOSPITAL_COMMUNITY): Payer: Medicare Other | Attending: Orthopedic Surgery | Admitting: Occupational Therapy

## 2015-04-20 DIAGNOSIS — M629 Disorder of muscle, unspecified: Secondary | ICD-10-CM

## 2015-04-20 DIAGNOSIS — M25512 Pain in left shoulder: Secondary | ICD-10-CM

## 2015-04-20 DIAGNOSIS — M6289 Other specified disorders of muscle: Secondary | ICD-10-CM

## 2015-04-20 DIAGNOSIS — M25811 Other specified joint disorders, right shoulder: Secondary | ICD-10-CM | POA: Insufficient documentation

## 2015-04-20 DIAGNOSIS — M7582 Other shoulder lesions, left shoulder: Secondary | ICD-10-CM | POA: Diagnosis not present

## 2015-04-20 DIAGNOSIS — M6281 Muscle weakness (generalized): Secondary | ICD-10-CM | POA: Insufficient documentation

## 2015-04-20 DIAGNOSIS — M75102 Unspecified rotator cuff tear or rupture of left shoulder, not specified as traumatic: Secondary | ICD-10-CM | POA: Diagnosis not present

## 2015-04-20 DIAGNOSIS — Z9889 Other specified postprocedural states: Secondary | ICD-10-CM | POA: Insufficient documentation

## 2015-04-20 DIAGNOSIS — M25612 Stiffness of left shoulder, not elsewhere classified: Secondary | ICD-10-CM

## 2015-04-20 DIAGNOSIS — R531 Weakness: Secondary | ICD-10-CM

## 2015-04-20 NOTE — Therapy (Signed)
[place], [place], [address] Phone: [phone]   Fax:  [phone]  Occupational Therapy Treatment  [name] MRN: [id number] Date of Birth: 14-Jan-1941 Referring Provider:  Redmond School, MD  Encounter Date: 04/20/2015      OT End of Session - 04/20/15 1252    Visit Number 16   Number of Visits 36   Date for OT Re-Evaluation 05/13/15   Authorization Type Medicare A & B   Authorization Time Period Before 29th visit   Authorization - Visit Number 70   Authorization - Number of Visits 29   OT Start Time 1100   OT Stop Time 1146   OT Time Calculation (min) 46 min   Activity Tolerance Patient tolerated treatment well   Behavior During Therapy [facility] for tasks assessed/performed      Past Medical History  Diagnosis Date  . HTN (hypertension)   . Dyslipidemia   . Rheumatic fever     as a child  . Hypothyroidism     Past Surgical History  Procedure Laterality Date  . Rotator cuff repair  1991  . Tubal ligation  1973  . Colonoscopy N/A 02/02/2015    Procedure: COLONOSCOPY;  Surgeon: Rogene Houston, MD;  [facility];  Service: Endoscopy;  Laterality: N/A;  930    There were no vitals filed for this visit.  Visit Diagnosis:  Tight fascia  Stiffness of shoulder joint, left  Decreased strength  Pain in left shoulder  Decreased range of motion of shoulder, left      Subjective Assessment - 04/20/15 1251    Subjective  S: I bought a pulley system because I feel like that really stretches out my arm.    Currently in Pain? No/denies            [facility] OT Assessment - 04/20/15 1251    Assessment   Diagnosis s/p left RCR   Precautions   Precautions Shoulder   Type of Shoulder Precautions AAROM and progress as tolerated                   OT Treatments/Exercises (OP) - 04/20/15 1251    Exercises   Exercises Shoulder   Shoulder Exercises:  Supine   Protraction PROM;10 reps;AAROM;12 reps   Horizontal ABduction PROM;10 reps;AAROM;12 reps   External Rotation PROM;10 reps;AAROM;12 reps   Internal Rotation PROM;10 reps;AAROM;12 reps   Flexion PROM;10 reps;AAROM;12 reps   ABduction PROM;10 reps;AAROM;12 reps   Shoulder Exercises: Standing   Protraction AAROM;12 reps   Horizontal ABduction AAROM;10 reps   External Rotation AAROM;12 reps   Internal Rotation AAROM;12 reps   Flexion AAROM;12 reps   ABduction AAROM;12 reps   Shoulder Exercises: Pulleys   Flexion --  1'30"   ABduction --  1'30"   Shoulder Exercises: ROM/Strengthening   Wall Wash 1'   Thumb Tacks 1'    Proximal Shoulder Strengthening, Supine 12X no rest breaks   Proximal Shoulder Strengthening, Seated 12X with rest breaks   Prot/Ret//Elev/Dep 1'   Manual Therapy   Manual Therapy Myofascial release   Myofascial Release Myofascial release to left upper arm, scapular, trapezius, sternocleidomastoid, and shoulder region to decrease pain and restrictions and improve pain free mobility in her left shoulder                  OT Short Term Goals - 04/17/15 1201    OT SHORT TERM GOAL #  1   Title Pt will be educated on HEP.    Time 6   Period Weeks   OT SHORT TERM GOAL #2   Title Pt will decrease pain to 4/10 during daily activities.    Time 6   Period Weeks   OT SHORT TERM GOAL #3   Title Pt will decrease fascial restrictions from mod to min amounts.    Time 6   Period Weeks   OT SHORT TERM GOAL #4   Title Pt will increase PROM to [facility] to increase ability to participate in donning shirts.    Time 6   Period Weeks   OT SHORT TERM GOAL #5   Title Pt will increase strength to 3-/5 to increase ability to increase ability to assist in performing daily tasks.    Time 6   Period Weeks   Status On-going           OT Long Term Goals - 04/11/15 1038    OT LONG TERM GOAL #1   Title Pt will return to highest level of functioning and independence in  all daily and leisure tasks.    Time 12   Period Weeks   Status On-going   OT LONG TERM GOAL #2   Title Pt will decrease pain to 1/10 or less during daily tasks.    Time 12   Period Weeks   Status On-going   OT LONG TERM GOAL #3   Title Pt will decrease fascial restrictions from min to trace amounts or less.    Time 12   Period Weeks   Status On-going   OT LONG TERM GOAL #4   Title Pt will increase AROM to WNL to increase ability to reach into overhead cabinets.    Time 12   Period Weeks   Status On-going   OT LONG TERM GOAL #5   Title Pt will increase strength to 4+/5 to improve ability to care for mother.    Time 12   Period Weeks   Status On-going               Plan - 04/20/15 1253    Clinical Impression Statement A: Continued range of motion exercises this session. Pt continues to be pain limited with flexion & abduction, flexion approximately 75% and abduction approximately 60% during PROM. Pt tolerated treatment well.    Plan P: Continue to increase PROM to [facility] to increase function. Increase wall wash to 2'        Problem List Patient Active Problem List   Diagnosis Date Noted  . HTN (hypertension) 07/30/2013  . Dyslipidemia 07/30/2013  . Chest pain- low risk Myoview 8/13 07/30/2013    Guadelupe Sabin, OTR/L  [phone]  04/20/2015, 12:56 PM  [place] [address] [place], [place], [address] Phone: [phone]   Fax:  [phone]

## 2015-04-25 ENCOUNTER — Ambulatory Visit (HOSPITAL_COMMUNITY): Payer: Medicare Other | Attending: Orthopedic Surgery | Admitting: Occupational Therapy

## 2015-04-25 ENCOUNTER — Encounter (HOSPITAL_COMMUNITY): Payer: Self-pay | Admitting: Occupational Therapy

## 2015-04-25 DIAGNOSIS — R531 Weakness: Secondary | ICD-10-CM

## 2015-04-25 DIAGNOSIS — M25512 Pain in left shoulder: Secondary | ICD-10-CM | POA: Diagnosis not present

## 2015-04-25 DIAGNOSIS — M25612 Stiffness of left shoulder, not elsewhere classified: Secondary | ICD-10-CM | POA: Diagnosis not present

## 2015-04-25 DIAGNOSIS — M629 Disorder of muscle, unspecified: Secondary | ICD-10-CM | POA: Diagnosis not present

## 2015-04-25 DIAGNOSIS — M7582 Other shoulder lesions, left shoulder: Secondary | ICD-10-CM | POA: Diagnosis not present

## 2015-04-25 DIAGNOSIS — M6281 Muscle weakness (generalized): Secondary | ICD-10-CM | POA: Diagnosis not present

## 2015-04-25 DIAGNOSIS — M6289 Other specified disorders of muscle: Secondary | ICD-10-CM

## 2015-04-25 NOTE — Therapy (Signed)
[place], [place], [address] Phone: [phone]   Fax:  [phone]  Occupational Therapy Treatment  Patient Details  Name: LOLETHA BERTINI MRN: [id number] Date of Birth: [birth year] Referring Provider:  Elsie Saas, MD  Encounter Date: 04/25/2015      OT End of Session - 04/25/15 1142    Visit Number 17   Number of Visits 36   Date for OT Re-Evaluation 05/13/15   Authorization Type Medicare A & B   Authorization Time Period Before 29th visit   Authorization - Visit Number 17   Authorization - Number of Visits 29   OT Start Time 1017   OT Stop Time 1101   OT Time Calculation (min) 44 min   Activity Tolerance Patient tolerated treatment well   Behavior During Therapy [facility] for tasks assessed/performed      Past Medical History  Diagnosis Date  . HTN (hypertension)   . Dyslipidemia   . Rheumatic fever     as a child  . Hypothyroidism     Past Surgical History  Procedure Laterality Date  . Rotator cuff repair  1991  . Tubal ligation  1973  . Colonoscopy N/A 02/02/2015    Procedure: COLONOSCOPY;  Surgeon: Rogene Houston, MD;  [facility];  Service: Endoscopy;  Laterality: N/A;  930    There were no vitals filed for this visit.  Visit Diagnosis:  Tight fascia  Stiffness of shoulder joint, left  Decreased strength  Pain in left shoulder  Decreased range of motion of shoulder, left      Subjective Assessment - 04/25/15 1019    Subjective  S: My pulley works ok, not as well as the one here.    Currently in Pain? No/denies            [facility] OT Assessment - 04/25/15 1018    Assessment   Diagnosis s/p left RCR   Precautions   Precautions Shoulder   Type of Shoulder Precautions AAROM and progress as tolerated                   OT Treatments/Exercises (OP) - 04/25/15 1020    Exercises   Exercises Shoulder   Shoulder Exercises: Supine   Protraction PROM;10  reps;AAROM;12 reps   Horizontal ABduction PROM;10 reps;AAROM;12 reps   External Rotation PROM;10 reps;AAROM;12 reps   Internal Rotation PROM;10 reps;AAROM;12 reps   Flexion PROM;10 reps;AAROM;12 reps   ABduction PROM;10 reps;AAROM;12 reps   Shoulder Exercises: Standing   Protraction AAROM;12 reps   Horizontal ABduction AAROM;10 reps   External Rotation AAROM;12 reps   Internal Rotation AAROM;12 reps   Flexion AAROM;12 reps   ABduction AAROM;12 reps   Shoulder Exercises: ROM/Strengthening   Wall Wash 2'   Thumb Tacks 1'30"   Proximal Shoulder Strengthening, Supine 12X no rest breaks   Proximal Shoulder Strengthening, Seated 12X with no rest breaks   Manual Therapy   Manual Therapy Myofascial release   Myofascial Release Myofascial release to left upper arm, scapular, trapezius, sternocleidomastoid, and shoulder region to decrease pain and restrictions and improve pain free mobility in her left shoulder                  OT Short Term Goals - 04/17/15 1201    OT SHORT TERM GOAL #1   Title Pt will be educated on HEP.    Time 6   Period Weeks   OT SHORT TERM GOAL #  2   Title Pt will decrease pain to 4/10 during daily activities.    Time 6   Period Weeks   OT SHORT TERM GOAL #3   Title Pt will decrease fascial restrictions from mod to min amounts.    Time 6   Period Weeks   OT SHORT TERM GOAL #4   Title Pt will increase PROM to [facility] to increase ability to participate in donning shirts.    Time 6   Period Weeks   OT SHORT TERM GOAL #5   Title Pt will increase strength to 3-/5 to increase ability to increase ability to assist in performing daily tasks.    Time 6   Period Weeks   Status On-going           OT Long Term Goals - 04/11/15 1038    OT LONG TERM GOAL #1   Title Pt will return to highest level of functioning and independence in all daily and leisure tasks.    Time 12   Period Weeks   Status On-going   OT LONG TERM GOAL #2   Title Pt will decrease  pain to 1/10 or less during daily tasks.    Time 12   Period Weeks   Status On-going   OT LONG TERM GOAL #3   Title Pt will decrease fascial restrictions from min to trace amounts or less.    Time 12   Period Weeks   Status On-going   OT LONG TERM GOAL #4   Title Pt will increase AROM to WNL to increase ability to reach into overhead cabinets.    Time 12   Period Weeks   Status On-going   OT LONG TERM GOAL #5   Title Pt will increase strength to 4+/5 to improve ability to care for mother.    Time 12   Period Weeks   Status On-going               Plan - 04/25/15 1142    Clinical Impression Statement A: Increased wall wash to 2', thumb tacks to 1'30". Pt had slight increase during PROM flexion, continues to be pain limited. Pt reports increased soreness this session from difficulty sleeping last night. Pt tolerated treatment well.    Plan P: Continue working to increase PROM-add muscle energy technique. Increase pulleys to 2'. Add wall stretch flexion if able to tolerate.         Problem List Patient Active Problem List   Diagnosis Date Noted  . HTN (hypertension) 07/30/2013  . Dyslipidemia 07/30/2013  . Chest pain- low risk Myoview 8/13 07/30/2013    Guadelupe Sabin, OTR/L  [phone]  04/25/2015, 11:45 AM  [place], [place], [address] Phone: [phone]   Fax:  [phone]

## 2015-04-26 ENCOUNTER — Encounter (HOSPITAL_COMMUNITY): Payer: Self-pay

## 2015-04-26 ENCOUNTER — Encounter (HOSPITAL_COMMUNITY): Payer: Medicare Other

## 2015-04-26 ENCOUNTER — Ambulatory Visit (HOSPITAL_COMMUNITY): Payer: Medicare Other

## 2015-04-26 DIAGNOSIS — M6289 Other specified disorders of muscle: Secondary | ICD-10-CM

## 2015-04-26 DIAGNOSIS — M629 Disorder of muscle, unspecified: Secondary | ICD-10-CM | POA: Diagnosis not present

## 2015-04-26 DIAGNOSIS — M25612 Stiffness of left shoulder, not elsewhere classified: Secondary | ICD-10-CM

## 2015-04-26 DIAGNOSIS — R531 Weakness: Secondary | ICD-10-CM

## 2015-04-26 DIAGNOSIS — M7582 Other shoulder lesions, left shoulder: Secondary | ICD-10-CM | POA: Diagnosis not present

## 2015-04-26 DIAGNOSIS — M6281 Muscle weakness (generalized): Secondary | ICD-10-CM | POA: Diagnosis not present

## 2015-04-26 DIAGNOSIS — M25512 Pain in left shoulder: Secondary | ICD-10-CM | POA: Diagnosis not present

## 2015-04-26 NOTE — Therapy (Signed)
[place], [place], [address] Phone: [phone]   Fax:  [phone]  Occupational Therapy Treatment  [name] MRN: [id number] Date of Birth: 1941/01/09 Referring Provider:  Elsie Saas, MD  Encounter Date: 04/26/2015      OT End of Session - 04/26/15 1022    Visit Number 18   Number of Visits 36   Date for OT Re-Evaluation 05/13/15   Authorization Type Medicare A & B   Authorization Time Period Before 29th visit   Authorization - Visit Number 18   Authorization - Number of Visits 84   OT Start Time 0845   OT Stop Time 0930   OT Time Calculation (min) 45 min   Activity Tolerance Patient tolerated treatment well   Behavior During Therapy [facility] for tasks assessed/performed      Past Medical History  Diagnosis Date  . HTN (hypertension)   . Dyslipidemia   . Rheumatic fever     as a child  . Hypothyroidism     Past Surgical History  Procedure Laterality Date  . Rotator cuff repair  1991  . Tubal ligation  1973  . Colonoscopy N/A 02/02/2015    Procedure: COLONOSCOPY;  Surgeon: Rogene Houston, MD;  [facility];  Service: Endoscopy;  Laterality: N/A;  930    There were no vitals filed for this visit.  Visit Diagnosis:  Tight fascia  Stiffness of shoulder joint, left  Decreased strength      Subjective Assessment - 04/26/15 0903    Subjective  S: The stick is still challenging when I'm going back behind my head and the snow angel.    Currently in Pain? No/denies            [facility] OT Assessment - 04/26/15 0904    Assessment   Diagnosis s/p left RCR   Precautions   Precautions Shoulder   Type of Shoulder Precautions AAROM and progress as tolerated                   OT Treatments/Exercises (OP) - 04/26/15 0904    Exercises   Exercises Shoulder   Shoulder Exercises: Supine   Protraction PROM;5 reps;AROM;10 reps   Horizontal  ABduction PROM;5 reps;AROM;10 reps   External Rotation PROM;5 reps;AROM;10 reps   Internal Rotation PROM;5 reps;AROM;10 reps   Flexion PROM;5 reps;AROM;10 reps   ABduction PROM;5 reps;AROM;10 reps   Shoulder Exercises: Standing   Protraction AAROM;15 reps   Horizontal ABduction AAROM;15 reps   External Rotation AAROM;15 reps   Internal Rotation AAROM;15 reps   Flexion AAROM;15 reps   ABduction AAROM;15 reps   Extension Theraband;10 reps   Theraband Level (Shoulder Extension) Level 3 (Green)   Row [employer] reps   Theraband Level (Shoulder Row) Level 3 (Green)   Retraction Theraband;10 reps   Theraband Level (Shoulder Retraction) Level 3 (Green)   Shoulder Exercises: Pulleys   Flexion 2 minutes   ABduction 2 minutes   Shoulder Exercises: ROM/Strengthening   Proximal Shoulder Strengthening, Supine 15X no rest breaks   Proximal Shoulder Strengthening, Seated 15X no rest breaks   Manual Therapy   Manual Therapy Myofascial release;Muscle Energy Technique   Myofascial Release Myofascial release to left upper arm, scapular, trapezius, sternocleidomastoid, and shoulder region to decrease pain and restrictions and improve pain free mobility in her left shoulder   Muscle Energy Technique Muscle energy techique to left anterior and medial deltoid to relax  tone and muscle spasm and improve range of motion.                 OT Education - 04/26/15 1022    Education provided Yes   Education Details Shoulder flexion and abduction stretches   Person(s) Educated Patient   Methods Explanation;Demonstration;Handout   Comprehension Returned demonstration;Verbalized understanding          OT Short Term Goals - 04/17/15 1201    OT SHORT TERM GOAL #1   Title Pt will be educated on HEP.    Time 6   Period Weeks   OT SHORT TERM GOAL #2   Title Pt will decrease pain to 4/10 during daily activities.    Time 6   Period Weeks   OT SHORT TERM GOAL #3   Title Pt will decrease fascial  restrictions from mod to min amounts.    Time 6   Period Weeks   OT SHORT TERM GOAL #4   Title Pt will increase PROM to [facility] to increase ability to participate in donning shirts.    Time 6   Period Weeks   OT SHORT TERM GOAL #5   Title Pt will increase strength to 3-/5 to increase ability to increase ability to assist in performing daily tasks.    Time 6   Period Weeks   Status On-going           OT Long Term Goals - 04/11/15 1038    OT LONG TERM GOAL #1   Title Pt will return to highest level of functioning and independence in all daily and leisure tasks.    Time 12   Period Weeks   Status On-going   OT LONG TERM GOAL #2   Title Pt will decrease pain to 1/10 or less during daily tasks.    Time 12   Period Weeks   Status On-going   OT LONG TERM GOAL #3   Title Pt will decrease fascial restrictions from min to trace amounts or less.    Time 12   Period Weeks   Status On-going   OT LONG TERM GOAL #4   Title Pt will increase AROM to WNL to increase ability to reach into overhead cabinets.    Time 12   Period Weeks   Status On-going   OT LONG TERM GOAL #5   Title Pt will increase strength to 4+/5 to improve ability to care for mother.    Time 12   Period Weeks   Status On-going               Plan - 04/26/15 1022    Clinical Impression Statement A: Added muscle energy technique, AROM supine, and scapular theraband exercises. Patient tolerated well. Patient continues to have slight pain at end stretch during passive stretching although was able to achieve more of a stretch using muscle energy technique.    Plan P: Add scapular theraband exercises to HEP. Follow up on new shoulder stretches. Progress to AROM standing when able.        Problem List Patient Active Problem List   Diagnosis Date Noted  . HTN (hypertension) 07/30/2013  . Dyslipidemia 07/30/2013  . Chest pain- low risk Myoview 8/13 07/30/2013    Ailene Ravel, OTR/L,CBIS  [phone]   04/26/2015, 10:25 AM  [place], [place], [address] Phone: [phone]   Fax:  [phone]

## 2015-04-26 NOTE — Patient Instructions (Signed)
ROM: Flexion (Alternate)   Slide right arm up wall, with palm out, by leaning toward wall. Hold __10__ seconds. Repeat _3___ times per set. Do __1__ sets per session. Do __2-3__ sessions per day.  http://orth.exer.us/759   Copyright  VHI. All rights reserved.   Closed Chain: Shoulder Abduction / Adduction - on Wall   One hand on wall, step to side and return. Stepping causes shoulder to come closer to your ear. Keep elbow straight. Hold 10 seconds. Repeat 3 times.   http://ss.exer.us/267   Copyright  VHI. All rights reserved.

## 2015-04-28 ENCOUNTER — Ambulatory Visit (HOSPITAL_COMMUNITY): Payer: Medicare Other | Admitting: Occupational Therapy

## 2015-04-28 ENCOUNTER — Encounter (HOSPITAL_COMMUNITY): Payer: Self-pay | Admitting: Occupational Therapy

## 2015-04-28 DIAGNOSIS — M25512 Pain in left shoulder: Secondary | ICD-10-CM | POA: Diagnosis not present

## 2015-04-28 DIAGNOSIS — M6281 Muscle weakness (generalized): Secondary | ICD-10-CM | POA: Diagnosis not present

## 2015-04-28 DIAGNOSIS — M25612 Stiffness of left shoulder, not elsewhere classified: Secondary | ICD-10-CM | POA: Diagnosis not present

## 2015-04-28 DIAGNOSIS — M6289 Other specified disorders of muscle: Secondary | ICD-10-CM

## 2015-04-28 DIAGNOSIS — R531 Weakness: Secondary | ICD-10-CM

## 2015-04-28 DIAGNOSIS — M629 Disorder of muscle, unspecified: Secondary | ICD-10-CM | POA: Diagnosis not present

## 2015-04-28 DIAGNOSIS — M7582 Other shoulder lesions, left shoulder: Secondary | ICD-10-CM | POA: Diagnosis not present

## 2015-04-28 NOTE — Therapy (Addendum)
[place], [place], [address] Phone: [phone]   Fax:  [phone]  Occupational Therapy Treatment  [name] MRN: [id number] Date of Birth: 25-Jul-1941 Referring Provider:  Elsie Saas, MD  Encounter Date: 04/28/2015      OT End of Session - 04/28/15 1347    Visit Number 19   Number of Visits 37   Date for OT Re-Evaluation 05/13/15   Authorization Type Medicare A & B   Authorization Time Period Before 29th visit   Authorization - Visit Number 19   Authorization - Number of Visits 29   OT Start Time 1302   OT Stop Time 1344   OT Time Calculation (min) 42 min   Activity Tolerance Patient tolerated treatment well   Behavior During Therapy [facility] for tasks assessed/performed      Past Medical History  Diagnosis Date  . HTN (hypertension)   . Dyslipidemia   . Rheumatic fever     as a child  . Hypothyroidism     Past Surgical History  Procedure Laterality Date  . Rotator cuff repair  1991  . Tubal ligation  1973  . Colonoscopy N/A 02/02/2015    Procedure: COLONOSCOPY;  Surgeon: Rogene Houston, MD;  [facility];  Service: Endoscopy;  Laterality: N/A;  930    There were no vitals filed for this visit.  Visit Diagnosis:  Tight fascia  Stiffness of shoulder joint, left  Decreased strength  Pain in left shoulder  Decreased range of motion of shoulder, left      Subjective Assessment - 04/28/15 1304    Subjective  S: The exercises at home are going ok, I'm trying those new stretches.    Currently in Pain? No/denies            [facility] OT Assessment - 04/28/15 1302    Assessment   Diagnosis s/p left RCR   Precautions   Precautions Shoulder   Type of Shoulder Precautions AAROM and progress as tolerated                   OT Treatments/Exercises (OP) - 04/28/15 1305    Exercises   Exercises Shoulder   Shoulder Exercises: Supine   Protraction PROM;5 reps;AROM;10 reps   Horizontal ABduction PROM;5 reps;AROM;10 reps   External Rotation PROM;5 reps;AROM;10 reps   Internal Rotation PROM;5 reps;AROM;10 reps   Flexion PROM;5 reps;AROM;10 reps   ABduction PROM;5 reps;AROM;10 reps   Shoulder Exercises: Standing   Protraction AROM;10 reps   Horizontal ABduction AROM;10 reps   External Rotation AROM;10 reps   Internal Rotation AROM;10 reps   Flexion AROM;10 reps   ABduction AROM;10 reps   Extension Theraband;10 reps   Theraband Level (Shoulder Extension) Level 3 (Green)   Row [employer] reps   Theraband Level (Shoulder Row) Level 3 (Green)   Retraction Theraband;10 reps   Theraband Level (Shoulder Retraction) Level 3 (Green)   Shoulder Exercises: Pulleys   Flexion 2 minutes   ABduction 2 minutes   Shoulder Exercises: ROM/Strengthening   Proximal Shoulder Strengthening, Supine 15X no rest breaks   Proximal Shoulder Strengthening, Seated 15X no rest breaks   Manual Therapy   Manual Therapy Myofascial release;Muscle Energy Technique   Myofascial Release Myofascial release to left upper arm, scapular, trapezius, sternocleidomastoid, and shoulder region to decrease pain and restrictions and improve pain free mobility in her left shoulder   Muscle Energy Technique Muscle energy techique  to left anterior and medial deltoid to relax tone and muscle spasm and improve range of motion.                   OT Short Term Goals - 04/17/15 1201    OT SHORT TERM GOAL #1   Title Pt will be educated on HEP.    Time 6   Period Weeks   OT SHORT TERM GOAL #2   Title Pt will decrease pain to 4/10 during daily activities.    Time 6   Period Weeks   OT SHORT TERM GOAL #3   Title Pt will decrease fascial restrictions from mod to min amounts.    Time 6   Period Weeks   OT SHORT TERM GOAL #4   Title Pt will increase PROM to [facility] to increase ability to participate in donning shirts.    Time 6   Period Weeks   OT SHORT TERM  GOAL #5   Title Pt will increase strength to 3-/5 to increase ability to increase ability to assist in performing daily tasks.    Time 6   Period Weeks   Status On-going           OT Long Term Goals - 04/11/15 1038    OT LONG TERM GOAL #1   Title Pt will return to highest level of functioning and independence in all daily and leisure tasks.    Time 12   Period Weeks   Status On-going   OT LONG TERM GOAL #2   Title Pt will decrease pain to 1/10 or less during daily tasks.    Time 12   Period Weeks   Status On-going   OT LONG TERM GOAL #3   Title Pt will decrease fascial restrictions from min to trace amounts or less.    Time 12   Period Weeks   Status On-going   OT LONG TERM GOAL #4   Title Pt will increase AROM to WNL to increase ability to reach into overhead cabinets.    Time 12   Period Weeks   Status On-going   OT LONG TERM GOAL #5   Title Pt will increase strength to 4+/5 to improve ability to care for mother.    Time 12   Period Weeks   Status On-going               Plan - 04/28/15 1347    Clinical Impression Statement A: Added AROM in standing. Added scapular theraband exercises to HEP. Pt reports she is completing her shoulder stretches at home, abduction was difficulty due to increased discomfort.               Plan:                 P: Add UBE level 1, follow up on scapular theraband HEP.    Problem List Patient Active Problem List   Diagnosis Date Noted  . HTN (hypertension) 07/30/2013  . Dyslipidemia 07/30/2013  . Chest pain- low risk Myoview 8/13 07/30/2013    Guadelupe Sabin, OTR/L  [phone]  04/28/2015, 1:49 PM  [place] [address] [place], [place], [address] Phone: [phone]   Fax:  [phone]

## 2015-04-28 NOTE — Patient Instructions (Signed)

## 2015-05-01 ENCOUNTER — Ambulatory Visit (HOSPITAL_COMMUNITY): Payer: Medicare Other | Admitting: Specialist

## 2015-05-01 DIAGNOSIS — M6289 Other specified disorders of muscle: Secondary | ICD-10-CM

## 2015-05-01 DIAGNOSIS — M25512 Pain in left shoulder: Secondary | ICD-10-CM | POA: Diagnosis not present

## 2015-05-01 DIAGNOSIS — M7582 Other shoulder lesions, left shoulder: Secondary | ICD-10-CM | POA: Diagnosis not present

## 2015-05-01 DIAGNOSIS — M6281 Muscle weakness (generalized): Secondary | ICD-10-CM | POA: Diagnosis not present

## 2015-05-01 DIAGNOSIS — M25612 Stiffness of left shoulder, not elsewhere classified: Secondary | ICD-10-CM | POA: Diagnosis not present

## 2015-05-01 DIAGNOSIS — M629 Disorder of muscle, unspecified: Secondary | ICD-10-CM

## 2015-05-01 DIAGNOSIS — R531 Weakness: Secondary | ICD-10-CM

## 2015-05-01 NOTE — Therapy (Signed)
[place], [place], [address] Phone: [phone]   Fax:  [phone]  Occupational Therapy Treatment  [name] MRN: [id number] Date of Birth: 05-Jan-1941 Referring Provider:  Elsie Saas, MD  Encounter Date: 05/01/2015      OT End of Session - 05/01/15 1137    Visit Number 20   Number of Visits 36   Date for OT Re-Evaluation 06/30/15  mini reassess on 05/30/15   Authorization Type Medicare A & B   Authorization Time Period before 30th visit   Authorization - Visit Number 20   Authorization - Number of Visits 30   OT Start Time 1105   OT Stop Time 1150   OT Time Calculation (min) 45 min   Activity Tolerance Patient tolerated treatment well   Behavior During Therapy [facility] for tasks assessed/performed      Past Medical History  Diagnosis Date  . HTN (hypertension)   . Dyslipidemia   . Rheumatic fever     as a child  . Hypothyroidism     Past Surgical History  Procedure Laterality Date  . Rotator cuff repair  1991  . Tubal ligation  1973  . Colonoscopy N/A 02/02/2015    Procedure: COLONOSCOPY;  Surgeon: Rogene [place], MD;  [facility];  Service: Endoscopy;  Laterality: N/A;  930    There were no vitals filed for this visit.  Visit Diagnosis:  Tight fascia - Plan: Ot plan of care cert/re-cert  Stiffness of shoulder joint, left - Plan: Ot plan of care cert/re-cert  Decreased strength - Plan: Ot plan of care cert/re-cert  Pain in left shoulder - Plan: Ot plan of care cert/re-cert      Subjective Assessment - 05/01/15 1106    Subjective  S:  I go the MD tomorrow.  My arm is a little sore from the new exercises.   Currently in Pain? Yes   Pain Score 1    Pain Location Shoulder   Pain Orientation Left   Pain Descriptors / Indicators Sore   Pain Type Acute pain            [facility] OT Assessment - 05/01/15 0001    Assessment   Diagnosis s/p left RCR    Onset Date 03/10/15   Precautions   Precautions Shoulder   Type of Shoulder Precautions AAROM and progress as tolerated    Prior Function   Level of Independence Independent   Vocation Retired   Leisure primary caregiver for mother, walking   ADL   ADL comments increased ability to complete activities at waist height, increased abiilty with washing hair.  Difficulty reaching overhead, behind back, and behind head.   Observation/Other Assessments   Focus on Therapeutic Outcomes (FOTO)  FOTO scored 60% independent   AROM   Overall AROM Comments assessed in supine, ER/IR adducted   AROM Assessment Site Shoulder   Right/Left Shoulder Left   Left Shoulder Flexion 140 Degrees  seated 92   Left Shoulder ABduction 165 Degrees  85 seated   Left Shoulder Internal Rotation 90 Degrees  seated 90   Left Shoulder External Rotation 55 Degrees  seated 56   PROM   Overall PROM Comments Assessed in supine, ER/IR adducted (04/11/15)   Left Shoulder Flexion 152 Degrees  132   Left Shoulder ABduction 160 Degrees  98   Left Shoulder Internal Rotation 90 Degrees  90   Left Shoulder External Rotation  60 Degrees  72   Strength   Left Shoulder Flexion 3-/5   Left Shoulder ABduction 3-/5   Left Shoulder Internal Rotation 3-/5   Left Shoulder External Rotation 3-/5                  OT Treatments/Exercises (OP) - 05/01/15 0001    Exercises   Exercises Shoulder   Shoulder Exercises: Supine   Protraction PROM;5 reps   Horizontal ABduction PROM;5 reps   External Rotation PROM;5 reps   Internal Rotation PROM;5 reps   Flexion PROM;5 reps   ABduction PROM;5 reps   Shoulder Exercises: Standing   Protraction AROM;10 reps   Horizontal ABduction AROM;10 reps   External Rotation AROM;10 reps   Internal Rotation AROM;10 reps   Flexion AROM;10 reps   ABduction AROM;10 reps   Shoulder Exercises: ROM/Strengthening   UBE (Upper Arm Bike) 2' forward and 2' reverse at 1.0 intensity    Proximal Shoulder Strengthening, Seated 15X no rest breaks   Manual Therapy   Manual Therapy Myofascial release   Myofascial Release Myofascial release to left upper arm, scapular, trapezius, sternocleidomastoid, and shoulder region to decrease pain and restrictions and improve pain free mobility in her left shoulder                  OT Short Term Goals - 05/01/15 1130    OT SHORT TERM GOAL #1   Title Pt will be educated on HEP.    Time 6   Period Weeks   OT SHORT TERM GOAL #2   Title Pt will decrease pain to 4/10 during daily activities.    Time 6   Period Weeks   OT SHORT TERM GOAL #3   Title Pt will decrease fascial restrictions from mod to min amounts.    Time 6   Period Weeks   OT SHORT TERM GOAL #4   Title Pt will increase PROM to [facility] to increase ability to participate in donning shirts.    Time 6   Period Weeks   OT SHORT TERM GOAL #5   Title Pt will increase strength to 3-/5 to increase ability to increase ability to assist in performing daily tasks.    Time 6   Period Weeks   Status Achieved           OT Long Term Goals - 04/11/15 1038    OT LONG TERM GOAL #1   Title Pt will return to highest level of functioning and independence in all daily and leisure tasks.    Time 12   Period Weeks   Status On-going   OT LONG TERM GOAL #2   Title Pt will decrease pain to 1/10 or less during daily tasks.    Time 12   Period Weeks   Status On-going   OT LONG TERM GOAL #3   Title Pt will decrease fascial restrictions from min to trace amounts or less.    Time 12   Period Weeks   Status On-going   OT LONG TERM GOAL #4   Title Pt will increase AROM to WNL to increase ability to reach into overhead cabinets.    Time 12   Period Weeks   Status On-going   OT LONG TERM GOAL #5   Title Pt will increase strength to 4+/5 to improve ability to care for mother.    Time 12   Period Weeks   Status On-going  Plan - May 13, 2015 1141    Clinical  Impression Statement A:  Patient has made significant improvements in PROM and AROM in supine.  Her primary deficits are active movement above shoulder height and lifitng objects, therefore skilled occupational therapy intervention will focus on end range AROM in seated and strengthening in order to regain use of LUE as dominant with  daily activities.    Pt will benefit from skilled therapeutic intervention in order to improve on the following deficits (Retired) Decreased strength;Pain;Impaired UE functional use;Decreased activity tolerance;Decreased range of motion;Increased fascial restricitons;Impaired flexibility   Rehab Potential Good   OT Frequency 3x / week   OT Duration 12 weeks   OT Treatment/Interventions Self-care/ADL training;Passive range of motion;Patient/family education;Electrical Stimulation;Contrast Bath;Moist Heat;Therapeutic exercise;Manual Therapy;Therapeutic activities   Plan P:  Follow up on MD appointment, focus on end range AROM and strengthening.  Add 1 pound to supine exercises.    Consulted and Agree with Plan of Care Patient          G-Codes - 2015-05-13 1137    Functional Assessment Tool Used FOTO scored 60% independent 40% impaired   Functional Limitation Carrying, moving and handling objects   Carrying, Moving and Handling Objects Current Status [phone]) At least 40 percent but less than 60 percent impaired, limited or restricted   Carrying, Moving and Handling Objects Goal Status ([id number]) At least 20 percent but less than 40 percent impaired, limited or restricted      Problem List Patient Active Problem List   Diagnosis Date Noted  . HTN (hypertension) 07/30/2013  . Dyslipidemia 07/30/2013  . Chest pain- low risk Myoview 8/13 07/30/2013    Vangie Bicker, OTR/L [phone]  05-13-15, 11:52 AM  [place], [place], [address] Phone: [phone]   Fax:  [phone]  Occupational  Therapy Progress Note  Dates of Reporting Period: 04/06/15 to May 13, 2015 Objective Reports of Subjective Statement:please above  Objective Measurements: please see above  Goal Update: please see above - patient has met all short term goals and is working towards achieving long term goals. Plan:  A:  Patient has made significant improvements in PROM and AROM in supine.  Her primary deficits are active movement above shoulder height and lifitng objects, therefore skilled occupational therapy intervention will focus on end range AROM in seated and strengthening in order to regain use of LUE as dominant with  daily activities.    Reason Skilled Services are Required: Patient does not yet have functional AROM or strength in seated/standing position.  Patient is not able to use her left arm as dominant with her daily activities.   Vangie Bicker, OTR/L [phone]

## 2015-05-02 DIAGNOSIS — Z4789 Encounter for other orthopedic aftercare: Secondary | ICD-10-CM | POA: Diagnosis not present

## 2015-05-03 ENCOUNTER — Ambulatory Visit (HOSPITAL_COMMUNITY): Payer: Medicare Other

## 2015-05-03 ENCOUNTER — Encounter (HOSPITAL_COMMUNITY): Payer: Self-pay

## 2015-05-03 DIAGNOSIS — M6289 Other specified disorders of muscle: Secondary | ICD-10-CM

## 2015-05-03 DIAGNOSIS — M25612 Stiffness of left shoulder, not elsewhere classified: Secondary | ICD-10-CM | POA: Diagnosis not present

## 2015-05-03 DIAGNOSIS — M629 Disorder of muscle, unspecified: Secondary | ICD-10-CM | POA: Diagnosis not present

## 2015-05-03 DIAGNOSIS — M7582 Other shoulder lesions, left shoulder: Secondary | ICD-10-CM | POA: Diagnosis not present

## 2015-05-03 DIAGNOSIS — M6281 Muscle weakness (generalized): Secondary | ICD-10-CM | POA: Diagnosis not present

## 2015-05-03 DIAGNOSIS — R531 Weakness: Secondary | ICD-10-CM

## 2015-05-03 DIAGNOSIS — M25512 Pain in left shoulder: Secondary | ICD-10-CM | POA: Diagnosis not present

## 2015-05-03 NOTE — Therapy (Signed)
[place], [place], [address] Phone: [phone]   Fax:  [phone]  Occupational Therapy Treatment  [name] MRN: [id number] Date of Birth: [birth year] Referring Provider:  Elsie Saas, MD  Encounter Date: 05/03/2015      OT End of Session - 05/03/15 1148    Visit Number 21   Number of Visits 36   Date for OT Re-Evaluation 06/30/15  mini reassess on 05/30/15   Authorization Type Medicare A & B   Authorization Time Period before 30th visit   Authorization - Visit Number 21   Authorization - Number of Visits 30   OT Start Time 1105   OT Stop Time 1145   OT Time Calculation (min) 40 min   Activity Tolerance Patient tolerated treatment well   Behavior During Therapy [facility] for tasks assessed/performed      Past Medical History  Diagnosis Date  . HTN (hypertension)   . Dyslipidemia   . Rheumatic fever     as a child  . Hypothyroidism     Past Surgical History  Procedure Laterality Date  . Rotator cuff repair  1991  . Tubal ligation  1973  . Colonoscopy N/A 02/02/2015    Procedure: COLONOSCOPY;  Surgeon: Rogene [place], MD;  [facility];  Service: Endoscopy;  Laterality: N/A;  930    There were no vitals filed for this visit.  Visit Diagnosis:  Tight fascia  Stiffness of shoulder joint, left  Decreased strength      Subjective Assessment - 05/03/15 1130    Subjective  S: The dr. was pleased with my progress. He did say not to lift anything over a cup of coffee.    Currently in Pain? No/denies            [facility] OT Assessment - 05/03/15 1150    Assessment   Diagnosis s/p left RCR   Precautions   Precautions Shoulder   Type of Shoulder Precautions No lifting anything heavier than a cup of coffee. No yard work.                  OT Treatments/Exercises (OP) - 05/03/15 1133    Exercises   Exercises Shoulder   Shoulder Exercises:  Supine   Protraction PROM;5 reps;Strengthening;10 reps   Protraction Weight (lbs) 1   Horizontal ABduction PROM;5 reps;Strengthening;10 reps   Horizontal ABduction Weight (lbs) 1   External Rotation PROM;5 reps;Strengthening;10 reps   External Rotation Weight (lbs) 1   Internal Rotation PROM;5 reps;Strengthening;10 reps   Internal Rotation Weight (lbs) 1   Flexion PROM;5 reps;Strengthening;10 reps   Shoulder Flexion Weight (lbs) 1   ABduction PROM;5 reps;Strengthening;10 reps   Shoulder ABduction Weight (lbs) 1   Shoulder Exercises: Standing   Protraction Strengthening;10 reps   Protraction Weight (lbs) 1   Horizontal ABduction Strengthening;10 reps   Horizontal ABduction Weight (lbs) 1   External Rotation Strengthening;10 reps   External Rotation Weight (lbs) 1   Internal Rotation Strengthening;10 reps   Internal Rotation Weight (lbs) 1   Flexion Strengthening;10 reps   Shoulder Flexion Weight (lbs) 1   ABduction Strengthening;10 reps   Shoulder ABduction Weight (lbs) 1   Shoulder Exercises: ROM/Strengthening   UBE (Upper Arm Bike) 2' forward and 2' reverse at 1.0 intensity   "W" Arms 10X   X to V Arms 12X    Proximal Shoulder Strengthening, Supine 10X with 1#   Proximal  Shoulder Strengthening, Seated 15X no weight no rest breaks   Manual Therapy   Manual Therapy Myofascial release;Muscle Energy Technique   Myofascial Release Myofascial release to left upper arm, scapular, trapezius, sternocleidomastoid, and shoulder region to decrease pain and restrictions and improve pain free mobility in her left shoulder   Muscle Energy Technique Muscle energy techique to left anterior and medial deltoid to relax tone and muscle spasm and improve range of motion.                   OT Short Term Goals - 05/01/15 1130    OT SHORT TERM GOAL #1   Title Pt will be educated on HEP.    Time 6   Period Weeks   OT SHORT TERM GOAL #2   Title Pt will decrease pain to 4/10 during daily  activities.    Time 6   Period Weeks   OT SHORT TERM GOAL #3   Title Pt will decrease fascial restrictions from mod to min amounts.    Time 6   Period Weeks   OT SHORT TERM GOAL #4   Title Pt will increase PROM to [facility] to increase ability to participate in donning shirts.    Time 6   Period Weeks   OT SHORT TERM GOAL #5   Title Pt will increase strength to 3-/5 to increase ability to increase ability to assist in performing daily tasks.    Time 6   Period Weeks   Status Achieved           OT Long Term Goals - 04/11/15 1038    OT LONG TERM GOAL #1   Title Pt will return to highest level of functioning and independence in all daily and leisure tasks.    Time 12   Period Weeks   Status On-going   OT LONG TERM GOAL #2   Title Pt will decrease pain to 1/10 or less during daily tasks.    Time 12   Period Weeks   Status On-going   OT LONG TERM GOAL #3   Title Pt will decrease fascial restrictions from min to trace amounts or less.    Time 12   Period Weeks   Status On-going   OT LONG TERM GOAL #4   Title Pt will increase AROM to WNL to increase ability to reach into overhead cabinets.    Time 12   Period Weeks   Status On-going   OT LONG TERM GOAL #5   Title Pt will increase strength to 4+/5 to improve ability to care for mother.    Time 12   Period Weeks   Status On-going               Plan - 05/03/15 1149    Clinical Impression Statement A: Added 1# weight to supine and standing, added X to V arms, and W arms. Patient tolerated well.    Plan P: Add strengthening to HEP. Add overhead lacing.         Problem List Patient Active Problem List   Diagnosis Date Noted  . HTN (hypertension) 07/30/2013  . Dyslipidemia 07/30/2013  . Chest pain- low risk Myoview 8/13 07/30/2013    Ailene Ravel, OTR/L,CBIS  [phone]  05/03/2015, 11:52 AM  [place], [place], [address] Phone:  [phone]   Fax:  [phone]

## 2015-05-05 ENCOUNTER — Ambulatory Visit (HOSPITAL_COMMUNITY): Payer: Medicare Other | Admitting: Occupational Therapy

## 2015-05-05 ENCOUNTER — Encounter (HOSPITAL_COMMUNITY): Payer: Self-pay | Admitting: Occupational Therapy

## 2015-05-05 DIAGNOSIS — R531 Weakness: Secondary | ICD-10-CM

## 2015-05-05 DIAGNOSIS — M629 Disorder of muscle, unspecified: Secondary | ICD-10-CM

## 2015-05-05 DIAGNOSIS — M25612 Stiffness of left shoulder, not elsewhere classified: Secondary | ICD-10-CM | POA: Diagnosis not present

## 2015-05-05 DIAGNOSIS — M7582 Other shoulder lesions, left shoulder: Secondary | ICD-10-CM | POA: Diagnosis not present

## 2015-05-05 DIAGNOSIS — M6289 Other specified disorders of muscle: Secondary | ICD-10-CM

## 2015-05-05 DIAGNOSIS — M6281 Muscle weakness (generalized): Secondary | ICD-10-CM | POA: Diagnosis not present

## 2015-05-05 DIAGNOSIS — M25512 Pain in left shoulder: Secondary | ICD-10-CM

## 2015-05-05 NOTE — Therapy (Signed)
[place], [place], [address] Phone: [phone]   Fax:  [phone]  Occupational Therapy Treatment  [name] MRN: [id number] Date of Birth: 04-Oct-1941 Referring Provider:  Elsie Saas, MD  Encounter Date: 05/05/2015      OT End of Session - 05/05/15 1156    Visit Number 22   Number of Visits 36   Date for OT Re-Evaluation 06/30/15  mini reassess on 05/30/15   Authorization Type Medicare A & B   Authorization Time Period before 30th visit   Authorization - Visit Number 56   Authorization - Number of Visits 30   OT Start Time 1108   OT Stop Time 1152   OT Time Calculation (min) 44 min   Activity Tolerance Patient tolerated treatment well   Behavior During Therapy [facility] for tasks assessed/performed      Past Medical History  Diagnosis Date  . HTN (hypertension)   . Dyslipidemia   . Rheumatic fever     as a child  . Hypothyroidism     Past Surgical History  Procedure Laterality Date  . Rotator cuff repair  1991  . Tubal ligation  1973  . Colonoscopy N/A 02/02/2015    Procedure: COLONOSCOPY;  Surgeon: Rogene [place], MD;  [facility];  Service: Endoscopy;  Laterality: N/A;  930    There were no vitals filed for this visit.  Visit Diagnosis:  Tight fascia  Stiffness of shoulder joint, left  Decreased strength  Pain in left shoulder  Decreased range of motion of shoulder, left      Subjective Assessment - 05/05/15 1111    Subjective  S: I couldn't handle the ice after last session, it was too cold and was making the pain worse.    Currently in Pain? No/denies            [facility] OT Assessment - 05/05/15 1110    Assessment   Diagnosis s/p left RCR   Precautions   Precautions Shoulder   Type of Shoulder Precautions No lifting anything heavier than a cup of coffee. No yard work.                  OT Treatments/Exercises (OP) -  05/05/15 1130    Exercises   Exercises Shoulder   Shoulder Exercises: Supine   Protraction PROM;5 reps;Strengthening;10 reps   Protraction Weight (lbs) 1   Horizontal ABduction PROM;5 reps;Strengthening;10 reps   Horizontal ABduction Weight (lbs) 1   External Rotation PROM;5 reps;Strengthening;10 reps   External Rotation Weight (lbs) 1   Internal Rotation PROM;5 reps;Strengthening;10 reps   Internal Rotation Weight (lbs) 1   Flexion PROM;5 reps;Strengthening;10 reps   Shoulder Flexion Weight (lbs) 1   ABduction PROM;5 reps;Strengthening;10 reps   Shoulder ABduction Weight (lbs) 1   Shoulder Exercises: Standing   Protraction Strengthening;10 reps   Protraction Weight (lbs) 1   Horizontal ABduction Strengthening;10 reps   Horizontal ABduction Weight (lbs) 1   External Rotation Strengthening;10 reps   External Rotation Weight (lbs) 1   Internal Rotation Strengthening;10 reps   Internal Rotation Weight (lbs) 1   Flexion Strengthening;10 reps   Shoulder Flexion Weight (lbs) 1   ABduction Strengthening;10 reps   Shoulder ABduction Weight (lbs) 1   Shoulder Exercises: ROM/Strengthening   UBE (Upper Arm Bike) Level 1 3' forward 3' reverse   Over Head Lace 1'30"   "W" Arms 10X   X to  V Arms 12X    Proximal Shoulder Strengthening, Supine 10X with 1#   Proximal Shoulder Strengthening, Seated 10X with 1#    Manual Therapy   Manual Therapy Myofascial release;Muscle Energy Technique   Myofascial Release Myofascial release to left upper arm, scapular, trapezius, sternocleidomastoid, and shoulder region to decrease pain and restrictions and improve pain free mobility in her left shoulder   Muscle Energy Technique Muscle energy techique to left anterior and medial deltoid to relax tone and muscle spasm and improve range of motion.                   OT Short Term Goals - 05/01/15 1130    OT SHORT TERM GOAL #1   Title Pt will be educated on HEP.    Time 6   Period Weeks   OT  SHORT TERM GOAL #2   Title Pt will decrease pain to 4/10 during daily activities.    Time 6   Period Weeks   OT SHORT TERM GOAL #3   Title Pt will decrease fascial restrictions from mod to min amounts.    Time 6   Period Weeks   OT SHORT TERM GOAL #4   Title Pt will increase PROM to [facility] to increase ability to participate in donning shirts.    Time 6   Period Weeks   OT SHORT TERM GOAL #5   Title Pt will increase strength to 3-/5 to increase ability to increase ability to assist in performing daily tasks.    Time 6   Period Weeks   Status Achieved           OT Long Term Goals - 04/11/15 1038    OT LONG TERM GOAL #1   Title Pt will return to highest level of functioning and independence in all daily and leisure tasks.    Time 12   Period Weeks   Status On-going   OT LONG TERM GOAL #2   Title Pt will decrease pain to 1/10 or less during daily tasks.    Time 12   Period Weeks   Status On-going   OT LONG TERM GOAL #3   Title Pt will decrease fascial restrictions from min to trace amounts or less.    Time 12   Period Weeks   Status On-going   OT LONG TERM GOAL #4   Title Pt will increase AROM to WNL to increase ability to reach into overhead cabinets.    Time 12   Period Weeks   Status On-going   OT LONG TERM GOAL #5   Title Pt will increase strength to 4+/5 to improve ability to care for mother.    Time 12   Period Weeks   Status On-going               Plan - 05/05/15 1156    Clinical Impression Statement A: Added overhead lacing, added 1# weight to proximal shoulder strengthening in standing. Pt reports pain when using ice, so is now using heat the majority of the time.    Plan P: Increase strengthening repetitions to 12. Add ball on wall if able to tolerate.         Problem List Patient Active Problem List   Diagnosis Date Noted  . HTN (hypertension) 07/30/2013  . Dyslipidemia 07/30/2013  . Chest pain- low risk Myoview 8/13 07/30/2013    Guadelupe Sabin, OTR/L  [phone]  05/05/2015, 12:00 PM  [facility]  [place], [place], [address] Phone: [phone]   Fax:  [phone]

## 2015-05-08 ENCOUNTER — Ambulatory Visit (HOSPITAL_COMMUNITY): Payer: Medicare Other

## 2015-05-08 DIAGNOSIS — M25612 Stiffness of left shoulder, not elsewhere classified: Secondary | ICD-10-CM | POA: Diagnosis not present

## 2015-05-08 DIAGNOSIS — M6281 Muscle weakness (generalized): Secondary | ICD-10-CM | POA: Diagnosis not present

## 2015-05-08 DIAGNOSIS — R531 Weakness: Secondary | ICD-10-CM

## 2015-05-08 DIAGNOSIS — M629 Disorder of muscle, unspecified: Secondary | ICD-10-CM | POA: Diagnosis not present

## 2015-05-08 DIAGNOSIS — M7582 Other shoulder lesions, left shoulder: Secondary | ICD-10-CM | POA: Diagnosis not present

## 2015-05-08 DIAGNOSIS — M25512 Pain in left shoulder: Secondary | ICD-10-CM | POA: Diagnosis not present

## 2015-05-08 DIAGNOSIS — M6289 Other specified disorders of muscle: Secondary | ICD-10-CM

## 2015-05-08 NOTE — Therapy (Signed)
[place], [place], [address] Phone: [phone]   Fax:  [phone]  Occupational Therapy Treatment  [name] MRN: [id number] Date of Birth: Nov 03, 1940 Referring Provider:  Elsie Saas, MD  Encounter Date: 05/08/2015      OT End of Session - 05/08/15 1510    Visit Number 23   Number of Visits 36   Date for OT Re-Evaluation 06/30/15  mini reassess on 05/30/15   Authorization Type Medicare A & B   Authorization Time Period before 30th visit   Authorization - Visit Number 23   Authorization - Number of Visits 30   OT Start Time 1433   OT Stop Time 1515   OT Time Calculation (min) 42 min   Activity Tolerance Patient tolerated treatment well   Behavior During Therapy [facility] for tasks assessed/performed      Past Medical History  Diagnosis Date  . HTN (hypertension)   . Dyslipidemia   . Rheumatic fever     as a child  . Hypothyroidism     Past Surgical History  Procedure Laterality Date  . Rotator cuff repair  1991  . Tubal ligation  1973  . Colonoscopy N/A 02/02/2015    Procedure: COLONOSCOPY;  Surgeon: Rogene Houston, MD;  [facility];  Service: Endoscopy;  Laterality: N/A;  930    There were no vitals filed for this visit.  Visit Diagnosis:  Tight fascia  Stiffness of shoulder joint, left  Decreased strength      Subjective Assessment - 05/08/15 1510    Subjective  S: This weekend I went in the pool and I didn't know if I'm able to do anything with this arm so I didn't do too much with it.    Currently in Pain? No/denies                      OT Treatments/Exercises (OP) - 05/08/15 1453    Exercises   Exercises Shoulder   Shoulder Exercises: Supine   Protraction PROM;5 reps;Strengthening;12 reps   Protraction Weight (lbs) 1   Horizontal ABduction PROM;5 reps;Strengthening;12 reps   Horizontal ABduction Weight (lbs) 1   External  Rotation PROM;5 reps;Strengthening;12 reps   External Rotation Weight (lbs) 1   Internal Rotation PROM;5 reps;Strengthening;12 reps   Internal Rotation Weight (lbs) 1   Flexion PROM;5 reps;Strengthening;12 reps   Shoulder Flexion Weight (lbs) 1   ABduction PROM;5 reps;Strengthening;12 reps   Shoulder ABduction Weight (lbs) 1   Shoulder Exercises: Standing   Protraction Strengthening;12 reps   Protraction Weight (lbs) 1   Horizontal ABduction Strengthening;12 reps   Horizontal ABduction Weight (lbs) 1   External Rotation Strengthening;12 reps   External Rotation Weight (lbs) 1   Internal Rotation Strengthening;12 reps   Internal Rotation Weight (lbs) 1   Flexion Strengthening;12 reps   Shoulder Flexion Weight (lbs) 1   ABduction Strengthening;12 reps   Shoulder ABduction Weight (lbs) 1   Shoulder Exercises: ROM/Strengthening   UBE (Upper Arm Bike) Level 1 3' forward 3' reverse   Over Head Lace 2'   X to V Arms 12X    Proximal Shoulder Strengthening, Supine 12X with 1#   Proximal Shoulder Strengthening, Seated 12X with 1#   Ball on Wall 1' flexion 1' abduction green ball   Manual Therapy   Manual Therapy Myofascial release   Myofascial Release Myofascial release to left upper arm, scapular, trapezius, sternocleidomastoid,  and shoulder region to decrease pain and restrictions and improve pain free mobility in her left shoulder                  OT Short Term Goals - 05/01/15 1130    OT SHORT TERM GOAL #1   Title Pt will be educated on HEP.    Time 6   Period Weeks   OT SHORT TERM GOAL #2   Title Pt will decrease pain to 4/10 during daily activities.    Time 6   Period Weeks   OT SHORT TERM GOAL #3   Title Pt will decrease fascial restrictions from mod to min amounts.    Time 6   Period Weeks   OT SHORT TERM GOAL #4   Title Pt will increase PROM to [facility] to increase ability to participate in donning shirts.    Time 6   Period Weeks   OT SHORT TERM GOAL #5    Title Pt will increase strength to 3-/5 to increase ability to increase ability to assist in performing daily tasks.    Time 6   Period Weeks   Status Achieved           OT Long Term Goals - 04/11/15 1038    OT LONG TERM GOAL #1   Title Pt will return to highest level of functioning and independence in all daily and leisure tasks.    Time 12   Period Weeks   Status On-going   OT LONG TERM GOAL #2   Title Pt will decrease pain to 1/10 or less during daily tasks.    Time 12   Period Weeks   Status On-going   OT LONG TERM GOAL #3   Title Pt will decrease fascial restrictions from min to trace amounts or less.    Time 12   Period Weeks   Status On-going   OT LONG TERM GOAL #4   Title Pt will increase AROM to WNL to increase ability to reach into overhead cabinets.    Time 12   Period Weeks   Status On-going   OT LONG TERM GOAL #5   Title Pt will increase strength to 4+/5 to improve ability to care for mother.    Time 12   Period Weeks   Status On-going               Plan - 05/08/15 1511    Clinical Impression Statement A: Added ball on the wall and increased repitions with strengthening exercises. patient tolerated well. Continues to have increased pain at end stretch during passive stretching.    Plan P: Add cybex row and press.         Problem List Patient Active Problem List   Diagnosis Date Noted  . HTN (hypertension) 07/30/2013  . Dyslipidemia 07/30/2013  . Chest pain- low risk Myoview 8/13 07/30/2013    Ailene Ravel, OTR/L,CBIS  [phone]  05/08/2015, 3:16 PM  [place] [address] [place], [place], [address] Phone: [phone]   Fax:  [phone]

## 2015-05-10 ENCOUNTER — Ambulatory Visit (HOSPITAL_COMMUNITY): Payer: Medicare Other

## 2015-05-10 DIAGNOSIS — M25612 Stiffness of left shoulder, not elsewhere classified: Secondary | ICD-10-CM | POA: Diagnosis not present

## 2015-05-10 DIAGNOSIS — M6281 Muscle weakness (generalized): Secondary | ICD-10-CM | POA: Diagnosis not present

## 2015-05-10 DIAGNOSIS — M629 Disorder of muscle, unspecified: Secondary | ICD-10-CM

## 2015-05-10 DIAGNOSIS — M25512 Pain in left shoulder: Secondary | ICD-10-CM | POA: Diagnosis not present

## 2015-05-10 DIAGNOSIS — M7582 Other shoulder lesions, left shoulder: Secondary | ICD-10-CM | POA: Diagnosis not present

## 2015-05-10 DIAGNOSIS — M6289 Other specified disorders of muscle: Secondary | ICD-10-CM

## 2015-05-10 NOTE — Therapy (Signed)
[place], [place], [address] Phone: [phone]   Fax:  [phone]  Occupational Therapy Treatment  [name] MRN: [id number] Date of Birth: 1941/10/11 Referring Provider:  Elsie Saas, MD  Encounter Date: 05/10/2015      OT End of Session - 05/10/15 0849    Visit Number 24   Number of Visits 36   Date for OT Re-Evaluation 06/30/15  mini reassess on 05/30/15   Authorization Type Medicare A & B   Authorization Time Period before 30th visit   Authorization - Visit Number 23   Authorization - Number of Visits 30   OT Start Time 0803   OT Stop Time 0845   OT Time Calculation (min) 42 min   Activity Tolerance Patient tolerated treatment well   Behavior During Therapy [facility] for tasks assessed/performed      Past Medical History  Diagnosis Date  . HTN (hypertension)   . Dyslipidemia   . Rheumatic fever     as a child  . Hypothyroidism     Past Surgical History  Procedure Laterality Date  . Rotator cuff repair  1991  . Tubal ligation  1973  . Colonoscopy N/A 02/02/2015    Procedure: COLONOSCOPY;  Surgeon: Rogene Houston, MD;  [facility];  Service: Endoscopy;  Laterality: N/A;  930    There were no vitals filed for this visit.  Visit Diagnosis:  Tight fascia  Stiffness of shoulder joint, left          [facility] OT Assessment - 05/10/15 0828    Assessment   Diagnosis s/p left RCR   Precautions   Precautions Shoulder   Type of Shoulder Precautions No lifting anything heavier than a cup of coffee. No yard work.                  OT Treatments/Exercises (OP) - 05/10/15 2774    Exercises   Exercises Shoulder   Shoulder Exercises: Supine   Protraction PROM;5 reps;Strengthening;12 reps   Protraction Weight (lbs) 1   Horizontal ABduction PROM;5 reps;Strengthening;12 reps   Horizontal ABduction Weight (lbs) 1   External Rotation PROM;5  reps;Strengthening;12 reps   External Rotation Weight (lbs) 1   Internal Rotation PROM;5 reps;Strengthening;12 reps   Internal Rotation Weight (lbs) 1   Flexion PROM;5 reps;Strengthening;12 reps   Shoulder Flexion Weight (lbs) 1   ABduction PROM;5 reps;Strengthening;12 reps   Shoulder ABduction Weight (lbs) 1   Shoulder Exercises: Standing   Protraction Strengthening;12 reps   Protraction Weight (lbs) 1   Horizontal ABduction Strengthening;12 reps   Horizontal ABduction Weight (lbs) 1   External Rotation Strengthening;12 reps   External Rotation Weight (lbs) 1   Internal Rotation Strengthening;12 reps   Internal Rotation Weight (lbs) 1   Flexion Strengthening;12 reps   Shoulder Flexion Weight (lbs) 1   ABduction Strengthening;12 reps   Shoulder ABduction Weight (lbs) 1   Shoulder Exercises: ROM/Strengthening   Cybex Press 1 plate;10 reps   Cybex Row 1 plate;10 reps   "W" Arms 10X  using mirror   X to V Arms 12X with 1#   Proximal Shoulder Strengthening, Supine 12X with 1#   Proximal Shoulder Strengthening, Seated 12X with 1#   Shoulder Exercises: Stretch   Star Gazer Stretch 3 reps;10 seconds   Other Shoulder Stretches Wall stretch; external rotation stretch; 3 times; 10"hold   Other Shoulder Stretches External rotation with towel roll on  wall; 3 times; 10" hold   Manual Therapy   Manual Therapy Muscle Energy Technique;Myofascial release   Myofascial Release Myofascial release to left upper arm, scapular, trapezius, sternocleidomastoid, and shoulder region to decrease pain and restrictions and improve pain free mobility in her left shoulder   Muscle Energy Technique Muscle energy techique to left anterior and medial deltoid to relax tone and muscle spasm and improve range of motion.                 OT Education - 05/10/15 [phone]    Education provided Yes   Education Details ER stretches   Person(s) Educated Patient   Methods Explanation;Demonstration;Handout    Comprehension Returned demonstration;Verbalized understanding          OT Short Term Goals - 05/01/15 1130    OT SHORT TERM GOAL #1   Title Pt will be educated on HEP.    Time 6   Period Weeks   OT SHORT TERM GOAL #2   Title Pt will decrease pain to 4/10 during daily activities.    Time 6   Period Weeks   OT SHORT TERM GOAL #3   Title Pt will decrease fascial restrictions from mod to min amounts.    Time 6   Period Weeks   OT SHORT TERM GOAL #4   Title Pt will increase PROM to [name] to increase ability to participate in donning shirts.    Time 6   Period Weeks   OT SHORT TERM GOAL #5   Title Pt will increase strength to 3-/5 to increase ability to increase ability to assist in performing daily tasks.    Time 6   Period Weeks   Status Achieved           OT Long Term Goals - 04/11/15 1038    OT LONG TERM GOAL #1   Title Pt will return to highest level of functioning and independence in all daily and leisure tasks.    Time 12   Period Weeks   Status On-going   OT LONG TERM GOAL #2   Title Pt will decrease pain to 1/10 or less during daily tasks.    Time 12   Period Weeks   Status On-going   OT LONG TERM GOAL #3   Title Pt will decrease fascial restrictions from min to trace amounts or less.    Time 12   Period Weeks   Status On-going   OT LONG TERM GOAL #4   Title Pt will increase AROM to WNL to increase ability to reach into overhead cabinets.    Time 12   Period Weeks   Status On-going   OT LONG TERM GOAL #5   Title Pt will increase strength to 4+/5 to improve ability to care for mother.    Time 12   Period Weeks   Status On-going               Plan - 05/10/15 0849    Clinical Impression Statement A: Pt reports some soreness in left anterior deltoid region which may be from completing Tetherow on the wall for the first time last session. Added Cybex row and press at a very low weight this session. Pt tolerated well. Updated HEP with 3 new external  rotation stretches.    Plan P: Increase weight for cybex row and press. Cont with ball on the wall. Follow up on 3 new ER stretches.         Problem  List Patient Active Problem List   Diagnosis Date Noted  . HTN (hypertension) 07/30/2013  . Dyslipidemia 07/30/2013  . Chest pain- low risk Myoview 8/13 07/30/2013    Ailene Ravel, OTR/L,CBIS  [phone]  05/10/2015, 8:54 AM  [place], [place], [address] Phone: [phone]   Fax:  [phone]

## 2015-05-10 NOTE — Patient Instructions (Signed)
Shoulder abduction external rotation stretch  Lie on back with hands behind head. Drop elbows out to side. Hold 10 seconds. Complete 3 times.   Shoulder External Rotation at 90 Degree Abduction   Shoulder External Rotation with Towel Roll  Stand against wall with a towel roll along your spine.  Bring your arms up level with the floor and bend your elbows to 90 degrees.  First, squeeze your shoulders together.  Then, try to press the backs of your hands against the wall.  DO NOT overstretch into pain.  Keep it to a light stretch.  Hold 10 seconds. Repeat 3 times.    EXTERNAL ROTATION STRETCH  Place arm along edge of door as pictured.  Keep elbow and shoulder at 90 degree angles.  Gently lean the upper torso forward until a stretch is felt in the shoulder. Hold __10_ seconds; relax; repeat 3 times

## 2015-05-11 DIAGNOSIS — L820 Inflamed seborrheic keratosis: Secondary | ICD-10-CM | POA: Diagnosis not present

## 2015-05-12 ENCOUNTER — Encounter (HOSPITAL_COMMUNITY): Payer: Self-pay | Admitting: Occupational Therapy

## 2015-05-12 ENCOUNTER — Ambulatory Visit (HOSPITAL_COMMUNITY): Payer: Medicare Other | Admitting: Occupational Therapy

## 2015-05-12 DIAGNOSIS — M6289 Other specified disorders of muscle: Secondary | ICD-10-CM

## 2015-05-12 DIAGNOSIS — M25612 Stiffness of left shoulder, not elsewhere classified: Secondary | ICD-10-CM | POA: Diagnosis not present

## 2015-05-12 DIAGNOSIS — M629 Disorder of muscle, unspecified: Secondary | ICD-10-CM | POA: Diagnosis not present

## 2015-05-12 DIAGNOSIS — M6281 Muscle weakness (generalized): Secondary | ICD-10-CM | POA: Diagnosis not present

## 2015-05-12 DIAGNOSIS — M7582 Other shoulder lesions, left shoulder: Secondary | ICD-10-CM | POA: Diagnosis not present

## 2015-05-12 DIAGNOSIS — R531 Weakness: Secondary | ICD-10-CM

## 2015-05-12 DIAGNOSIS — M25512 Pain in left shoulder: Secondary | ICD-10-CM | POA: Diagnosis not present

## 2015-05-12 NOTE — Therapy (Signed)
[place], [place], [address] Phone: [phone]   Fax:  [phone]  Occupational Therapy Treatment  [name] MRN: [id number] Date of Birth: [birth year] Referring Provider:  Elsie Saas, MD  Encounter Date: 05/12/2015      OT End of Session - 05/12/15 1155    Visit Number 25   Number of Visits 36   Date for OT Re-Evaluation 06/30/15  mini reassess on 05/30/15   Authorization Type Medicare A & B   Authorization Time Period before 30th visit   Authorization - Visit Number 24   Authorization - Number of Visits 30   OT Start Time 1104   OT Stop Time 1152   OT Time Calculation (min) 48 min   Activity Tolerance Patient tolerated treatment well   Behavior During Therapy [facility] for tasks assessed/performed      Past Medical History  Diagnosis Date  . HTN (hypertension)   . Dyslipidemia   . Rheumatic fever     as a child  . Hypothyroidism     Past Surgical History  Procedure Laterality Date  . Rotator cuff repair  1991  . Tubal ligation  1973  . Colonoscopy N/A 02/02/2015    Procedure: COLONOSCOPY;  Surgeon: Rogene Houston, MD;  [facility];  Service: Endoscopy;  Laterality: N/A;  930    There were no vitals filed for this visit.  Visit Diagnosis:  Tight fascia  Stiffness of shoulder joint, left  Decreased strength  Pain in left shoulder  Decreased range of motion of shoulder, left      Subjective Assessment - 05/12/15 1105    Subjective  S: I just have this one sore spot today.    Currently in Pain? No/denies            [facility] OT Assessment - 05/12/15 1105    Assessment   Diagnosis s/p left RCR   Precautions   Precautions Shoulder   Type of Shoulder Precautions No lifting anything heavier than a cup of coffee. No yard work.                  OT Treatments/Exercises (OP) - 05/12/15 1127    Exercises   Exercises Shoulder   Shoulder Exercises: Supine   Protraction PROM;5 reps;Strengthening;12 reps   Protraction Weight (lbs) 1   Horizontal ABduction PROM;5 reps;Strengthening;12 reps   Horizontal ABduction Weight (lbs) 1   External Rotation PROM;5 reps;Strengthening;12 reps   External Rotation Weight (lbs) 1   Internal Rotation PROM;5 reps;Strengthening;12 reps   Internal Rotation Weight (lbs) 1   Flexion PROM;5 reps;Strengthening;12 reps   Shoulder Flexion Weight (lbs) 1   ABduction PROM;5 reps;Strengthening;12 reps   Shoulder ABduction Weight (lbs) 1   Shoulder Exercises: Standing   Protraction Strengthening;12 reps   Protraction Weight (lbs) 1   Horizontal ABduction Strengthening;12 reps   Horizontal ABduction Weight (lbs) 1   External Rotation Strengthening;12 reps   External Rotation Weight (lbs) 1   Internal Rotation Strengthening;12 reps   Internal Rotation Weight (lbs) 1   Flexion Strengthening;12 reps   Shoulder Flexion Weight (lbs) 1   ABduction Strengthening;12 reps   Shoulder ABduction Weight (lbs) 1   Shoulder Exercises: ROM/Strengthening   UBE (Upper Arm Bike) Level 1 3' forward 3' reverse   Cybex Press 2 plate;10 reps   Cybex Row 2 plate;10 reps   "W" Arms 10X  using mirror   X to  V Arms 12X with 1#   Proximal Shoulder Strengthening, Supine 12X with 1#   Proximal Shoulder Strengthening, Seated 12X with 1#   Ball on Wall 1' flexion 1' abduction green ball   Manual Therapy   Manual Therapy Muscle Energy Technique;Myofascial release   Myofascial Release Myofascial release to left upper arm, scapular, trapezius, sternocleidomastoid, and shoulder region to decrease pain and restrictions and improve pain free mobility in her left shoulder   Muscle Energy Technique Muscle energy techique to left anterior and medial deltoid to relax tone and muscle spasm and improve range of motion.                   OT Short Term Goals - 05/01/15 1130    OT SHORT TERM GOAL #1   Title Pt will  be educated on HEP.    Time 6   Period Weeks   OT SHORT TERM GOAL #2   Title Pt will decrease pain to 4/10 during daily activities.    Time 6   Period Weeks   OT SHORT TERM GOAL #3   Title Pt will decrease fascial restrictions from mod to min amounts.    Time 6   Period Weeks   OT SHORT TERM GOAL #4   Title Pt will increase PROM to [facility] to increase ability to participate in donning shirts.    Time 6   Period Weeks   OT SHORT TERM GOAL #5   Title Pt will increase strength to 3-/5 to increase ability to increase ability to assist in performing daily tasks.    Time 6   Period Weeks   Status Achieved           OT Long Term Goals - 04/11/15 1038    OT LONG TERM GOAL #1   Title Pt will return to highest level of functioning and independence in all daily and leisure tasks.    Time 12   Period Weeks   Status On-going   OT LONG TERM GOAL #2   Title Pt will decrease pain to 1/10 or less during daily tasks.    Time 12   Period Weeks   Status On-going   OT LONG TERM GOAL #3   Title Pt will decrease fascial restrictions from min to trace amounts or less.    Time 12   Period Weeks   Status On-going   OT LONG TERM GOAL #4   Title Pt will increase AROM to WNL to increase ability to reach into overhead cabinets.    Time 12   Period Weeks   Status On-going   OT LONG TERM GOAL #5   Title Pt will increase strength to 4+/5 to improve ability to care for mother.    Time 12   Period Weeks   Status On-going               Plan - 05/12/15 1156    Clinical Impression Statement A: Continued ball on wall, increased weight to 2 plate for cybex row/press. Pt reports minimal soreness in anterior deltoid region this session. Pt reports new external rotation stretches are going well at home.    Plan P: Increase supine strengthening to 15 repetitions. Cont with lacing, add 1# wrist weight.         Problem List Patient Active Problem List   Diagnosis Date Noted  . HTN  (hypertension) 07/30/2013  . Dyslipidemia 07/30/2013  . Chest pain- low risk Myoview 8/13 07/30/2013  Guadelupe Sabin, OTR/L  [phone]  05/12/2015, 11:58 AM  [place], [place], [address] Phone: [phone]   Fax:  [phone]

## 2015-05-15 ENCOUNTER — Encounter (HOSPITAL_COMMUNITY): Payer: Self-pay

## 2015-05-15 ENCOUNTER — Ambulatory Visit (HOSPITAL_COMMUNITY): Payer: Medicare Other

## 2015-05-15 DIAGNOSIS — M629 Disorder of muscle, unspecified: Secondary | ICD-10-CM | POA: Diagnosis not present

## 2015-05-15 DIAGNOSIS — M25612 Stiffness of left shoulder, not elsewhere classified: Secondary | ICD-10-CM | POA: Diagnosis not present

## 2015-05-15 DIAGNOSIS — R531 Weakness: Secondary | ICD-10-CM

## 2015-05-15 DIAGNOSIS — M6281 Muscle weakness (generalized): Secondary | ICD-10-CM | POA: Diagnosis not present

## 2015-05-15 DIAGNOSIS — M7582 Other shoulder lesions, left shoulder: Secondary | ICD-10-CM | POA: Diagnosis not present

## 2015-05-15 DIAGNOSIS — M6289 Other specified disorders of muscle: Secondary | ICD-10-CM

## 2015-05-15 DIAGNOSIS — M25512 Pain in left shoulder: Secondary | ICD-10-CM | POA: Diagnosis not present

## 2015-05-15 NOTE — Therapy (Signed)
[place], [place], [address] Phone: [phone]   Fax:  [phone]  Occupational Therapy Treatment  [name] MRN: [id number] Date of Birth: 1941-02-09 Referring Provider:  Elsie Saas, MD  Encounter Date: 05/15/2015      OT End of Session - 05/15/15 1525    Visit Number 26   Number of Visits 36   Date for OT Re-Evaluation 06/30/15  mini reassess on 05/30/15   Authorization Type Medicare A & B   Authorization Time Period before 30th visit   Authorization - Visit Number 25   Authorization - Number of Visits 30   OT Start Time 1430   OT Stop Time 1515   OT Time Calculation (min) 45 min   Activity Tolerance Patient tolerated treatment well   Behavior During Therapy [facility] for tasks assessed/performed      Past Medical History  Diagnosis Date  . HTN (hypertension)   . Dyslipidemia   . Rheumatic fever     as a child  . Hypothyroidism     Past Surgical History  Procedure Laterality Date  . Rotator cuff repair  1991  . Tubal ligation  1973  . Colonoscopy N/A 02/02/2015    Procedure: COLONOSCOPY;  Surgeon: Rogene Houston, MD;  [facility];  Service: Endoscopy;  Laterality: N/A;  930    There were no vitals filed for this visit.  Visit Diagnosis:  Tight fascia  Stiffness of shoulder joint, left  Decreased strength      Subjective Assessment - 05/15/15 1453    Subjective  S: It was painful this morning. And when I went to do my stretches it hurt some too. It feels better now though.    Currently in Pain? No/denies            [facility] OT Assessment - 05/15/15 1454    Assessment   Diagnosis s/p left RCR   Precautions   Precautions Shoulder   Type of Shoulder Precautions No lifting anything heavier than a cup of coffee. No yard work.                  OT Treatments/Exercises (OP) - 05/15/15 1454    Exercises   Exercises Shoulder   Shoulder Exercises: Supine   Protraction PROM;5 reps;Strengthening;15 reps   Protraction Weight (lbs) 1   Horizontal ABduction PROM;5 reps;Strengthening;15 reps   Horizontal ABduction Weight (lbs) 1   External Rotation PROM;5 reps;Strengthening;15 reps   External Rotation Weight (lbs) 1   Internal Rotation PROM;5 reps;Strengthening;15 reps   Internal Rotation Weight (lbs) 1   Flexion PROM;5 reps;Strengthening;15 reps   Shoulder Flexion Weight (lbs) 1   ABduction PROM;5 reps;Strengthening;15 reps   Shoulder ABduction Weight (lbs) 1   Shoulder Exercises: Standing   Protraction Strengthening;15 reps   Protraction Weight (lbs) 1   Horizontal ABduction Strengthening;15 reps   Horizontal ABduction Weight (lbs) 1   External Rotation Strengthening;15 reps   External Rotation Weight (lbs) 1   Internal Rotation Strengthening;15 reps   Internal Rotation Weight (lbs) 1   Flexion Strengthening;15 reps   Shoulder Flexion Weight (lbs) 1   ABduction Strengthening;15 reps   Shoulder ABduction Weight (lbs) 1   Shoulder Exercises: ROM/Strengthening   Over Head Lace 1'30" with 1#   "W" Arms 15X   with mirror   X to V Arms 15X with 1#   Proximal Shoulder Strengthening, Supine 15X with 1#  Proximal Shoulder Strengthening, Seated 15X with 1#   Ball on Wall 1' flexion 1' abduction green ball   Manual Therapy   Manual Therapy Muscle Energy Technique   Myofascial Release Myofascial release to left upper arm, scapular, trapezius, sternocleidomastoid, and shoulder region to decrease pain and restrictions and improve pain free mobility in her left shoulder                  OT Short Term Goals - 05/15/15 1527    OT SHORT TERM GOAL #1   Title Pt will be educated on HEP.    Time 6   Period Weeks   OT SHORT TERM GOAL #2   Title Pt will decrease pain to 4/10 during daily activities.    Time 6   Period Weeks   OT SHORT TERM GOAL #3   Title Pt will decrease fascial restrictions from mod to  min amounts.    Time 6   Period Weeks   OT SHORT TERM GOAL #4   Title Pt will increase PROM to [facility] to increase ability to participate in donning shirts.    Time 6   Period Weeks   OT SHORT TERM GOAL #5   Title Pt will increase strength to 3-/5 to increase ability to increase ability to assist in performing daily tasks.    Time 6   Period Weeks           OT Long Term Goals - 04/11/15 1038    OT LONG TERM GOAL #1   Title Pt will return to highest level of functioning and independence in all daily and leisure tasks.    Time 12   Period Weeks   Status On-going   OT LONG TERM GOAL #2   Title Pt will decrease pain to 1/10 or less during daily tasks.    Time 12   Period Weeks   Status On-going   OT LONG TERM GOAL #3   Title Pt will decrease fascial restrictions from min to trace amounts or less.    Time 12   Period Weeks   Status On-going   OT LONG TERM GOAL #4   Title Pt will increase AROM to WNL to increase ability to reach into overhead cabinets.    Time 12   Period Weeks   Status On-going   OT LONG TERM GOAL #5   Title Pt will increase strength to 4+/5 to improve ability to care for mother.    Time 12   Period Weeks   Status On-going               Plan - 05/15/15 1525    Clinical Impression Statement A: Increased supine and standing strengthening exercises to 15 repetitions and added 1# wrist weight to overhead lacing activity.   Plan P: Cont to work on increasing strength within MD orders.         Problem List Patient Active Problem List   Diagnosis Date Noted  . HTN (hypertension) 07/30/2013  . Dyslipidemia 07/30/2013  . Chest pain- low risk Myoview 8/13 07/30/2013     Ailene Ravel, OTR/L,CBIS  [phone]  05/15/2015, 3:34 PM  [place] [address] [place], [place], [address] Phone: [phone]   Fax:  [phone]

## 2015-05-17 ENCOUNTER — Encounter (HOSPITAL_COMMUNITY): Payer: Self-pay | Admitting: Occupational Therapy

## 2015-05-17 ENCOUNTER — Ambulatory Visit (HOSPITAL_COMMUNITY): Payer: Medicare Other | Admitting: Occupational Therapy

## 2015-05-17 DIAGNOSIS — M7582 Other shoulder lesions, left shoulder: Secondary | ICD-10-CM | POA: Diagnosis not present

## 2015-05-17 DIAGNOSIS — M6289 Other specified disorders of muscle: Secondary | ICD-10-CM

## 2015-05-17 DIAGNOSIS — M629 Disorder of muscle, unspecified: Secondary | ICD-10-CM | POA: Diagnosis not present

## 2015-05-17 DIAGNOSIS — M25612 Stiffness of left shoulder, not elsewhere classified: Secondary | ICD-10-CM | POA: Diagnosis not present

## 2015-05-17 DIAGNOSIS — M25512 Pain in left shoulder: Secondary | ICD-10-CM | POA: Diagnosis not present

## 2015-05-17 DIAGNOSIS — M6281 Muscle weakness (generalized): Secondary | ICD-10-CM | POA: Diagnosis not present

## 2015-05-17 DIAGNOSIS — R531 Weakness: Secondary | ICD-10-CM

## 2015-05-17 NOTE — Therapy (Signed)
[place], [place], [address] Phone: [phone]   Fax:  [phone]  Occupational Therapy Treatment  [name] MRN: [id number] Date of Birth: 01/09/1941 Referring Provider:  Elsie Saas, MD  Encounter Date: 05/17/2015      OT End of Session - 05/17/15 1143    Visit Number 27   Number of Visits 36   Date for OT Re-Evaluation 06/30/15  mini reassess on 05/30/15   Authorization Type Medicare A & B   Authorization Time Period before 30th visit   Authorization - Visit Number 26   Authorization - Number of Visits 30   OT Start Time 1100   OT Stop Time 1148   OT Time Calculation (min) 48 min   Activity Tolerance Patient tolerated treatment well   Behavior During Therapy [facility] for tasks assessed/performed      Past Medical History  Diagnosis Date  . HTN (hypertension)   . Dyslipidemia   . Rheumatic fever     as a child  . Hypothyroidism     Past Surgical History  Procedure Laterality Date  . Rotator cuff repair  1991  . Tubal ligation  1973  . Colonoscopy N/A 02/02/2015    Procedure: COLONOSCOPY;  Surgeon: Rogene Houston, MD;  [facility];  Service: Endoscopy;  Laterality: N/A;  930    There were no vitals filed for this visit.  Visit Diagnosis:  Tight fascia  Stiffness of shoulder joint, left  Decreased strength  Pain in left shoulder  Decreased range of motion of shoulder, left      Subjective Assessment - 05/17/15 1050    Subjective  S: It's been feeling pretty good, I think I go back to the doctor around the second week of August.   Currently in Pain? No/denies            [facility] OT Assessment - 05/17/15 1049    Assessment   Diagnosis s/p left RCR   Precautions   Precautions Shoulder   Type of Shoulder Precautions No lifting anything heavier than a cup of coffee. No yard work.                  OT Treatments/Exercises (OP) -  05/17/15 1100    Exercises   Exercises Shoulder   Shoulder Exercises: Supine   Protraction PROM;5 reps;Strengthening;15 reps   Protraction Weight (lbs) 1   Horizontal ABduction PROM;5 reps;Strengthening;15 reps   Horizontal ABduction Weight (lbs) 1   External Rotation PROM;5 reps;Strengthening;15 reps   External Rotation Weight (lbs) 1   Internal Rotation PROM;5 reps;Strengthening;15 reps   Internal Rotation Weight (lbs) 1   Flexion PROM;5 reps;Strengthening;15 reps   Shoulder Flexion Weight (lbs) 1   ABduction PROM;5 reps;Strengthening;15 reps   Shoulder ABduction Weight (lbs) 1   Shoulder Exercises: Standing   Protraction Strengthening;15 reps   Protraction Weight (lbs) 1   Horizontal ABduction Strengthening;15 reps   Horizontal ABduction Weight (lbs) 1   External Rotation Strengthening;15 reps   External Rotation Weight (lbs) 1   Internal Rotation Strengthening;15 reps   Internal Rotation Weight (lbs) 1   Flexion Strengthening;15 reps   Shoulder Flexion Weight (lbs) 1   ABduction Strengthening;15 reps   Shoulder ABduction Weight (lbs) 1   Shoulder Exercises: ROM/Strengthening   UBE (Upper Arm Bike) Level 1 3' forward 3' reverse   Cybex Press 2 plate;10 reps   Cybex Row 2 plate;10 reps   "  W" Arms 15X    X to V Arms 15X with 1#  using mirror   Proximal Shoulder Strengthening, Supine 15X with 1#   Proximal Shoulder Strengthening, Seated 15X with 1#   Ball on Wall 1' flexion 1' abduction green ball   Manual Therapy   Manual Therapy Muscle Energy Technique   Myofascial Release Myofascial release to left upper arm, scapular, trapezius, sternocleidomastoid, and shoulder region to decrease pain and restrictions and improve pain free mobility in her left shoulder                  OT Short Term Goals - 05/15/15 1527    OT SHORT TERM GOAL #1   Title Pt will be educated on HEP.    Time 6   Period Weeks   OT SHORT TERM GOAL #2   Title Pt will decrease pain to 4/10  during daily activities.    Time 6   Period Weeks   OT SHORT TERM GOAL #3   Title Pt will decrease fascial restrictions from mod to min amounts.    Time 6   Period Weeks   OT SHORT TERM GOAL #4   Title Pt will increase PROM to [facility] to increase ability to participate in donning shirts.    Time 6   Period Weeks   OT SHORT TERM GOAL #5   Title Pt will increase strength to 3-/5 to increase ability to increase ability to assist in performing daily tasks.    Time 6   Period Weeks           OT Long Term Goals - 04/11/15 1038    OT LONG TERM GOAL #1   Title Pt will return to highest level of functioning and independence in all daily and leisure tasks.    Time 12   Period Weeks   Status On-going   OT LONG TERM GOAL #2   Title Pt will decrease pain to 1/10 or less during daily tasks.    Time 12   Period Weeks   Status On-going   OT LONG TERM GOAL #3   Title Pt will decrease fascial restrictions from min to trace amounts or less.    Time 12   Period Weeks   Status On-going   OT LONG TERM GOAL #4   Title Pt will increase AROM to WNL to increase ability to reach into overhead cabinets.    Time 12   Period Weeks   Status On-going   OT LONG TERM GOAL #5   Title Pt will increase strength to 4+/5 to improve ability to care for mother.    Time 12   Period Weeks   Status On-going               Plan - 05/17/15 1143    Clinical Impression Statement A: Continued strengthening exercises this session. Pt tolerated treatment well. Pt has increased tightness at medial scapula area this session. Pt reports she has been doing some light housework and has not had any pain or increased stiffness.    Plan P: Continue with strengthening w/in MD orders. Add prone hughston exercises if able to tolerate.         Problem List Patient Active Problem List   Diagnosis Date Noted  . HTN (hypertension) 07/30/2013  . Dyslipidemia 07/30/2013  . Chest pain- low risk Myoview 8/13 07/30/2013     Guadelupe Sabin, OTR/L  [phone]  05/17/2015, 12:56 PM  [facility]  [place], [place], [address] Phone: [phone]   Fax:  [phone]

## 2015-05-19 ENCOUNTER — Encounter (HOSPITAL_COMMUNITY): Payer: Self-pay | Admitting: Occupational Therapy

## 2015-05-19 ENCOUNTER — Ambulatory Visit (HOSPITAL_COMMUNITY): Payer: Medicare Other | Admitting: Occupational Therapy

## 2015-05-19 DIAGNOSIS — M25612 Stiffness of left shoulder, not elsewhere classified: Secondary | ICD-10-CM

## 2015-05-19 DIAGNOSIS — M6289 Other specified disorders of muscle: Secondary | ICD-10-CM

## 2015-05-19 DIAGNOSIS — M629 Disorder of muscle, unspecified: Secondary | ICD-10-CM | POA: Diagnosis not present

## 2015-05-19 DIAGNOSIS — M6281 Muscle weakness (generalized): Secondary | ICD-10-CM | POA: Diagnosis not present

## 2015-05-19 DIAGNOSIS — M25512 Pain in left shoulder: Secondary | ICD-10-CM

## 2015-05-19 DIAGNOSIS — R531 Weakness: Secondary | ICD-10-CM

## 2015-05-19 DIAGNOSIS — M7582 Other shoulder lesions, left shoulder: Secondary | ICD-10-CM | POA: Diagnosis not present

## 2015-05-19 NOTE — Therapy (Signed)
[place], [place], [address] Phone: [phone]   Fax:  [phone]  Occupational Therapy Treatment  [name] MRN: [id number] Date of Birth: 23-Nov-1940 Referring Provider:  Elsie Saas, MD  Encounter Date: 05/19/2015      OT End of Session - 05/19/15 1302    Visit Number 28   Number of Visits 36   Date for OT Re-Evaluation 06/30/15  mini reassess on 05/30/15   Authorization Type Medicare A & B   Authorization Time Period before 30th visit   Authorization - Visit Number 27   Authorization - Number of Visits 30   OT Start Time 1100   OT Stop Time 1144   OT Time Calculation (min) 44 min   Activity Tolerance Patient tolerated treatment well   Behavior During Therapy [facility] for tasks assessed/performed      Past Medical History  Diagnosis Date  . HTN (hypertension)   . Dyslipidemia   . Rheumatic fever     as a child  . Hypothyroidism     Past Surgical History  Procedure Laterality Date  . Rotator cuff repair  1991  . Tubal ligation  1973  . Colonoscopy N/A 02/02/2015    Procedure: COLONOSCOPY;  Surgeon: Rogene Houston, MD;  [facility];  Service: Endoscopy;  Laterality: N/A;  930    There were no vitals filed for this visit.  Visit Diagnosis:  Tight fascia  Stiffness of shoulder joint, left  Decreased strength  Pain in left shoulder  Decreased range of motion of shoulder, left      Subjective Assessment - 05/19/15 1101    Subjective  S: I have one sore spot, but I wouldn't call it painful.    Currently in Pain? No/denies            [facility] OT Assessment - 05/19/15 1101    Assessment   Diagnosis s/p left RCR   Precautions   Precautions Shoulder   Type of Shoulder Precautions No lifting anything heavier than a cup of coffee. No yard work.                  OT Treatments/Exercises (OP) - 05/19/15 1104    Exercises   Exercises  Shoulder   Shoulder Exercises: Supine   Protraction PROM;5 reps;Strengthening;20 reps   Protraction Weight (lbs) 1   Horizontal ABduction PROM;5 reps;Strengthening;20 reps   Horizontal ABduction Weight (lbs) 1   External Rotation PROM;5 reps;Strengthening;20 reps   External Rotation Weight (lbs) 1   Internal Rotation PROM;5 reps;Strengthening;20 reps   Internal Rotation Weight (lbs) 1   Flexion PROM;5 reps;Strengthening;20 reps   Shoulder Flexion Weight (lbs) 1   ABduction PROM;5 reps;Strengthening;20 reps   Shoulder ABduction Weight (lbs) 1   Shoulder Exercises: Prone   Other Prone Exercises Prone hughston exercises 5 positions 10X each   Shoulder Exercises: Standing   Protraction Strengthening;15 reps   Protraction Weight (lbs) 1   Horizontal ABduction Strengthening;15 reps   Horizontal ABduction Weight (lbs) 1   External Rotation Strengthening;15 reps   External Rotation Weight (lbs) 1   Internal Rotation Strengthening;15 reps   Internal Rotation Weight (lbs) 1   Flexion Strengthening;15 reps   Shoulder Flexion Weight (lbs) 1   ABduction Strengthening;15 reps   Shoulder ABduction Weight (lbs) 1   Shoulder Exercises: ROM/Strengthening   Cybex Press 2 plate;10 reps   Cybex Row 2 plate;10 reps   X  to V Arms 15X with 1#   Proximal Shoulder Strengthening, Supine 15X with 1#   Proximal Shoulder Strengthening, Seated 15X with 1#   Ball on Wall 1' flexion 1' abduction green ball   Manual Therapy   Manual Therapy Myofascial release   Myofascial Release Myofascial release to left upper arm, scapular, trapezius, sternocleidomastoid, and shoulder region to decrease pain and restrictions and improve pain free mobility in her left shoulder                  OT Short Term Goals - 05/15/15 1527    OT SHORT TERM GOAL #1   Title Pt will be educated on HEP.    Time 6   Period Weeks   OT SHORT TERM GOAL #2   Title Pt will decrease pain to 4/10 during daily activities.    Time  6   Period Weeks   OT SHORT TERM GOAL #3   Title Pt will decrease fascial restrictions from mod to min amounts.    Time 6   Period Weeks   OT SHORT TERM GOAL #4   Title Pt will increase PROM to [facility] to increase ability to participate in donning shirts.    Time 6   Period Weeks   OT SHORT TERM GOAL #5   Title Pt will increase strength to 3-/5 to increase ability to increase ability to assist in performing daily tasks.    Time 6   Period Weeks           OT Long Term Goals - 04/11/15 1038    OT LONG TERM GOAL #1   Title Pt will return to highest level of functioning and independence in all daily and leisure tasks.    Time 12   Period Weeks   Status On-going   OT LONG TERM GOAL #2   Title Pt will decrease pain to 1/10 or less during daily tasks.    Time 12   Period Weeks   Status On-going   OT LONG TERM GOAL #3   Title Pt will decrease fascial restrictions from min to trace amounts or less.    Time 12   Period Weeks   Status On-going   OT LONG TERM GOAL #4   Title Pt will increase AROM to WNL to increase ability to reach into overhead cabinets.    Time 12   Period Weeks   Status On-going   OT LONG TERM GOAL #5   Title Pt will increase strength to 4+/5 to improve ability to care for mother.    Time 12   Period Weeks   Status On-going               Plan - 05/19/15 1303    Clinical Impression Statement A: Continued strengthening exericses. Added prone hughston exercises, increase supine repetitions to 20. Pt tolerated treatment well.    Plan P: Continue strengthening within MD orders. Resume missed exercises. Increase standing reps to 20 if able to tolerate.         Problem List Patient Active Problem List   Diagnosis Date Noted  . HTN (hypertension) 07/30/2013  . Dyslipidemia 07/30/2013  . Chest pain- low risk Myoview 8/13 07/30/2013    Kaitlyn Baker, OTR/L  [id number]  05/19/2015, 1:05 PM  [place] [address] [place], [place], [address] Phone: [phone]   Fax:  [phone]

## 2015-05-22 ENCOUNTER — Encounter (HOSPITAL_COMMUNITY): Payer: Medicare Other | Admitting: Physical Therapy

## 2015-05-24 ENCOUNTER — Ambulatory Visit (HOSPITAL_COMMUNITY): Payer: Medicare Other | Attending: Orthopedic Surgery | Admitting: Specialist

## 2015-05-24 DIAGNOSIS — M25512 Pain in left shoulder: Secondary | ICD-10-CM

## 2015-05-24 DIAGNOSIS — M25612 Stiffness of left shoulder, not elsewhere classified: Secondary | ICD-10-CM | POA: Insufficient documentation

## 2015-05-24 DIAGNOSIS — M7582 Other shoulder lesions, left shoulder: Secondary | ICD-10-CM | POA: Diagnosis not present

## 2015-05-24 DIAGNOSIS — M629 Disorder of muscle, unspecified: Secondary | ICD-10-CM | POA: Insufficient documentation

## 2015-05-24 DIAGNOSIS — M6281 Muscle weakness (generalized): Secondary | ICD-10-CM | POA: Insufficient documentation

## 2015-05-24 DIAGNOSIS — R531 Weakness: Secondary | ICD-10-CM

## 2015-05-24 NOTE — Patient Instructions (Signed)
Ice Massage:   Rub ice cube directly on sore area of left anterior shoulder/upper arm.  Rub continuously.  You will feel skin get a burning sensation and then become numb.   Complete as needed throughout the day.

## 2015-05-24 NOTE — Therapy (Signed)
[place], [place], [address] Phone: [phone]   Fax:  [phone]  Occupational Therapy Treatment  [name] MRN: [id number] Date of Birth: 10-04-1941 Referring Provider:  Elsie Saas, MD  Encounter Date: 05/24/2015      OT End of Session - 05/24/15 1355    Visit Number 29   Number of Visits 36   Date for OT Re-Evaluation 06/30/15  mini reassess on 05/30/15   Authorization Type Medicare A & B   Authorization Time Period before 30th visit   Authorization - Visit Number 29   Authorization - Number of Visits 30   OT Start Time 2595   OT Stop Time 1348   OT Time Calculation (min) 43 min   Activity Tolerance Patient tolerated treatment well   Behavior During Therapy [facility] for tasks assessed/performed      Past Medical History  Diagnosis Date  . HTN (hypertension)   . Dyslipidemia   . Rheumatic fever     as a child  . Hypothyroidism     Past Surgical History  Procedure Laterality Date  . Rotator cuff repair  1991  . Tubal ligation  1973  . Colonoscopy N/A 02/02/2015    Procedure: COLONOSCOPY;  Surgeon: Rogene Houston, MD;  [facility];  Service: Endoscopy;  Laterality: N/A;  930    There were no vitals filed for this visit.  Visit Diagnosis:  Decreased strength  Pain in left shoulder      Subjective Assessment - 05/24/15 1305    Subjective  S:  Im a little sore but doing well.   Currently in Pain? Yes   Pain Score 2    Pain Location Shoulder   Pain Orientation Left   Pain Descriptors / Indicators Sore   Pain Type Acute pain            [facility] OT Assessment - 05/24/15 0001    Assessment   Diagnosis s/p left RCR   Precautions   Precautions Shoulder   Type of Shoulder Precautions No lifting anything heavier than a cup of coffee. No yard work.                  OT Treatments/Exercises (OP) - 05/24/15 0001    Exercises   Exercises  Shoulder   Shoulder Exercises: Supine   Protraction PROM;5 reps;Strengthening;20 reps   Protraction Weight (lbs) 1   Horizontal ABduction PROM;5 reps;Strengthening;20 reps   Horizontal ABduction Weight (lbs) 1   External Rotation PROM;5 reps;Strengthening;20 reps   External Rotation Weight (lbs) 1   Internal Rotation PROM;5 reps;Strengthening;20 reps   Internal Rotation Weight (lbs) 1   Flexion PROM;5 reps;Strengthening;20 reps   Shoulder Flexion Weight (lbs) 1   ABduction PROM;5 reps;Strengthening;20 reps   Shoulder ABduction Weight (lbs) 1   Shoulder Exercises: Standing   Protraction Strengthening;20 reps   Protraction Weight (lbs) 1   Horizontal ABduction Strengthening;20 reps   Horizontal ABduction Weight (lbs) 1   External Rotation Strengthening;20 reps   External Rotation Weight (lbs) 1   Internal Rotation Strengthening;20 reps   Internal Rotation Weight (lbs) 1   Flexion Strengthening;20 reps   Shoulder Flexion Weight (lbs) 1   ABduction Strengthening;20 reps   Shoulder ABduction Weight (lbs) 1   Shoulder Exercises: ROM/Strengthening   Cybex Press 2.5 plate;10 reps   Cybex Row 2.5 plate;10 reps   Proximal Shoulder Strengthening, Seated 15X with 1#   Modalities  Modalities Cryotherapy   Cryotherapy   Number Minutes Cryotherapy 2 Minutes   Cryotherapy Location Shoulder  anterior shoulder along biceps tendo   Type of Cryotherapy Ice massage   Manual Therapy   Manual Therapy Myofascial release   Myofascial Release Myofascial release to left upper arm, scapular, trapezius, sternocleidomastoid, and shoulder region to decrease pain and restrictions and improve pain free mobility in her left shoulder                OT Education - 05/24/15 1354    Education provided Yes   Education Details ice massage to left anterior upper arm in biceps region.   Person(s) Educated Patient   Methods Explanation;Demonstration   Comprehension Verbalized understanding           OT Short Term Goals - 05/15/15 1527    OT SHORT TERM GOAL #1   Title Pt will be educated on HEP.    Time 6   Period Weeks   OT SHORT TERM GOAL #2   Title Pt will decrease pain to 4/10 during daily activities.    Time 6   Period Weeks   OT SHORT TERM GOAL #3   Title Pt will decrease fascial restrictions from mod to min amounts.    Time 6   Period Weeks   OT SHORT TERM GOAL #4   Title Pt will increase PROM to [facility] to increase ability to participate in donning shirts.    Time 6   Period Weeks   OT SHORT TERM GOAL #5   Title Pt will increase strength to 3-/5 to increase ability to increase ability to assist in performing daily tasks.    Time 6   Period Weeks           OT Long Term Goals - 04/11/15 1038    OT LONG TERM GOAL #1   Title Pt will return to highest level of functioning and independence in all daily and leisure tasks.    Time 12   Period Weeks   Status On-going   OT LONG TERM GOAL #2   Title Pt will decrease pain to 1/10 or less during daily tasks.    Time 12   Period Weeks   Status On-going   OT LONG TERM GOAL #3   Title Pt will decrease fascial restrictions from min to trace amounts or less.    Time 12   Period Weeks   Status On-going   OT LONG TERM GOAL #4   Title Pt will increase AROM to WNL to increase ability to reach into overhead cabinets.    Time 12   Period Weeks   Status On-going   OT LONG TERM GOAL #5   Title Pt will increase strength to 4+/5 to improve ability to care for mother.    Time 12   Period Weeks   Status On-going               Plan - 05/24/15 1356    Clinical Impression Statement A:  improved to 20 repetitions with standing strengthening, increased to 2 1/2 plates with cybex.  Patient requires verbal guidance for proper form with flexion and abduction.  Added ice massage to anterior shoulder/upper arm region to decrease pain after therapeutic exercises.  educated patient on completing ice massage at home   Plan P:   resume prone exercises, update g code and complete 10 day progress note.         Problem List Patient Active Problem  List   Diagnosis Date Noted  . HTN (hypertension) 07/30/2013  . Dyslipidemia 07/30/2013  . Chest pain- low risk Myoview 8/13 07/30/2013    Vangie Bicker, OTR/L [phone]  05/24/2015, 1:59 PM  [place] [address] [place], [place], [address] Phone: [phone]   Fax:  [phone]

## 2015-05-26 ENCOUNTER — Ambulatory Visit (HOSPITAL_COMMUNITY): Payer: Medicare Other | Admitting: Specialist

## 2015-05-26 DIAGNOSIS — M6281 Muscle weakness (generalized): Secondary | ICD-10-CM | POA: Diagnosis not present

## 2015-05-26 DIAGNOSIS — M629 Disorder of muscle, unspecified: Secondary | ICD-10-CM | POA: Diagnosis not present

## 2015-05-26 DIAGNOSIS — M25612 Stiffness of left shoulder, not elsewhere classified: Secondary | ICD-10-CM

## 2015-05-26 DIAGNOSIS — R531 Weakness: Secondary | ICD-10-CM

## 2015-05-26 DIAGNOSIS — M25512 Pain in left shoulder: Secondary | ICD-10-CM | POA: Diagnosis not present

## 2015-05-26 DIAGNOSIS — M7582 Other shoulder lesions, left shoulder: Secondary | ICD-10-CM | POA: Diagnosis not present

## 2015-05-26 NOTE — Therapy (Signed)
[place], [place], [address] Phone: [phone]   Fax:  [phone]  Occupational Therapy Treatment/Recertification  [name] MRN: [id number] Date of Birth: 27-Apr-1941 Referring Provider:  Elsie Saas, MD  Encounter Date: 05/26/2015      OT End of Session - 05/26/15 1024    Visit Number 30   Number of Visits 42   Date for OT Re-Evaluation 07/25/15  mini reassessment on 06/24/15   Authorization Type Medicare A & B   Authorization Time Period before 40th visit and progress note   Authorization - Visit Number 71   Authorization - Number of Visits 40   OT Start Time [phone]   OT Stop Time 1015   OT Time Calculation (min) 41 min   Activity Tolerance Patient tolerated treatment well   Behavior During Therapy [facility] for tasks assessed/performed      Past Medical History  Diagnosis Date  . HTN (hypertension)   . Dyslipidemia   . Rheumatic fever     as a child  . Hypothyroidism     Past Surgical History  Procedure Laterality Date  . Rotator cuff repair  1991  . Tubal ligation  1973  . Colonoscopy N/A 02/02/2015    Procedure: COLONOSCOPY;  Surgeon: Rogene Houston, MD;  [facility];  Service: Endoscopy;  Laterality: N/A;  930    There were no vitals filed for this visit.  Visit Diagnosis:  Decreased strength - Plan: Ot plan of care cert/re-cert  Pain in left shoulder - Plan: Ot plan of care cert/re-cert  Stiffness of shoulder joint, left - Plan: Ot plan of care cert/re-cert      Subjective Assessment - 05/26/15 0935    Subjective  S:     Patient is accompained by: Family member            [facility] OT Assessment - 05/26/15 0001    Assessment   Diagnosis s/p left RCR   Precautions   Precautions Shoulder   Type of Shoulder Precautions No lifting anything heavier than a cup of coffee. No yard work.   Prior Function   Level of Independence Independent    Vocation Retired   Leisure primary caregiver for mother, walking   ADL   ADL comments difficulty fastening bra in back, arm tires while doing overhead activities such as drying her hair, and she is not able to lift anything heavier than a coffee cup per md.   Observation/Other Assessments   Focus on Therapeutic Outcomes (FOTO)  FOT scored 55%  patient reports 65% indepence in 65%   AROM   Overall AROM Comments assessed in seated (05/01/15 supine)     Left Shoulder Flexion 145 Degrees  140   Left Shoulder ABduction 145 Degrees  165   Left Shoulder Internal Rotation 90 Degrees  90   Left Shoulder External Rotation 65 Degrees  55   PROM   Overall PROM Comments PROM is WFL in supine and seated   Strength   Overall Strength Comments assessed in seated, ER/IR with shoulder adducted  (05/01/15)   Left Shoulder Flexion 4-/5  3-/5   Left Shoulder ABduction 4-/5  3-/5   Left Shoulder Internal Rotation 4+/5  3-/5   Left Shoulder External Rotation 4+/5  3-/5                  OT Treatments/Exercises (OP) - 05/26/15 0001    Shoulder Exercises:  ROM/Strengthening   Cybex Press 2.5 plate;15 reps   Cybex Row 2.5 plate;15 reps   Ball on Wall 1' flexion 1' abduction green ball   Manual Therapy   Manual Therapy Myofascial release   Myofascial Release Myofascial release to left upper arm, scapular, trapezius, sternocleidomastoid, and shoulder region to decrease pain and restrictions and improve pain free mobility in her left shoulder                  OT Short Term Goals - 05/26/15 0175    OT SHORT TERM GOAL #1   Title Pt will be educated on HEP.    Time 6   Period Weeks   OT SHORT TERM GOAL #2   Title Pt will decrease pain to 4/10 during daily activities.    Time 6   Period Weeks   OT SHORT TERM GOAL #3   Title Pt will decrease fascial restrictions from mod to min amounts.    Time 6   Period Weeks   OT SHORT TERM GOAL #4   Title Pt will increase PROM to [facility] to  increase ability to participate in donning shirts.    Time 6   Period Weeks   OT SHORT TERM GOAL #5   Title Pt will increase strength to 3-/5 to increase ability to increase ability to assist in performing daily tasks.    Time 6   Period Weeks           OT Long Term Goals - 05/26/15 1025    OT LONG TERM GOAL #1   Title Pt will return to highest level of functioning and independence in all daily and leisure tasks.    Time 12   Period Weeks   Status On-going   OT LONG TERM GOAL #2   Title Pt will decrease pain to 1/10 or less during daily tasks.    Time 12   Period Weeks   Status Achieved   OT LONG TERM GOAL #3   Title Pt will decrease fascial restrictions from min to trace amounts or less.    Time 12   Period Weeks   Status On-going   OT LONG TERM GOAL #4   Title Pt will increase AROM to WNL to increase ability to reach into overhead cabinets.    Time 12   Period Weeks   Status On-going   OT LONG TERM GOAL #5   Title Pt will increase strength to 4+/5 to improve ability to care for mother.    Time 12   Period Weeks   Status Partially Met               Plan - 05/26/15 1025    Clinical Impression Statement A:  Patient is progressing towards long term goals.  She has met all short term goals.  PROM is WFL in seated, AROM is [facility], however patient is lacking end range AROM which makes reaching overhead into a cabinet and fastening her bra behind her back difficult.  Her strength is improving, however she continues to have a lifting restriction by MD that limits further lifting.  Paitent will benefit from continued skilled OT intervention to improve AROM and strength to WNL in order to return to prior level of independence with ADLs.    Pt will benefit from skilled therapeutic intervention in order to improve on the following deficits (Retired) Decreased strength;Pain;Impaired UE functional use;Decreased activity tolerance;Decreased range of motion;Increased fascial  restricitons;Impaired flexibility   Rehab Potential Good  OT Frequency 3x / week   OT Duration 4 weeks   OT Treatment/Interventions Self-care/ADL training;Passive range of motion;Patient/family education;Electrical Stimulation;Contrast Bath;Moist Heat;Therapeutic exercise;Manual Therapy;Therapeutic activities   Plan P:  Focus on end range AROM and strengthening.     Consulted and Agree with Plan of Care Patient          G-Codes - 06-20-15 1005    Functional Assessment Tool Used FOT scored 55% independent 45% impaired, clinical judgement 65% independent.   Functional Limitation Carrying, moving and handling objects   Carrying, Moving and Handling Objects Current Status [phone]) At least 40 percent but less than 60 percent impaired, limited or restricted   Carrying, Moving and Handling Objects Goal Status ([id number]) At least 20 percent but less than 40 percent impaired, limited or restricted      Problem List Patient Active Problem List   Diagnosis Date Noted  . HTN (hypertension) 07/30/2013  . Dyslipidemia 07/30/2013  . Chest pain- low risk Myoview 8/13 07/30/2013    Vangie Bicker, OTR/L [phone]  06-20-2015, 10:34 AM Occupational Therapy Progress Note  Dates of Reporting Period: 05/01/15 to 2015-06-20  Objective Reports of Subjective Statement: "i still cant fasten my bra."  Objective Measurements: see above  Goal Update: see above Plan: see above  Reason Skilled Services are Required: Patient continues to have deficits with full AROM against gravity and with strength.  Patient will benefit from continued skilled service to improve AROM and strength to WNL for improved Independence with daily activities and in order to resume care of her 6 year old mother.  Vangie Bicker, OTR/L  [place] [address] [place], [place], [address] Phone: [phone]   Fax:  [phone]

## 2015-05-29 ENCOUNTER — Ambulatory Visit (HOSPITAL_COMMUNITY): Payer: Medicare Other | Admitting: Occupational Therapy

## 2015-05-29 ENCOUNTER — Encounter (HOSPITAL_COMMUNITY): Payer: Self-pay | Admitting: Occupational Therapy

## 2015-05-29 DIAGNOSIS — M7582 Other shoulder lesions, left shoulder: Secondary | ICD-10-CM | POA: Diagnosis not present

## 2015-05-29 DIAGNOSIS — R531 Weakness: Secondary | ICD-10-CM

## 2015-05-29 DIAGNOSIS — M6281 Muscle weakness (generalized): Secondary | ICD-10-CM | POA: Diagnosis not present

## 2015-05-29 DIAGNOSIS — M25612 Stiffness of left shoulder, not elsewhere classified: Secondary | ICD-10-CM | POA: Diagnosis not present

## 2015-05-29 DIAGNOSIS — M629 Disorder of muscle, unspecified: Secondary | ICD-10-CM

## 2015-05-29 DIAGNOSIS — M25512 Pain in left shoulder: Secondary | ICD-10-CM | POA: Diagnosis not present

## 2015-05-29 DIAGNOSIS — M6289 Other specified disorders of muscle: Secondary | ICD-10-CM

## 2015-05-29 NOTE — Therapy (Signed)
[place], [place], [address] Phone: [phone]   Fax:  [phone]  Occupational Therapy Treatment  [name] MRN: [id number] Date of Birth: 08-Dec-1940 Referring Provider:  Elsie Saas, MD  Encounter Date: 05/29/2015      OT End of Session - 05/29/15 1517    Visit Number 31   Number of Visits 42   Date for OT Re-Evaluation 07/25/15  mini reassessment on 06/24/15   Authorization Type Medicare A & B   Authorization Time Period before 40th visit and progress note   Authorization - Visit Number 31   Authorization - Number of Visits 40   OT Start Time 1433   OT Stop Time 1516   OT Time Calculation (min) 43 min   Activity Tolerance Patient tolerated treatment well   Behavior During Therapy [facility] for tasks assessed/performed      Past Medical History  Diagnosis Date  . HTN (hypertension)   . Dyslipidemia   . Rheumatic fever     as a child  . Hypothyroidism     Past Surgical History  Procedure Laterality Date  . Rotator cuff repair  1991  . Tubal ligation  1973  . Colonoscopy N/A 02/02/2015    Procedure: COLONOSCOPY;  Surgeon: Rogene Houston, MD;  [facility];  Service: Endoscopy;  Laterality: N/A;  930    There were no vitals filed for this visit.  Visit Diagnosis:  Decreased strength  Pain in left shoulder  Stiffness of shoulder joint, left  Tight fascia  Decreased range of motion of shoulder, left      Subjective Assessment - 05/29/15 1434    Subjective  S: I'm going back to see the doctor tomorrow.    Currently in Pain? No/denies            [facility] OT Assessment - 05/29/15 1435    Assessment   Diagnosis s/p left RCR   Precautions   Precautions Shoulder   Type of Shoulder Precautions No lifting anything heavier than a cup of coffee. No yard work.                  OT Treatments/Exercises (OP) - 05/29/15 1436    Exercises    Exercises Shoulder   Shoulder Exercises: Supine   Protraction PROM;5 reps;Strengthening;20 reps   Protraction Weight (lbs) 1   Horizontal ABduction PROM;5 reps;Strengthening;20 reps   Horizontal ABduction Weight (lbs) 1   External Rotation PROM;5 reps;Strengthening;20 reps   External Rotation Weight (lbs) 1   Internal Rotation PROM;5 reps;Strengthening;20 reps   Internal Rotation Weight (lbs) 1   Flexion PROM;5 reps;Strengthening;20 reps   Shoulder Flexion Weight (lbs) 1   ABduction PROM;5 reps;Strengthening;20 reps   Shoulder ABduction Weight (lbs) 1   Shoulder Exercises: Standing   Protraction Strengthening;20 reps   Protraction Weight (lbs) 1   Horizontal ABduction Strengthening;20 reps   Horizontal ABduction Weight (lbs) 1   External Rotation Strengthening;20 reps   External Rotation Weight (lbs) 1   Internal Rotation Strengthening;20 reps   Internal Rotation Weight (lbs) 1   Flexion Strengthening;20 reps   Shoulder Flexion Weight (lbs) 1   ABduction Strengthening;20 reps   Shoulder ABduction Weight (lbs) 1   Shoulder Exercises: ROM/Strengthening   UBE (Upper Arm Bike) Level 1 3' forward 3' reverse   "W" Arms 15X  with 1# weight   X to V Arms 15X with 1#   Proximal Shoulder  Strengthening, Supine 15X with 1#   Proximal Shoulder Strengthening, Seated 15X with 1#   Manual Therapy   Manual Therapy Myofascial release   Myofascial Release Myofascial release to left upper arm, scapular, trapezius, sternocleidomastoid, and shoulder region to decrease pain and restrictions and improve pain free mobility in her left shoulder                  OT Short Term Goals - 05/26/15 0630    OT SHORT TERM GOAL #1   Title Pt will be educated on HEP.    Time 6   Period Weeks   OT SHORT TERM GOAL #2   Title Pt will decrease pain to 4/10 during daily activities.    Time 6   Period Weeks   OT SHORT TERM GOAL #3   Title Pt will decrease fascial restrictions from mod to min amounts.     Time 6   Period Weeks   OT SHORT TERM GOAL #4   Title Pt will increase PROM to [facility] to increase ability to participate in donning shirts.    Time 6   Period Weeks   OT SHORT TERM GOAL #5   Title Pt will increase strength to 3-/5 to increase ability to increase ability to assist in performing daily tasks.    Time 6   Period Weeks           OT Long Term Goals - 05/26/15 1601    OT LONG TERM GOAL #1   Title Pt will return to highest level of functioning and independence in all daily and leisure tasks.    Time 12   Period Weeks   Status On-going   OT LONG TERM GOAL #2   Title Pt will decrease pain to 1/10 or less during daily tasks.    Time 12   Period Weeks   Status Achieved   OT LONG TERM GOAL #3   Title Pt will decrease fascial restrictions from min to trace amounts or less.    Time 12   Period Weeks   Status On-going   OT LONG TERM GOAL #4   Title Pt will increase AROM to WNL to increase ability to reach into overhead cabinets.    Time 12   Period Weeks   Status On-going   OT LONG TERM GOAL #5   Title Pt will increase strength to 4+/5 to improve ability to care for mother.    Time 12   Period Weeks   Status Partially Met               Plan - 05/29/15 1517    Clinical Impression Statement A: Resumed strengthening exercises this session. Added 1# weight to w arms. Pt tolerated treatment well. Provided pt with copy of re-evaluation including measurements to take to MD appt.    Plan P: Follow up on MD visit. Increase weight to 2# if MD allows. Resume prone hughston exercises.         Problem List Patient Active Problem List   Diagnosis Date Noted  . HTN (hypertension) 07/30/2013  . Dyslipidemia 07/30/2013  . Chest pain- low risk Myoview 8/13 07/30/2013    Guadelupe Sabin, OTR/L  [phone]  05/29/2015, 3:20 PM  [place] [address] [place], [place], [address] Phone: [phone]   Fax:   [phone]

## 2015-05-30 DIAGNOSIS — Z4789 Encounter for other orthopedic aftercare: Secondary | ICD-10-CM | POA: Diagnosis not present

## 2015-06-02 ENCOUNTER — Encounter (HOSPITAL_COMMUNITY): Payer: Self-pay

## 2015-06-02 ENCOUNTER — Ambulatory Visit (HOSPITAL_COMMUNITY): Payer: Medicare Other

## 2015-06-02 DIAGNOSIS — M629 Disorder of muscle, unspecified: Secondary | ICD-10-CM | POA: Diagnosis not present

## 2015-06-02 DIAGNOSIS — M7582 Other shoulder lesions, left shoulder: Secondary | ICD-10-CM | POA: Diagnosis not present

## 2015-06-02 DIAGNOSIS — M6281 Muscle weakness (generalized): Secondary | ICD-10-CM | POA: Diagnosis not present

## 2015-06-02 DIAGNOSIS — R531 Weakness: Secondary | ICD-10-CM

## 2015-06-02 DIAGNOSIS — M25612 Stiffness of left shoulder, not elsewhere classified: Secondary | ICD-10-CM

## 2015-06-02 DIAGNOSIS — M25512 Pain in left shoulder: Secondary | ICD-10-CM | POA: Diagnosis not present

## 2015-06-02 NOTE — Therapy (Signed)
[place], [place], [address] Phone: [phone]   Fax:  [phone]  Occupational Therapy Treatment  [name] MRN: [id number] Date of Birth: December 11, 1940 Referring Provider:  Redmond School, MD  Encounter Date: 06/02/2015      OT End of Session - 06/02/15 1019    Visit Number 32   Number of Visits 42   Date for OT Re-Evaluation 07/25/15  mini reassessment on 06/24/15   Authorization Type Medicare A & B   Authorization Time Period before 40th visit and progress note   Authorization - Visit Number 4   Authorization - Number of Visits 40   OT Start Time 0933   OT Stop Time 1017   OT Time Calculation (min) 44 min   Activity Tolerance Patient tolerated treatment well   Behavior During Therapy [facility] for tasks assessed/performed      Past Medical History  Diagnosis Date  . HTN (hypertension)   . Dyslipidemia   . Rheumatic fever     as a child  . Hypothyroidism     Past Surgical History  Procedure Laterality Date  . Rotator cuff repair  1991  . Tubal ligation  1973  . Colonoscopy N/A 02/02/2015    Procedure: COLONOSCOPY;  Surgeon: Rogene Houston, MD;  [facility];  Service: Endoscopy;  Laterality: N/A;  930    There were no vitals filed for this visit.  Visit Diagnosis:  Decreased strength  Stiffness of shoulder joint, left      Subjective Assessment - 06/02/15 0941    Subjective  S: The Doctor thought everything was great.   Currently in Pain? No/denies            [facility] OT Assessment - 06/02/15 0941    Assessment   Diagnosis s/p left RCR   Precautions   Precautions None   Type of Shoulder Precautions Increase weight each week. 1lb as tolerated.                  OT Treatments/Exercises (OP) - 06/02/15 0942    Exercises   Exercises Shoulder   Shoulder Exercises: Supine   Protraction PROM;5 reps;Strengthening;12 reps   Protraction  Weight (lbs) 2   Horizontal ABduction PROM;5 reps;Strengthening;12 reps   Horizontal ABduction Weight (lbs) 2   External Rotation PROM;5 reps;Strengthening;12 reps   External Rotation Weight (lbs) 2   Internal Rotation PROM;5 reps;Strengthening;12 reps   Internal Rotation Weight (lbs) 2   Flexion PROM;5 reps;Strengthening;12 reps   Shoulder Flexion Weight (lbs) 2   ABduction PROM;5 reps;Strengthening;12 reps   Shoulder ABduction Weight (lbs) 2   Shoulder Exercises: Prone   Other Prone Exercises [place] exercises; 1#; 12X   Shoulder Exercises: Standing   Protraction Strengthening;12 reps   Protraction Weight (lbs) 2   Horizontal ABduction Strengthening;12 reps   Horizontal ABduction Weight (lbs) 2   External Rotation Strengthening;12 reps   External Rotation Weight (lbs) 2   Internal Rotation Strengthening;12 reps   Internal Rotation Weight (lbs) 2   Flexion Strengthening;12 reps   Shoulder Flexion Weight (lbs) 2   ABduction Strengthening;12 reps   Shoulder ABduction Weight (lbs) 2   Shoulder Exercises: ROM/Strengthening   Cybex Press 3 plate;10 reps   Cybex Row 3 plate;10 reps   "W" Arms 10X with 2#   X to V Arms 10X with 2#   Proximal Shoulder Strengthening, Supine 12X with 2#   Proximal Shoulder Strengthening,  Seated 12X with 2#   Ball on Wall 1' flexion 1' abduction red ball   Manual Therapy   Manual Therapy Passive ROM   Passive ROM PROM completed to LUE to increase ROM and joint mobility.                  OT Short Term Goals - 05/26/15 0958    OT SHORT TERM GOAL #1   Title Pt will be educated on HEP.    Time 6   Period Weeks   OT SHORT TERM GOAL #2   Title Pt will decrease pain to 4/10 during daily activities.    Time 6   Period Weeks   OT SHORT TERM GOAL #3   Title Pt will decrease fascial restrictions from mod to min amounts.    Time 6   Period Weeks   OT SHORT TERM GOAL #4   Title Pt will increase PROM to [facility] to increase ability to participate  in donning shirts.    Time 6   Period Weeks   OT SHORT TERM GOAL #5   Title Pt will increase strength to 3-/5 to increase ability to increase ability to assist in performing daily tasks.    Time 6   Period Weeks           OT Long Term Goals - 06/02/15 1022    OT LONG TERM GOAL #1   Title Pt will return to highest level of functioning and independence in all daily and leisure tasks.    Time 12   Period Weeks   Status On-going   OT LONG TERM GOAL #2   Title Pt will decrease pain to 1/10 or less during daily tasks.    Time 12   Period Weeks   OT LONG TERM GOAL #3   Title Pt will decrease fascial restrictions from min to trace amounts or less.    Time 12   Period Weeks   Status On-going   OT LONG TERM GOAL #4   Title Pt will increase AROM to WNL to increase ability to reach into overhead cabinets.    Time 12   Period Weeks   Status On-going   OT LONG TERM GOAL #5   Title Pt will increase strength to 4+/5 to improve ability to care for mother.    Time 12   Period Weeks   Status Partially Met               Plan - 06/02/15 1020    Clinical Impression Statement A: MD appt went well and physician was pleased with progress. Orders to increase weight each week as able to tolerate. Increased weight to 2#, increased weight with all exercises as well as theraband resistance. Pt tolerated well with reports of muscle fatigue.    Plan P: Resume strengthening exercises. Complete Cybex row/press earlier in tx session versus towards end. MFR PRN.        Problem List Patient Active Problem List   Diagnosis Date Noted  . HTN (hypertension) 07/30/2013  . Dyslipidemia 07/30/2013  . Chest pain- low risk Myoview 8/13 07/30/2013    Ailene Ravel, OTR/L,CBIS  [phone]  06/02/2015, 10:23 AM  [place], [place], [address] Phone: [phone]   Fax:  [phone]

## 2015-06-06 ENCOUNTER — Encounter: Payer: Self-pay | Admitting: *Deleted

## 2015-06-06 ENCOUNTER — Ambulatory Visit (HOSPITAL_COMMUNITY): Payer: Medicare Other

## 2015-06-06 DIAGNOSIS — M25512 Pain in left shoulder: Secondary | ICD-10-CM | POA: Diagnosis not present

## 2015-06-06 DIAGNOSIS — M25612 Stiffness of left shoulder, not elsewhere classified: Secondary | ICD-10-CM | POA: Diagnosis not present

## 2015-06-06 DIAGNOSIS — R531 Weakness: Secondary | ICD-10-CM

## 2015-06-06 DIAGNOSIS — M7582 Other shoulder lesions, left shoulder: Secondary | ICD-10-CM | POA: Diagnosis not present

## 2015-06-06 DIAGNOSIS — M6281 Muscle weakness (generalized): Secondary | ICD-10-CM | POA: Diagnosis not present

## 2015-06-06 DIAGNOSIS — M629 Disorder of muscle, unspecified: Secondary | ICD-10-CM | POA: Diagnosis not present

## 2015-06-06 NOTE — Therapy (Signed)
[place], [place], [address] Phone: [phone]   Fax:  [phone]  Occupational Therapy Treatment  [name] MRN: [id number] Date of Birth: [birth year] Referring Provider:  Elsie Saas, MD  Encounter Date: 06/06/2015      OT End of Session - 06/06/15 0926    Visit Number 33   Number of Visits 42   Date for OT Re-Evaluation 07/25/15  mini reassessment on 06/24/15   Authorization Type Medicare A & B   Authorization Time Period before 40th visit and progress note   Authorization - Visit Number 33   Authorization - Number of Visits 40   OT Start Time 0850   OT Stop Time 0930   OT Time Calculation (min) 40 min   Activity Tolerance Patient tolerated treatment well   Behavior During Therapy [facility] for tasks assessed/performed      Past Medical History  Diagnosis Date  . HTN (hypertension)   . Dyslipidemia   . Rheumatic fever     as a child  . Hypothyroidism   . History of cardiac monitoring     Past Surgical History  Procedure Laterality Date  . Rotator cuff repair  1991  . Tubal ligation  1973  . Colonoscopy N/A 02/02/2015    Procedure: COLONOSCOPY;  Surgeon: Rogene Houston, MD;  [facility];  Service: Endoscopy;  Laterality: N/A;  930    There were no vitals filed for this visit.  Visit Diagnosis:  Decreased strength  Stiffness of shoulder joint, left      Subjective Assessment - 06/06/15 0900    Subjective  S: I'm just a little sore. My back is mostly sore today.   Currently in Pain? No/denies            [facility] OT Assessment - 06/06/15 0901    Assessment   Diagnosis s/p left RCR   Precautions   Precautions None   Type of Shoulder Precautions Increase weight each week. 1lb as tolerated.                  OT Treatments/Exercises (OP) - 06/06/15 0901    Exercises   Exercises Shoulder   Shoulder Exercises: Supine   Protraction  PROM;5 reps;Strengthening;12 reps   Protraction Weight (lbs) 2   Horizontal ABduction PROM;5 reps;Strengthening;12 reps   Horizontal ABduction Weight (lbs) 2   External Rotation PROM;5 reps;Strengthening;12 reps   External Rotation Weight (lbs) 2   Internal Rotation PROM;5 reps;Strengthening;12 reps   Internal Rotation Weight (lbs) 2   Flexion PROM;5 reps;Strengthening;12 reps   Shoulder Flexion Weight (lbs) 2   ABduction PROM;5 reps;Strengthening;12 reps   Shoulder ABduction Weight (lbs) 2   Shoulder Exercises: Prone   Other Prone Exercises Hughston exercises; 1#; 12X   Shoulder Exercises: Standing   Protraction Strengthening;12 reps   Protraction Weight (lbs) 2   Horizontal ABduction Strengthening;12 reps   Horizontal ABduction Weight (lbs) 2   External Rotation Strengthening;12 reps   External Rotation Weight (lbs) 2   Internal Rotation Strengthening;12 reps   Internal Rotation Weight (lbs) 2   Flexion Strengthening;12 reps   Shoulder Flexion Weight (lbs) 2   ABduction Strengthening;12 reps   Shoulder ABduction Weight (lbs) 2   Extension Theraband;12 reps   Theraband Level (Shoulder Extension) Level 4 (Blue)   Row Theraband;12 reps   Theraband Level (Shoulder Row) Level 4 (Blue)   Retraction Theraband;12 reps   Theraband Level (  Shoulder Retraction) Level 4 (Blue)   Shoulder Exercises: ROM/Strengthening   Cybex Press 3 plate;10 reps   Cybex Row 3 plate;10 reps   "W" Arms 10X with 2#   X to V Arms 10X with 2#   Proximal Shoulder Strengthening, Supine 12X with 2#   Proximal Shoulder Strengthening, Seated 12X with 2#   Ball on Wall 1' flexion 1' abduction red ball   Manual Therapy   Manual Therapy Passive ROM   Passive ROM PROM completed to LUE to increase ROM and joint mobility.                  OT Short Term Goals - 05/26/15 0958    OT SHORT TERM GOAL #1   Title Pt will be educated on HEP.    Time 6   Period Weeks   OT SHORT TERM GOAL #2   Title Pt will  decrease pain to 4/10 during daily activities.    Time 6   Period Weeks   OT SHORT TERM GOAL #3   Title Pt will decrease fascial restrictions from mod to min amounts.    Time 6   Period Weeks   OT SHORT TERM GOAL #4   Title Pt will increase PROM to [facility] to increase ability to participate in donning shirts.    Time 6   Period Weeks   OT SHORT TERM GOAL #5   Title Pt will increase strength to 3-/5 to increase ability to increase ability to assist in performing daily tasks.    Time 6   Period Weeks           OT Long Term Goals - 06/02/15 1022    OT LONG TERM GOAL #1   Title Pt will return to highest level of functioning and independence in all daily and leisure tasks.    Time 12   Period Weeks   Status On-going   OT LONG TERM GOAL #2   Title Pt will decrease pain to 1/10 or less during daily tasks.    Time 12   Period Weeks   OT LONG TERM GOAL #3   Title Pt will decrease fascial restrictions from min to trace amounts or less.    Time 12   Period Weeks   Status On-going   OT LONG TERM GOAL #4   Title Pt will increase AROM to WNL to increase ability to reach into overhead cabinets.    Time 12   Period Weeks   Status On-going   OT LONG TERM GOAL #5   Title Pt will increase strength to 4+/5 to improve ability to care for mother.    Time 12   Period Weeks   Status Partially Met               Plan - 06/06/15 0926    Clinical Impression Statement A: Resumed missed exercsies. Completed Cybex row/press towards beginning of session.    Plan P: MFR PRN. Cont to complete Cybex row/press earlier in session. Increase weight as patient is able to tolerate.        Problem List Patient Active Problem List   Diagnosis Date Noted  . HTN (hypertension) 07/30/2013  . Dyslipidemia 07/30/2013  . Chest pain- low risk Myoview 8/13 07/30/2013    Ailene Ravel, OTR/L,CBIS  [phone]  06/06/2015, 10:36 AM  [place], [place], [address] Phone: [phone]   Fax:  [phone]

## 2015-06-07 ENCOUNTER — Ambulatory Visit (HOSPITAL_COMMUNITY): Payer: Medicare Other

## 2015-06-07 ENCOUNTER — Encounter (HOSPITAL_COMMUNITY): Payer: Self-pay

## 2015-06-07 DIAGNOSIS — M6281 Muscle weakness (generalized): Secondary | ICD-10-CM | POA: Diagnosis not present

## 2015-06-07 DIAGNOSIS — M6289 Other specified disorders of muscle: Secondary | ICD-10-CM

## 2015-06-07 DIAGNOSIS — M7582 Other shoulder lesions, left shoulder: Secondary | ICD-10-CM | POA: Diagnosis not present

## 2015-06-07 DIAGNOSIS — M25612 Stiffness of left shoulder, not elsewhere classified: Secondary | ICD-10-CM | POA: Diagnosis not present

## 2015-06-07 DIAGNOSIS — M25512 Pain in left shoulder: Secondary | ICD-10-CM

## 2015-06-07 DIAGNOSIS — M629 Disorder of muscle, unspecified: Secondary | ICD-10-CM

## 2015-06-07 DIAGNOSIS — R531 Weakness: Secondary | ICD-10-CM

## 2015-06-07 NOTE — Therapy (Signed)
[place], [place], [address] Phone: [phone]   Fax:  [phone]  Occupational Therapy Treatment  [name] MRN: [id number] Date of Birth: 1941-07-15 Referring Provider:  Elsie Saas, MD  Encounter Date: 06/07/2015      OT End of Session - 06/07/15 1136    Visit Number 34   Number of Visits 42   Date for OT Re-Evaluation 07/25/15  mini reassessment on 06/24/15   Authorization Type Medicare A & B   Authorization Time Period before 40th visit and progress note   Authorization - Visit Number 77   Authorization - Number of Visits 40   OT Start Time 0800   OT Stop Time 0845   OT Time Calculation (min) 45 min   Activity Tolerance Patient tolerated treatment well   Behavior During Therapy [facility] for tasks assessed/performed      Past Medical History  Diagnosis Date  . HTN (hypertension)   . Dyslipidemia   . Rheumatic fever     as a child  . Hypothyroidism   . History of cardiac monitoring     Past Surgical History  Procedure Laterality Date  . Rotator cuff repair  1991  . Tubal ligation  1973  . Colonoscopy N/A 02/02/2015    Procedure: COLONOSCOPY;  Surgeon: Rogene Houston, MD;  [facility];  Service: Endoscopy;  Laterality: N/A;  930    There were no vitals filed for this visit.  Visit Diagnosis:  Decreased strength  Stiffness of shoulder joint, left  Pain in left shoulder  Tight fascia      Subjective Assessment - 06/07/15 0817    Subjective  S: I don't hurt right now but when I'm moving and using my arm the pain is 6/10.   Currently in Pain? No/denies            [facility] OT Assessment - 06/07/15 0818    Assessment   Diagnosis s/p left RCR   Precautions   Precautions None   Type of Shoulder Precautions Increase weight each week. 1lb as tolerated.                  OT Treatments/Exercises (OP) - 06/07/15 0818    Exercises    Exercises Shoulder   Shoulder Exercises: Supine   Protraction PROM;5 reps;Strengthening;15 reps   Protraction Weight (lbs) 1   Horizontal ABduction PROM;5 reps;Strengthening;15 reps   Horizontal ABduction Weight (lbs) 1   External Rotation PROM;5 reps;Strengthening;15 reps   External Rotation Weight (lbs) 1   Internal Rotation PROM;5 reps;Strengthening;15 reps   Internal Rotation Weight (lbs) 1   Flexion PROM;5 reps;Strengthening;15 reps   Shoulder Flexion Weight (lbs) 1   ABduction PROM;5 reps;Strengthening;15 reps   Shoulder ABduction Weight (lbs) 1   Shoulder Exercises: Standing   Protraction Strengthening;15 reps   Protraction Weight (lbs) 1   Horizontal ABduction Strengthening;15 reps   Horizontal ABduction Weight (lbs) 1   External Rotation Strengthening;15 reps   External Rotation Weight (lbs) 1   Internal Rotation Strengthening;15 reps   Internal Rotation Weight (lbs) 1   Flexion Strengthening;15 reps   Shoulder Flexion Weight (lbs) 1   ABduction Strengthening;15 reps   Shoulder ABduction Weight (lbs) 1   Extension Theraband;12 reps   Theraband Level (Shoulder Extension) Level 4 (Blue)   Row Theraband;12 reps   Theraband Level (Shoulder Row) Level 4 (Blue)   Retraction Theraband;12 reps   Theraband Level (  Shoulder Retraction) Level 4 (Blue)   Shoulder Exercises: ROM/Strengthening   Cybex Press 2 plate;15 reps   Cybex Row 2.5 plate;15 reps   "W" Arms 15X with 1#   X to V Arms 15X with 1#   Proximal Shoulder Strengthening, Seated 15X with 1#   Ball on Wall 1' flexion 1' abduction red ball   Manual Therapy   Manual Therapy Myofascial release   Myofascial Release Myofascial release to left upper arm, scapular, trapezius, sternocleidomastoid, and shoulder region to decrease pain and restrictions and improve pain free mobility in her left shoulder                  OT Short Term Goals - 05/26/15 4010    OT SHORT TERM GOAL #1   Title Pt will be educated on  HEP.    Time 6   Period Weeks   OT SHORT TERM GOAL #2   Title Pt will decrease pain to 4/10 during daily activities.    Time 6   Period Weeks   OT SHORT TERM GOAL #3   Title Pt will decrease fascial restrictions from mod to min amounts.    Time 6   Period Weeks   OT SHORT TERM GOAL #4   Title Pt will increase PROM to [facility] to increase ability to participate in donning shirts.    Time 6   Period Weeks   OT SHORT TERM GOAL #5   Title Pt will increase strength to 3-/5 to increase ability to increase ability to assist in performing daily tasks.    Time 6   Period Weeks           OT Long Term Goals - 06/02/15 1022    OT LONG TERM GOAL #1   Title Pt will return to highest level of functioning and independence in all daily and leisure tasks.    Time 12   Period Weeks   Status On-going   OT LONG TERM GOAL #2   Title Pt will decrease pain to 1/10 or less during daily tasks.    Time 12   Period Weeks   OT LONG TERM GOAL #3   Title Pt will decrease fascial restrictions from min to trace amounts or less.    Time 12   Period Weeks   Status On-going   OT LONG TERM GOAL #4   Title Pt will increase AROM to WNL to increase ability to reach into overhead cabinets.    Time 12   Period Weeks   Status On-going   OT LONG TERM GOAL #5   Title Pt will increase strength to 4+/5 to improve ability to care for mother.    Time 12   Period Weeks   Status Partially Met               Plan - 06/07/15 1137    Clinical Impression Statement A: Pt reports increased soreness yesterday evening. Pt states that this morning she was unable to punch to the ceiling when she was laying in bed which scared her. We decreased the weight to increased comfort. Obstained from completing hughston exercises as those may have been the exercises that caused the increased soreness.    Plan P: MFR PRN. Cont to complete Cybex row/press eariler in session. Increase weight back to 2# if able to tolerate.          Problem List Patient Active Problem List   Diagnosis Date Noted  . HTN (hypertension) 07/30/2013  .  Dyslipidemia 07/30/2013  . Chest pain- low risk Myoview 8/13 07/30/2013    Ailene Ravel, OTR/L,CBIS  [phone]  06/07/2015, 11:44 AM  [place], [place], [address] Phone: [phone]   Fax:  [phone]

## 2015-06-09 ENCOUNTER — Encounter (HOSPITAL_COMMUNITY): Payer: Self-pay

## 2015-06-09 ENCOUNTER — Ambulatory Visit (HOSPITAL_COMMUNITY): Payer: Medicare Other

## 2015-06-09 DIAGNOSIS — M629 Disorder of muscle, unspecified: Secondary | ICD-10-CM

## 2015-06-09 DIAGNOSIS — M6289 Other specified disorders of muscle: Secondary | ICD-10-CM

## 2015-06-09 DIAGNOSIS — M6281 Muscle weakness (generalized): Secondary | ICD-10-CM | POA: Diagnosis not present

## 2015-06-09 DIAGNOSIS — M25612 Stiffness of left shoulder, not elsewhere classified: Secondary | ICD-10-CM | POA: Diagnosis not present

## 2015-06-09 DIAGNOSIS — M7582 Other shoulder lesions, left shoulder: Secondary | ICD-10-CM | POA: Diagnosis not present

## 2015-06-09 DIAGNOSIS — M25512 Pain in left shoulder: Secondary | ICD-10-CM | POA: Diagnosis not present

## 2015-06-09 DIAGNOSIS — R531 Weakness: Secondary | ICD-10-CM

## 2015-06-09 NOTE — Therapy (Signed)
[place], [place], [address] Phone: [phone]   Fax:  [phone]  Occupational Therapy Treatment  [name] MRN: [id number] Date of Birth: July 10, 1941 Referring Provider:  Elsie Saas, MD  Encounter Date: 06/09/2015      OT End of Session - 06/09/15 0839    Visit Number 35   Number of Visits 42   Date for OT Re-Evaluation 07/25/15  mini reassessment on 06/24/15   Authorization Type Medicare A & B   Authorization Time Period before 40th visit and progress note   Authorization - Visit Number 16   Authorization - Number of Visits 40   OT Start Time 0800   OT Stop Time 0845   OT Time Calculation (min) 45 min   Activity Tolerance Patient tolerated treatment well   Behavior During Therapy [facility] for tasks assessed/performed      Past Medical History  Diagnosis Date  . HTN (hypertension)   . Dyslipidemia   . Rheumatic fever     as a child  . Hypothyroidism   . History of cardiac monitoring     Past Surgical History  Procedure Laterality Date  . Rotator cuff repair  1991  . Tubal ligation  1973  . Colonoscopy N/A 02/02/2015    Procedure: COLONOSCOPY;  Surgeon: Rogene Houston, MD;  [facility];  Service: Endoscopy;  Laterality: N/A;  930    There were no vitals filed for this visit.  Visit Diagnosis:  Stiffness of shoulder joint, left  Decreased strength  Tight fascia      Subjective Assessment - 06/09/15 0817    Subjective  S: Today my arm feels fine. It was just that one day that it hurt. It's a little tender in one area.   Currently in Pain? No/denies            [facility] OT Assessment - 06/09/15 0818    Assessment   Diagnosis s/p left RCR   Precautions   Precautions None   Type of Shoulder Precautions Increase weight each week. 1lb as tolerated.                  OT Treatments/Exercises (OP) - 06/09/15 0818    Exercises    Exercises Shoulder   Shoulder Exercises: Supine   Protraction PROM;5 reps;Strengthening;12 reps   Protraction Weight (lbs) 2   Horizontal ABduction PROM;5 reps;Strengthening;12 reps   Horizontal ABduction Weight (lbs) 2   External Rotation PROM;5 reps;Strengthening;12 reps   External Rotation Weight (lbs) 2   Internal Rotation PROM;5 reps;Strengthening;12 reps   Internal Rotation Weight (lbs) 2   Flexion PROM;5 reps;Strengthening;12 reps   Shoulder Flexion Weight (lbs) 2   ABduction PROM;5 reps;Strengthening;12 reps   Shoulder ABduction Weight (lbs) 2   Shoulder Exercises: Standing   Protraction Strengthening;12 reps   Protraction Weight (lbs) 2   Horizontal ABduction Strengthening;12 reps   Horizontal ABduction Weight (lbs) 2   External Rotation Strengthening;12 reps   External Rotation Weight (lbs) 2   Internal Rotation Strengthening;12 reps   Internal Rotation Weight (lbs) 2   Flexion Strengthening;12 reps   Shoulder Flexion Weight (lbs) 2   ABduction Strengthening;12 reps   Shoulder ABduction Weight (lbs) 2   Extension Theraband;15 reps   Theraband Level (Shoulder Extension) Level 4 (Blue)   Row Theraband;15 reps   Theraband Level (Shoulder Row) Level 4 (Blue)   Retraction Theraband;15 reps   Theraband Level (Shoulder  Retraction) Level 4 (Blue)   Shoulder Exercises: ROM/Strengthening   UBE (Upper Arm Bike) Level 3 2' reverse 2' forward    Cybex Press 2 plate;15 reps   Cybex Row 2.5 plate;15 reps   "W" Arms 12X with 2#   X to V Arms 12X with 2#   Proximal Shoulder Strengthening, Supine 12X with 2#   Proximal Shoulder Strengthening, Seated 12X with 2#   Ball on Wall 1' flexion 1' abduction red ball   Manual Therapy   Manual Therapy Myofascial release   Myofascial Release Myofascial release to left upper arm, scapular, trapezius, sternocleidomastoid, and shoulder region to decrease pain and restrictions and improve pain free mobility in her left shoulder                   OT Short Term Goals - 05/26/15 2952    OT SHORT TERM GOAL #1   Title Pt will be educated on HEP.    Time 6   Period Weeks   OT SHORT TERM GOAL #2   Title Pt will decrease pain to 4/10 during daily activities.    Time 6   Period Weeks   OT SHORT TERM GOAL #3   Title Pt will decrease fascial restrictions from mod to min amounts.    Time 6   Period Weeks   OT SHORT TERM GOAL #4   Title Pt will increase PROM to [facility] to increase ability to participate in donning shirts.    Time 6   Period Weeks   OT SHORT TERM GOAL #5   Title Pt will increase strength to 3-/5 to increase ability to increase ability to assist in performing daily tasks.    Time 6   Period Weeks           OT Long Term Goals - 06/02/15 1022    OT LONG TERM GOAL #1   Title Pt will return to highest level of functioning and independence in all daily and leisure tasks.    Time 12   Period Weeks   Status On-going   OT LONG TERM GOAL #2   Title Pt will decrease pain to 1/10 or less during daily tasks.    Time 12   Period Weeks   OT LONG TERM GOAL #3   Title Pt will decrease fascial restrictions from min to trace amounts or less.    Time 12   Period Weeks   Status On-going   OT LONG TERM GOAL #4   Title Pt will increase AROM to WNL to increase ability to reach into overhead cabinets.    Time 12   Period Weeks   Status On-going   OT LONG TERM GOAL #5   Title Pt will increase strength to 4+/5 to improve ability to care for mother.    Time 12   Period Weeks   Status Partially Met               Plan - 06/09/15 0839    Clinical Impression Statement A: Resumed 2# hand weights. Increased to level 3 on UBE bike. Patient tolerated well.    Plan P: Resume hughston exercises with no weight or with 1#. Update HEP.         Problem List Patient Active Problem List   Diagnosis Date Noted  . HTN (hypertension) 07/30/2013  . Dyslipidemia 07/30/2013  . Chest pain- low risk Myoview  8/13 07/30/2013    Ailene Ravel, OTR/L,CBIS  [phone]  06/09/2015, 8:41 AM  [place], [place], [address] Phone: [phone]   Fax:  [phone]

## 2015-06-12 ENCOUNTER — Ambulatory Visit (HOSPITAL_COMMUNITY): Payer: Medicare Other | Admitting: Specialist

## 2015-06-12 DIAGNOSIS — M25512 Pain in left shoulder: Secondary | ICD-10-CM | POA: Diagnosis not present

## 2015-06-12 DIAGNOSIS — M629 Disorder of muscle, unspecified: Secondary | ICD-10-CM | POA: Diagnosis not present

## 2015-06-12 DIAGNOSIS — R531 Weakness: Secondary | ICD-10-CM

## 2015-06-12 DIAGNOSIS — M7582 Other shoulder lesions, left shoulder: Secondary | ICD-10-CM | POA: Diagnosis not present

## 2015-06-12 DIAGNOSIS — M6281 Muscle weakness (generalized): Secondary | ICD-10-CM | POA: Diagnosis not present

## 2015-06-12 DIAGNOSIS — M25612 Stiffness of left shoulder, not elsewhere classified: Secondary | ICD-10-CM

## 2015-06-12 NOTE — Therapy (Signed)
[place], [place], [address] Phone: [phone]   Fax:  [phone]  Occupational Therapy Treatment  [name] MRN: [id number] Date of Birth: [birth year] Referring Provider:  Elsie Saas, MD  Encounter Date: 06/12/2015      OT End of Session - 06/12/15 1230    Visit Number 36   Number of Visits 42   Date for OT Re-Evaluation 07/25/15  mini reassess on 9/3   Authorization Type Medicare A & B   Authorization Time Period before 40th visit and progress note   Authorization - Visit Number 57   Authorization - Number of Visits 40   OT Start Time 1151   OT Stop Time 1231   OT Time Calculation (min) 40 min   Activity Tolerance Patient tolerated treatment well   Behavior During Therapy [facility] for tasks assessed/performed      Past Medical History  Diagnosis Date  . HTN (hypertension)   . Dyslipidemia   . Rheumatic fever     as a child  . Hypothyroidism   . History of cardiac monitoring     Past Surgical History  Procedure Laterality Date  . Rotator cuff repair  1991  . Tubal ligation  1973  . Colonoscopy N/A 02/02/2015    Procedure: COLONOSCOPY;  Surgeon: Rogene Houston, MD;  [facility];  Service: Endoscopy;  Laterality: N/A;  930    There were no vitals filed for this visit.  Visit Diagnosis:  Stiffness of shoulder joint, left  Decreased strength  Pain in left shoulder      Subjective Assessment - 06/12/15 1152    Subjective  S:  I think the exercises on my stomach are what made me sore.            [facility] OT Assessment - 06/12/15 0001    Assessment   Diagnosis s/p left RCR   Precautions   Precautions None   Type of Shoulder Precautions Increase weight each week. 1lb as tolerated.                  OT Treatments/Exercises (OP) - 06/12/15 0001    Exercises   Exercises Shoulder   Shoulder Exercises: Supine   Protraction PROM;5  reps;Strengthening;12 reps   Protraction Weight (lbs) 2   Horizontal ABduction PROM;5 reps;Strengthening;12 reps   Horizontal ABduction Weight (lbs) 2   External Rotation PROM;5 reps;Strengthening;12 reps   External Rotation Weight (lbs) 2   Internal Rotation PROM;5 reps;Strengthening;12 reps   Internal Rotation Weight (lbs) 2   Flexion PROM;5 reps;Strengthening;12 reps   Shoulder Flexion Weight (lbs) 2   ABduction PROM;5 reps;Strengthening;12 reps   Shoulder ABduction Weight (lbs) 2   Shoulder Exercises: Prone   Other Prone Exercises Hughston exercises; 12X   Shoulder Exercises: Sidelying   External Rotation AROM;10 reps   Internal Rotation AROM;10 reps   Shoulder Exercises: ROM/Strengthening   UBE (Upper Arm Bike) level 3 3' forward and 3' reverse   Cybex Press 2.5 plate;15 reps   Cybex Row 2.5 plate;15 reps   Wall Pushups 10 reps  wide at bottom walk up to narrow and back   "W" Arms 15 times with 2#   X to V Arms 15 times with 2#   Proximal Shoulder Strengthening, Supine 12X with 2#   Other ROM/Strengthening Exercises overhead press with 2# 5 times max difficulty   Manual Therapy   Manual Therapy Myofascial release  Myofascial Release Myofascial release to left upper arm, scapular, trapezius, sternocleidomastoid, and shoulder region to decrease pain and restrictions and improve pain free mobility in her left shoulder                  OT Short Term Goals - 05/26/15 0958    OT SHORT TERM GOAL #1   Title Pt will be educated on HEP.    Time 6   Period Weeks   OT SHORT TERM GOAL #2   Title Pt will decrease pain to 4/10 during daily activities.    Time 6   Period Weeks   OT SHORT TERM GOAL #3   Title Pt will decrease fascial restrictions from mod to min amounts.    Time 6   Period Weeks   OT SHORT TERM GOAL #4   Title Pt will increase PROM to [facility] to increase ability to participate in donning shirts.    Time 6   Period Weeks   OT SHORT TERM GOAL #5   Title Pt  will increase strength to 3-/5 to increase ability to increase ability to assist in performing daily tasks.    Time 6   Period Weeks           OT Long Term Goals - 06/02/15 1022    OT LONG TERM GOAL #1   Title Pt will return to highest level of functioning and independence in all daily and leisure tasks.    Time 12   Period Weeks   Status On-going   OT LONG TERM GOAL #2   Title Pt will decrease pain to 1/10 or less during daily tasks.    Time 12   Period Weeks   OT LONG TERM GOAL #3   Title Pt will decrease fascial restrictions from min to trace amounts or less.    Time 12   Period Weeks   Status On-going   OT LONG TERM GOAL #4   Title Pt will increase AROM to WNL to increase ability to reach into overhead cabinets.    Time 12   Period Weeks   Status On-going   OT LONG TERM GOAL #5   Title Pt will increase strength to 4+/5 to improve ability to care for mother.    Time 12   Period Weeks   Status Partially Met               Plan - 06/12/15 1230    Clinical Impression Statement A: completed sidelying external rotation without weight with good effort, added overhead press which was quite difficult for patient    Plan P:  Update HEP, attempt .75# with prone and sidelying exercises, attempt table pushups        Problem List Patient Active Problem List   Diagnosis Date Noted  . HTN (hypertension) 07/30/2013  . Dyslipidemia 07/30/2013  . Chest pain- low risk Myoview 8/13 07/30/2013    Vangie Bicker, OTR/L [phone]  06/12/2015, 12:33 PM  [place] [address] [place], [place], [address] Phone: [phone]   Fax:  [phone]

## 2015-06-14 ENCOUNTER — Ambulatory Visit (HOSPITAL_COMMUNITY): Payer: Medicare Other

## 2015-06-14 ENCOUNTER — Encounter (HOSPITAL_COMMUNITY): Payer: Self-pay

## 2015-06-14 DIAGNOSIS — R531 Weakness: Secondary | ICD-10-CM

## 2015-06-14 DIAGNOSIS — M25612 Stiffness of left shoulder, not elsewhere classified: Secondary | ICD-10-CM | POA: Diagnosis not present

## 2015-06-14 DIAGNOSIS — M25512 Pain in left shoulder: Secondary | ICD-10-CM | POA: Diagnosis not present

## 2015-06-14 DIAGNOSIS — M6281 Muscle weakness (generalized): Secondary | ICD-10-CM | POA: Diagnosis not present

## 2015-06-14 DIAGNOSIS — M629 Disorder of muscle, unspecified: Secondary | ICD-10-CM | POA: Diagnosis not present

## 2015-06-14 DIAGNOSIS — M7582 Other shoulder lesions, left shoulder: Secondary | ICD-10-CM | POA: Diagnosis not present

## 2015-06-14 NOTE — Therapy (Signed)
[place], [place], [address] Phone: [phone]   Fax:  [phone]  Occupational Therapy Treatment  [name] MRN: [id number] Date of Birth: 03-25-1941 Referring Provider:  Elsie Saas, MD  Encounter Date: 06/14/2015      OT End of Session - 06/14/15 0847    Visit Number 37   Number of Visits 42   Date for OT Re-Evaluation 07/25/15  mini reassess on 9/3   Authorization Type Medicare A & B   Authorization Time Period before 40th visit and progress note   Authorization - Visit Number 49   Authorization - Number of Visits 40   OT Start Time 0800   OT Stop Time 0845   OT Time Calculation (min) 45 min   Activity Tolerance Patient tolerated treatment well   Behavior During Therapy [facility] for tasks assessed/performed      Past Medical History  Diagnosis Date  . HTN (hypertension)   . Dyslipidemia   . Rheumatic fever     as a child  . Hypothyroidism   . History of cardiac monitoring     Past Surgical History  Procedure Laterality Date  . Rotator cuff repair  1991  . Tubal ligation  1973  . Colonoscopy N/A 02/02/2015    Procedure: COLONOSCOPY;  Surgeon: Rogene Houston, MD;  [facility];  Service: Endoscopy;  Laterality: N/A;  930    There were no vitals filed for this visit.  Visit Diagnosis:  Stiffness of shoulder joint, left  Decreased strength          [facility] OT Assessment - 06/14/15 0814    Assessment   Diagnosis s/p left RCR   Precautions   Precautions None   Type of Shoulder Precautions Increase weight each week. 1lb as tolerated.                  OT Treatments/Exercises (OP) - 06/14/15 0814    Exercises   Exercises Shoulder   Shoulder Exercises: Supine   Protraction PROM;5 reps;Strengthening;10 reps   Protraction Weight (lbs) 3   Horizontal ABduction PROM;5 reps;Strengthening;10 reps   Horizontal ABduction Weight (lbs) 3   External Rotation PROM;5 reps;Strengthening;10 reps   External Rotation Weight (lbs) 3   Internal Rotation PROM;5 reps;Strengthening;10 reps   Internal Rotation Weight (lbs) 3   Flexion PROM;5 reps;Strengthening;10 reps   Shoulder Flexion Weight (lbs) 3   ABduction PROM;5 reps;Strengthening;10 reps   Shoulder ABduction Weight (lbs) 3   Shoulder Exercises: Seated   Horizontal ABduction Theraband;10 reps   Theraband Level (Shoulder Horizontal ABduction) Level 2 (Red)   External Rotation Theraband;10 reps   Theraband Level (Shoulder External Rotation) Level 2 (Red)   Internal Rotation Theraband;10 reps   Theraband Level (Shoulder Internal Rotation) Level 2 (Red)   Abduction Theraband;10 reps   Theraband Level (Shoulder ABduction) Level 2 (Red)   Other Seated Exercises PNF strengthening; red theraband; 10X   Shoulder Exercises: Prone   Other Prone Exercises [place] exercises; 12X with .75#   Shoulder Exercises: Sidelying   External Rotation Strengthening;12 reps   External Rotation Weight (lbs) 2   Internal Rotation Strengthening;12 reps   Internal Rotation Weight (lbs) 2   ABduction Strengthening;12 reps   ABduction Weight (lbs) 2   Shoulder Exercises: ROM/Strengthening   Cybex Press 3 plate  00T   Cybex Row 3 plate;15 reps   Wall Pushups 10 reps  Incline on countertop  Proximal Shoulder Strengthening, Supine 12X with 3#   Other ROM/Strengthening Exercises overhead press with 2# 10 times mod difficulty                OT Education - 06/14/15 0832    Education provided Yes   Education Details Red theraband strengthening exercises   Person(s) Educated Patient   Methods Explanation;Demonstration;Handout   Comprehension Verbalized understanding;Returned demonstration          OT Short Term Goals - 05/26/15 0958    OT SHORT TERM GOAL #1   Title Pt will be educated on HEP.    Time 6   Period Weeks   OT SHORT TERM GOAL #2   Title Pt will decrease pain to 4/10  during daily activities.    Time 6   Period Weeks   OT SHORT TERM GOAL #3   Title Pt will decrease fascial restrictions from mod to min amounts.    Time 6   Period Weeks   OT SHORT TERM GOAL #4   Title Pt will increase PROM to [facility] to increase ability to participate in donning shirts.    Time 6   Period Weeks   OT SHORT TERM GOAL #5   Title Pt will increase strength to 3-/5 to increase ability to increase ability to assist in performing daily tasks.    Time 6   Period Weeks           OT Long Term Goals - 06/02/15 1022    OT LONG TERM GOAL #1   Title Pt will return to highest level of functioning and independence in all daily and leisure tasks.    Time 12   Period Weeks   Status On-going   OT LONG TERM GOAL #2   Title Pt will decrease pain to 1/10 or less during daily tasks.    Time 12   Period Weeks   OT LONG TERM GOAL #3   Title Pt will decrease fascial restrictions from min to trace amounts or less.    Time 12   Period Weeks   Status On-going   OT LONG TERM GOAL #4   Title Pt will increase AROM to WNL to increase ability to reach into overhead cabinets.    Time 12   Period Weeks   Status On-going   OT LONG TERM GOAL #5   Title Pt will increase strength to 4+/5 to improve ability to care for mother.    Time 12   Period Weeks   Status Partially Met               Plan - 06/14/15 0847    Clinical Impression Statement A: Added .75# wrist weight to prone exercises. Added 2# weight to sidelying exercises. Increased to 3# for prone exercises. Update HEP with theraband strengthening exercises. Pt reports no pain during exercises and did say they were challenging.    Plan P: Follow up on HEP. Cont to progress strengthening exercises as patient is able to tolerate.        Problem List Patient Active Problem List   Diagnosis Date Noted  . HTN (hypertension) 07/30/2013  . Dyslipidemia 07/30/2013  . Chest pain- low risk Myoview 8/13 07/30/2013    Ailene Ravel, OTR/L,CBIS  [phone]  06/14/2015, 8:49 AM  [place], [place], [address] Phone: [phone]   Fax:  [phone]

## 2015-06-14 NOTE — Patient Instructions (Signed)
Strengthening: Chest Pull - Resisted   Hold Theraband in front of body with hands about shoulder width a part. Pull band a part and back together slowly. Repeat __12__ times. Complete __1__ set(s) per session.. Repeat __2__ session(s) per day.  http://orth.exer.us/926   Copyright  VHI. All rights reserved.   PNF Strengthening: Resisted   Standing with resistive band around each hand, bring right arm up and away, thumb back. Repeat _12___ times per set. Do ___1_ sets per session. Do __2__ sessions per day.  http://orth.exer.us/918   Copyright  VHI. All rights reserved.   PNF Strengthening: Resisted   Standing with resistive band around each hand, bring right arm up and across body. Repeat __12__ times per set. Do _1___ sets per session. Do __2__ sessions per day.  http://orth.exer.us/920   Copyright  VHI. All rights reserved.    Resisted External Rotation: in Neutral - Bilateral   Sit or stand, tubing in both hands, elbows at sides, bent to 90, forearms forward. Pinch shoulder blades together and rotate forearms out. Keep elbows at sides. Repeat _12___ times per set. Do __1__ sets per session. Do __2__ sessions per day.  http://orth.exer.us/966   Copyright  VHI. All rights reserved.   PNF Strengthening: Resisted   Standing, hold resistive band above head. Bring right arm down and out from side. Repeat _12___ times per set. Do _1___ sets per session. Do _2___ sessions per day.  http://orth.exer.us/922   Copyright  VHI. All rights reserved.

## 2015-06-16 ENCOUNTER — Encounter (HOSPITAL_COMMUNITY): Payer: Self-pay

## 2015-06-16 ENCOUNTER — Ambulatory Visit (HOSPITAL_COMMUNITY): Payer: Medicare Other

## 2015-06-16 DIAGNOSIS — R531 Weakness: Secondary | ICD-10-CM

## 2015-06-16 DIAGNOSIS — M25612 Stiffness of left shoulder, not elsewhere classified: Secondary | ICD-10-CM | POA: Diagnosis not present

## 2015-06-16 DIAGNOSIS — M25512 Pain in left shoulder: Secondary | ICD-10-CM | POA: Diagnosis not present

## 2015-06-16 DIAGNOSIS — M7582 Other shoulder lesions, left shoulder: Secondary | ICD-10-CM | POA: Diagnosis not present

## 2015-06-16 DIAGNOSIS — M629 Disorder of muscle, unspecified: Secondary | ICD-10-CM | POA: Diagnosis not present

## 2015-06-16 DIAGNOSIS — M6281 Muscle weakness (generalized): Secondary | ICD-10-CM | POA: Diagnosis not present

## 2015-06-16 NOTE — Therapy (Signed)
[place], [place], [address] Phone: [phone]   Fax:  [phone]  Occupational Therapy Treatment  [name] MRN: [id number] Date of Birth: 1941-01-07 Referring Provider:  Elsie Saas, MD  Encounter Date: 06/16/2015      OT End of Session - 06/16/15 1018    Visit Number 38   Number of Visits 42   Date for OT Re-Evaluation 07/25/15  mini reassess on 9/3   Authorization Type Medicare A & B   Authorization Time Period before 40th visit and progress note   Authorization - Visit Number 13   Authorization - Number of Visits 40   OT Start Time 0932   OT Stop Time 1015   OT Time Calculation (min) 43 min   Activity Tolerance Patient tolerated treatment well   Behavior During Therapy [facility] for tasks assessed/performed      Past Medical History  Diagnosis Date  . HTN (hypertension)   . Dyslipidemia   . Rheumatic fever     as a child  . Hypothyroidism   . History of cardiac monitoring     Past Surgical History  Procedure Laterality Date  . Rotator cuff repair  1991  . Tubal ligation  1973  . Colonoscopy N/A 02/02/2015    Procedure: COLONOSCOPY;  Surgeon: Rogene Houston, MD;  [facility];  Service: Endoscopy;  Laterality: N/A;  930    There were no vitals filed for this visit.  Visit Diagnosis:  Stiffness of shoulder joint, left  Decreased strength      Subjective Assessment - 06/16/15 0945    Subjective  S: I was pulling up [place] the other day. I was using my right arm for most of it but the left was a little achy after.   Currently in Pain? No/denies            [facility] OT Assessment - 06/16/15 0946    Assessment   Diagnosis s/p left RCR   Precautions   Precautions None   Type of Shoulder Precautions Increase weight each week. 1lb as tolerated.                  OT Treatments/Exercises (OP) - 06/16/15 0946    Exercises   Exercises  Shoulder   Shoulder Exercises: Supine   Protraction PROM;5 reps;Strengthening;10 reps   Protraction Weight (lbs) 3   Horizontal ABduction PROM;5 reps;Strengthening;10 reps   Horizontal ABduction Weight (lbs) 3   External Rotation PROM;5 reps;Strengthening;10 reps   External Rotation Weight (lbs) 3   Internal Rotation PROM;5 reps;Strengthening;10 reps   Internal Rotation Weight (lbs) 3   Flexion PROM;5 reps;Strengthening;10 reps   Shoulder Flexion Weight (lbs) 3   ABduction PROM;5 reps;Strengthening;10 reps   Shoulder ABduction Weight (lbs) 3   Shoulder Exercises: Prone   Other Prone Exercises [place] exercises; 12X with .75#   Shoulder Exercises: Sidelying   External Rotation Strengthening;12 reps   External Rotation Weight (lbs) 2   Internal Rotation Strengthening;12 reps   Internal Rotation Weight (lbs) 2   Flexion Strengthening;10 reps   Flexion Weight (lbs) 2   ABduction Strengthening;12 reps   ABduction Weight (lbs) 2   Shoulder Exercises: Standing   Protraction Strengthening;12 reps   Protraction Weight (lbs) 2   Horizontal ABduction Strengthening;12 reps   Horizontal ABduction Weight (lbs) 2   External Rotation Strengthening;12 reps   External Rotation Weight (lbs) 2   Internal Rotation Strengthening;12  reps   Internal Rotation Weight (lbs) 2   Flexion Strengthening;12 reps   Shoulder Flexion Weight (lbs) 2   ABduction Strengthening;12 reps   Shoulder ABduction Weight (lbs) 2   Shoulder Exercises: ROM/Strengthening   Cybex Press 3 plate;15 reps   Cybex Row 3 plate;15 reps   Wall Pushups --   Pushups 15 reps  Modified countertop   X to V Arms 15 times with 2#   Proximal Shoulder Strengthening, Supine 12X with 3#   Proximal Shoulder Strengthening, Seated 12X with 2# with rest breaks   Other ROM/Strengthening Exercises overhead press with 2# 10 times mod difficulty   Manual Therapy   Manual Therapy Muscle Energy Technique   Muscle Energy Technique Muscle energy  techique to left anterior and medial deltoid to relax tone and muscle spasm and improve range of motion.                   OT Short Term Goals - 05/26/15 0958    OT SHORT TERM GOAL #1   Title Pt will be educated on HEP.    Time 6   Period Weeks   OT SHORT TERM GOAL #2   Title Pt will decrease pain to 4/10 during daily activities.    Time 6   Period Weeks   OT SHORT TERM GOAL #3   Title Pt will decrease fascial restrictions from mod to min amounts.    Time 6   Period Weeks   OT SHORT TERM GOAL #4   Title Pt will increase PROM to [facility] to increase ability to participate in donning shirts.    Time 6   Period Weeks   OT SHORT TERM GOAL #5   Title Pt will increase strength to 3-/5 to increase ability to increase ability to assist in performing daily tasks.    Time 6   Period Weeks           OT Long Term Goals - 06/02/15 1022    OT LONG TERM GOAL #1   Title Pt will return to highest level of functioning and independence in all daily and leisure tasks.    Time 12   Period Weeks   Status On-going   OT LONG TERM GOAL #2   Title Pt will decrease pain to 1/10 or less during daily tasks.    Time 12   Period Weeks   OT LONG TERM GOAL #3   Title Pt will decrease fascial restrictions from min to trace amounts or less.    Time 12   Period Weeks   Status On-going   OT LONG TERM GOAL #4   Title Pt will increase AROM to WNL to increase ability to reach into overhead cabinets.    Time 12   Period Weeks   Status On-going   OT LONG TERM GOAL #5   Title Pt will increase strength to 4+/5 to improve ability to care for mother.    Time 12   Period Weeks   Status Partially Met               Plan - 06/16/15 1048    Clinical Impression Statement A: Pt had a question regarding form for PNF strengthening exercise with theraband. Reviewed exercise and all questions were answered.    Plan P: Cont to progress strengthening as patient is able to tolerate. 1# weight prone and  3# weight sidelying.         Problem List Patient Active Problem List  Diagnosis Date Noted  . HTN (hypertension) 07/30/2013  . Dyslipidemia 07/30/2013  . Chest pain- low risk Myoview 8/13 07/30/2013    Ailene Ravel, OTR/L,CBIS  [phone]  06/16/2015, 11:33 AM  [place], [place], [address] Phone: [phone]   Fax:  [phone]

## 2015-06-19 ENCOUNTER — Ambulatory Visit (HOSPITAL_COMMUNITY): Payer: Medicare Other | Admitting: Occupational Therapy

## 2015-06-19 ENCOUNTER — Encounter (HOSPITAL_COMMUNITY): Payer: Self-pay | Admitting: Occupational Therapy

## 2015-06-19 DIAGNOSIS — M6281 Muscle weakness (generalized): Secondary | ICD-10-CM | POA: Diagnosis not present

## 2015-06-19 DIAGNOSIS — M6289 Other specified disorders of muscle: Secondary | ICD-10-CM

## 2015-06-19 DIAGNOSIS — M25512 Pain in left shoulder: Secondary | ICD-10-CM

## 2015-06-19 DIAGNOSIS — M25612 Stiffness of left shoulder, not elsewhere classified: Secondary | ICD-10-CM | POA: Diagnosis not present

## 2015-06-19 DIAGNOSIS — M629 Disorder of muscle, unspecified: Secondary | ICD-10-CM | POA: Diagnosis not present

## 2015-06-19 DIAGNOSIS — M7582 Other shoulder lesions, left shoulder: Secondary | ICD-10-CM | POA: Diagnosis not present

## 2015-06-19 DIAGNOSIS — R531 Weakness: Secondary | ICD-10-CM

## 2015-06-19 NOTE — Therapy (Signed)
[place], [place], [address] Phone: [phone]   Fax:  [phone]  Occupational Therapy Treatment  [name] MRN: [id number] Date of Birth: 25-May-1941 Referring Provider:  Elsie Saas, MD  Encounter Date: 06/19/2015      OT End of Session - 06/19/15 1153    Visit Number 39   Number of Visits 42   Date for OT Re-Evaluation 07/25/15  mini reassess on 9/3   Authorization Type Medicare A & B   Authorization Time Period before 40th visit and progress note   Authorization - Visit Number 38   Authorization - Number of Visits 40   OT Start Time 0932   OT Stop Time 1013   OT Time Calculation (min) 41 min   Activity Tolerance Patient tolerated treatment well   Behavior During Therapy [facility] for tasks assessed/performed      Past Medical History  Diagnosis Date  . HTN (hypertension)   . Dyslipidemia   . Rheumatic fever     as a child  . Hypothyroidism   . History of cardiac monitoring     Past Surgical History  Procedure Laterality Date  . Rotator cuff repair  1991  . Tubal ligation  1973  . Colonoscopy N/A 02/02/2015    Procedure: COLONOSCOPY;  Surgeon: Rogene Houston, MD;  [facility];  Service: Endoscopy;  Laterality: N/A;  930    There were no vitals filed for this visit.  Visit Diagnosis:  Stiffness of shoulder joint, left  Decreased strength  Pain in left shoulder  Tight fascia  Decreased range of motion of shoulder, left      Subjective Assessment - 06/19/15 0930    Subjective  S: My shoulder is doing good, it's my back that's hurting.    Currently in Pain? No/denies            [facility] OT Assessment - 06/19/15 0930    Assessment   Diagnosis s/p left RCR   Precautions   Precautions None   Type of Shoulder Precautions Increase weight each week. 1lb as tolerated.                  OT Treatments/Exercises (OP) - 06/19/15 0934     Exercises   Exercises Shoulder   Shoulder Exercises: Supine   Protraction PROM;5 reps;Strengthening;10 reps   Protraction Weight (lbs) 3   Horizontal ABduction PROM;5 reps;Strengthening;10 reps   Horizontal ABduction Weight (lbs) 3   External Rotation PROM;5 reps;Strengthening;10 reps   External Rotation Weight (lbs) 3   Internal Rotation PROM;5 reps;Strengthening;10 reps   Internal Rotation Weight (lbs) 3   Flexion PROM;5 reps;Strengthening;10 reps   Shoulder Flexion Weight (lbs) 3   ABduction PROM;5 reps;Strengthening;10 reps   Shoulder ABduction Weight (lbs) 3   Shoulder Exercises: Prone   Other Prone Exercises [place] exercises; 10X with 1#   Shoulder Exercises: Sidelying   External Rotation Strengthening;10 reps   External Rotation Weight (lbs) 3   Internal Rotation Strengthening;10 reps   Internal Rotation Weight (lbs) 3   Flexion Strengthening;10 reps   Flexion Weight (lbs) 3   ABduction Strengthening;10 reps   ABduction Weight (lbs) 3   Shoulder Exercises: Standing   Protraction Strengthening;12 reps   Protraction Weight (lbs) 2   Horizontal ABduction Strengthening;12 reps   Horizontal ABduction Weight (lbs) 2   External Rotation Strengthening;12 reps   External Rotation Weight (lbs) 2   Internal Rotation  Strengthening;12 reps   Internal Rotation Weight (lbs) 2   Flexion Strengthening;12 reps   Shoulder Flexion Weight (lbs) 2   ABduction Strengthening;12 reps   Shoulder ABduction Weight (lbs) 2   Shoulder Exercises: ROM/Strengthening   UBE (Upper Arm Bike) level 3 3' forward and 3' reverse   Cybex Press 3 plate;15 reps   Cybex Row 3 plate;15 reps   Pushups 15 reps  modified countertop   X to V Arms 15 times with 2#   Proximal Shoulder Strengthening, Supine 12X with 3#   Proximal Shoulder Strengthening, Seated 12X with 2# with rest breaks   Other ROM/Strengthening Exercises overhead press with 2# 10 times mod difficulty   Shoulder Exercises: Stretch    Internal Rotation Stretch 3 reps  10 seconds; towel stretch   Manual Therapy   Manual Therapy Muscle Energy Technique   Muscle Energy Technique Muscle energy techique to left anterior and medial deltoid to relax tone and muscle spasm and improve range of motion.                   OT Short Term Goals - 05/26/15 0958    OT SHORT TERM GOAL #1   Title Pt will be educated on HEP.    Time 6   Period Weeks   OT SHORT TERM GOAL #2   Title Pt will decrease pain to 4/10 during daily activities.    Time 6   Period Weeks   OT SHORT TERM GOAL #3   Title Pt will decrease fascial restrictions from mod to min amounts.    Time 6   Period Weeks   OT SHORT TERM GOAL #4   Title Pt will increase PROM to [facility] to increase ability to participate in donning shirts.    Time 6   Period Weeks   OT SHORT TERM GOAL #5   Title Pt will increase strength to 3-/5 to increase ability to increase ability to assist in performing daily tasks.    Time 6   Period Weeks           OT Long Term Goals - 06/02/15 1022    OT LONG TERM GOAL #1   Title Pt will return to highest level of functioning and independence in all daily and leisure tasks.    Time 12   Period Weeks   Status On-going   OT LONG TERM GOAL #2   Title Pt will decrease pain to 1/10 or less during daily tasks.    Time 12   Period Weeks   OT LONG TERM GOAL #3   Title Pt will decrease fascial restrictions from min to trace amounts or less.    Time 12   Period Weeks   Status On-going   OT LONG TERM GOAL #4   Title Pt will increase AROM to WNL to increase ability to reach into overhead cabinets.    Time 12   Period Weeks   Status On-going   OT LONG TERM GOAL #5   Title Pt will increase strength to 4+/5 to improve ability to care for mother.    Time 12   Period Weeks   Status Partially Met               Plan - 06/19/15 1153    Clinical Impression Statement A: Increased weight in prone to 1#, sidelying to 3#, pt tolerated  well with no complaints of pain or discomfort. Added internal rotation towel stretch, as pt reports she is  still unable to reach her bra.    Plan P: Cont to to progress strengthening within pt's tolerance. Follow-up on towel stretch        Problem List Patient Active Problem List   Diagnosis Date Noted  . HTN (hypertension) 07/30/2013  . Dyslipidemia 07/30/2013  . Chest pain- low risk Myoview 8/13 07/30/2013    Guadelupe Sabin, OTR/L  [phone]  06/19/2015, 11:56 AM  [place], [place], [address] Phone: [phone]   Fax:  [phone]

## 2015-06-21 ENCOUNTER — Ambulatory Visit (HOSPITAL_COMMUNITY): Payer: Medicare Other

## 2015-06-21 ENCOUNTER — Encounter (HOSPITAL_COMMUNITY): Payer: Self-pay

## 2015-06-21 DIAGNOSIS — M629 Disorder of muscle, unspecified: Secondary | ICD-10-CM | POA: Diagnosis not present

## 2015-06-21 DIAGNOSIS — R531 Weakness: Secondary | ICD-10-CM

## 2015-06-21 DIAGNOSIS — M6281 Muscle weakness (generalized): Secondary | ICD-10-CM | POA: Diagnosis not present

## 2015-06-21 DIAGNOSIS — M25512 Pain in left shoulder: Secondary | ICD-10-CM | POA: Diagnosis not present

## 2015-06-21 DIAGNOSIS — M7582 Other shoulder lesions, left shoulder: Secondary | ICD-10-CM | POA: Diagnosis not present

## 2015-06-21 DIAGNOSIS — M25612 Stiffness of left shoulder, not elsewhere classified: Secondary | ICD-10-CM | POA: Diagnosis not present

## 2015-06-21 NOTE — Therapy (Signed)
[place], [place], [address] Phone: [phone]   Fax:  [phone]  Occupational Therapy Treatment  [name] MRN: [id number] Date of Birth: Jun 10, 1941 Referring Provider:  Elsie Saas, MD  Encounter Date: 06/21/2015      OT End of Session - 06/21/15 0846    Visit Number 40   Number of Visits 42   Date for OT Re-Evaluation 07/25/15  mini reassess on 9/3   Authorization Type Medicare A & B   Authorization Time Period before 40th visit and progress note   Authorization - Visit Number 39   Authorization - Number of Visits 40   OT Start Time 0800   OT Stop Time 0847   OT Time Calculation (min) 47 min   Activity Tolerance Patient tolerated treatment well   Behavior During Therapy [facility] for tasks assessed/performed      Past Medical History  Diagnosis Date  . HTN (hypertension)   . Dyslipidemia   . Rheumatic fever     as a child  . Hypothyroidism   . History of cardiac monitoring     Past Surgical History  Procedure Laterality Date  . Rotator cuff repair  1991  . Tubal ligation  1973  . Colonoscopy N/A 02/02/2015    Procedure: COLONOSCOPY;  Surgeon: Rogene Houston, MD;  [facility];  Service: Endoscopy;  Laterality: N/A;  930    There were no vitals filed for this visit.  Visit Diagnosis:  Stiffness of shoulder joint, left  Decreased strength      Subjective Assessment - 06/21/15 0817    Subjective  S: I see the doctor on Tuesday next week.    Currently in Pain? No/denies            [facility] OT Assessment - 06/21/15 0817    Assessment   Diagnosis s/p left RCR   Precautions   Precautions None   Type of Shoulder Precautions Increase weight each week. 1lb as tolerated.                  OT Treatments/Exercises (OP) - 06/21/15 0818    Exercises   Exercises Shoulder   Shoulder Exercises: Supine   Protraction PROM;5  reps;Strengthening;10 reps   Protraction Weight (lbs) 3   Horizontal ABduction PROM;5 reps;Strengthening;10 reps   Horizontal ABduction Weight (lbs) 3   External Rotation PROM;5 reps;Strengthening;10 reps   External Rotation Weight (lbs) 3   Internal Rotation PROM;5 reps;Strengthening;10 reps   Internal Rotation Weight (lbs) 3   Flexion PROM;5 reps;Strengthening;10 reps   Shoulder Flexion Weight (lbs) 3   ABduction PROM;5 reps;Strengthening;10 reps   Shoulder ABduction Weight (lbs) 3   Shoulder Exercises: Prone   Other Prone Exercises [place] exercises; 10X with 1#   Shoulder Exercises: Sidelying   External Rotation Strengthening;12 reps   External Rotation Weight (lbs) 3   Internal Rotation Strengthening;12 reps   Internal Rotation Weight (lbs) 3   Flexion Strengthening;12 reps   Flexion Weight (lbs) 3   ABduction Strengthening;12 reps   ABduction Weight (lbs) 3   Shoulder Exercises: Standing   Protraction Strengthening;15 reps   Protraction Weight (lbs) 2   Horizontal ABduction Strengthening;15 reps   Horizontal ABduction Weight (lbs) 2   External Rotation Strengthening;15 reps   External Rotation Weight (lbs) 2   Internal Rotation Strengthening;15 reps   Internal Rotation Weight (lbs) 2   Flexion Strengthening;15 reps   Shoulder Flexion  Weight (lbs) 2   ABduction Strengthening;15 reps   Shoulder ABduction Weight (lbs) 2   Extension Theraband;15 reps   Theraband Level (Shoulder Extension) Level 4 (Blue)   Row Theraband;15 reps   Theraband Level (Shoulder Row) Level 4 (Blue)   Retraction Theraband;15 reps   Theraband Level (Shoulder Retraction) Level 4 (Blue)   Shoulder Exercises: ROM/Strengthening   Cybex Press 3.5 plate;10 reps   Cybex Row 3.5 plate;15 reps   Pushups 15 reps  modified countertop   Proximal Shoulder Strengthening, Supine 12X with 3#   Proximal Shoulder Strengthening, Seated 15X with 3# with 1 rest break   Other ROM/Strengthening Exercises overhead  press with 2# 15 times mod difficulty   Manual Therapy   Manual Therapy Passive ROM   Passive ROM PROM completed to LUE to increase ROM and joint mobility.                  OT Short Term Goals - 05/26/15 0958    OT SHORT TERM GOAL #1   Title Pt will be educated on HEP.    Time 6   Period Weeks   OT SHORT TERM GOAL #2   Title Pt will decrease pain to 4/10 during daily activities.    Time 6   Period Weeks   OT SHORT TERM GOAL #3   Title Pt will decrease fascial restrictions from mod to min amounts.    Time 6   Period Weeks   OT SHORT TERM GOAL #4   Title Pt will increase PROM to [facility] to increase ability to participate in donning shirts.    Time 6   Period Weeks   OT SHORT TERM GOAL #5   Title Pt will increase strength to 3-/5 to increase ability to increase ability to assist in performing daily tasks.    Time 6   Period Weeks           OT Long Term Goals - 06/02/15 1022    OT LONG TERM GOAL #1   Title Pt will return to highest level of functioning and independence in all daily and leisure tasks.    Time 12   Period Weeks   Status On-going   OT LONG TERM GOAL #2   Title Pt will decrease pain to 1/10 or less during daily tasks.    Time 12   Period Weeks   OT LONG TERM GOAL #3   Title Pt will decrease fascial restrictions from min to trace amounts or less.    Time 12   Period Weeks   Status On-going   OT LONG TERM GOAL #4   Title Pt will increase AROM to WNL to increase ability to reach into overhead cabinets.    Time 12   Period Weeks   Status On-going   OT LONG TERM GOAL #5   Title Pt will increase strength to 4+/5 to improve ability to care for mother.    Time 12   Period Weeks   Status Partially Met               Plan - 06/21/15 0847    Clinical Impression Statement A: Increased weight and/or repetitions as patient was able to tolerate. Pt is making great progress towards therapy goals.    Plan P: Mini reassessment for possible discharge.  MD appt on Tuesday. Update G code and write progress note. Send to MD.         Problem List Patient Active Problem List  Diagnosis Date Noted  . HTN (hypertension) 07/30/2013  . Dyslipidemia 07/30/2013  . Chest pain- low risk Myoview 8/13 07/30/2013   Ailene Ravel, OTR/L,CBIS  [phone]  06/21/2015, 9:08 AM  [place], [place], [address] Phone: [phone]   Fax:  [phone]

## 2015-06-23 ENCOUNTER — Encounter: Payer: Self-pay | Admitting: Cardiovascular Disease

## 2015-06-23 ENCOUNTER — Encounter (HOSPITAL_COMMUNITY): Payer: Self-pay

## 2015-06-23 ENCOUNTER — Ambulatory Visit (HOSPITAL_COMMUNITY): Payer: Medicare Other | Attending: Orthopedic Surgery

## 2015-06-23 DIAGNOSIS — M6281 Muscle weakness (generalized): Secondary | ICD-10-CM | POA: Diagnosis not present

## 2015-06-23 DIAGNOSIS — M25612 Stiffness of left shoulder, not elsewhere classified: Secondary | ICD-10-CM | POA: Insufficient documentation

## 2015-06-23 DIAGNOSIS — R531 Weakness: Secondary | ICD-10-CM

## 2015-06-23 NOTE — Therapy (Signed)
Kaitlyn Baker, [place], [address] Phone: [phone]   Fax:  [phone]  Occupational Therapy Treatment and Reassessment  [name] MRN: [id number] Date of Birth: August 15, 1941 Referring Provider:  Elsie Saas, MD  Encounter Date: 06/23/2015      OT End of Session - 06/23/15 0902    Visit Number 41   Number of Visits 42   Authorization Type Medicare A & B   Authorization Time Period before 40th visit and progress note   Authorization - Visit Number 21   Authorization - Number of Visits 40   OT Start Time 0800   OT Stop Time 0845   OT Time Calculation (min) 45 min   Activity Tolerance Patient tolerated treatment well   Behavior During Therapy [facility] for tasks assessed/performed      Past Medical History  Diagnosis Date  . HTN (hypertension)   . Dyslipidemia   . Rheumatic fever     as a child  . Hypothyroidism   . History of cardiac monitoring     Past Surgical History  Procedure Laterality Date  . Rotator cuff repair  1991  . Tubal ligation  1973  . Colonoscopy N/A 02/02/2015    Procedure: COLONOSCOPY;  Surgeon: Rogene Houston, MD;  [facility];  Service: Endoscopy;  Laterality: N/A;  930    There were no vitals filed for this visit.  Visit Diagnosis:  Decreased strength  Stiffness of shoulder joint, left      Subjective Assessment - 06/23/15 0805    Subjective  S: This morning I just feel stiff.   Special Tests FOTO score: 69/100   Currently in Pain? No/denies            [facility] OT Assessment - 06/23/15 0810    Assessment   Diagnosis s/p left RCR   Precautions   Precautions None   Type of Shoulder Precautions Increase weight each week. 1lb as tolerated.   AROM   Overall AROM Comments Assessed seated. IR/ER adducted   AROM Assessment Site Shoulder   Right/Left Shoulder Left   Left Shoulder Flexion 150 Degrees  previous: 145   Left Shoulder  ABduction 140 Degrees  previous: 145   Left Shoulder Internal Rotation 90 Degrees  previous: same   Left Shoulder External Rotation 80 Degrees  previous: 65   PROM   Overall PROM Comments PROM is WFL in supine   Strength   Overall Strength Comments assessed in seated, ER/IR with shoulder adducted  (05/01/15)   Strength Assessment Site Shoulder   Right/Left Shoulder Left   Left Shoulder Flexion 5/5  previous: 4-/5   Left Shoulder ABduction 4+/5  previous: 4+/5   Left Shoulder Internal Rotation 4+/5  previous: same   Left Shoulder External Rotation 5/5  previous: 4+/5                  OT Treatments/Exercises (OP) - 06/23/15 0817    Exercises   Exercises Shoulder   Shoulder Exercises: Standing   Protraction Strengthening;10 reps   Protraction Weight (lbs) 3   Horizontal ABduction Strengthening;15 reps   Horizontal ABduction Weight (lbs) 2   External Rotation Strengthening;10 reps   External Rotation Weight (lbs) 3   Internal Rotation Strengthening;10 reps   Internal Rotation Weight (lbs) 3   Flexion Strengthening;10 reps   Shoulder Flexion Weight (lbs) 3   ABduction Strengthening;15 reps   Shoulder ABduction Weight (lbs) 2  Shoulder Exercises: ROM/Strengthening   Cybex Press 3.5 plate  09X   Cybex Row --  4 plate; 25 reps   Pushups 20 reps  countertop   "W" Arms 10X with 3#   X to V Arms 10X with 3#   Proximal Shoulder Strengthening, Seated 15X with 3# with 1 rest break   Other ROM/Strengthening Exercises overhead press with 3# 10 times min difficulty   Manual Therapy   Manual Therapy Passive ROM   Passive ROM PROM completed to LUE to increase ROM and joint mobility.                OT Education - 06/23/15 0855    Education provided Yes   Education Details Reviewed HEP. Recommended patient use green theraband for HEP at home. Pt was given 2 weeks free [employer] trial.   Person(s) Educated Patient   Methods Explanation   Comprehension Verbalized  understanding          OT Short Term Goals - 05/26/15 0958    OT SHORT TERM GOAL #1   Title Pt will be educated on HEP.    Time 6   Period Weeks   OT SHORT TERM GOAL #2   Title Pt will decrease pain to 4/10 during daily activities.    Time 6   Period Weeks   OT SHORT TERM GOAL #3   Title Pt will decrease fascial restrictions from mod to min amounts.    Time 6   Period Weeks   OT SHORT TERM GOAL #4   Title Pt will increase PROM to [facility] to increase ability to participate in donning shirts.    Time 6   Period Weeks   OT SHORT TERM GOAL #5   Title Pt will increase strength to 3-/5 to increase ability to increase ability to assist in performing daily tasks.    Time 6   Period Weeks           OT Long Term Goals - 06/23/15 0818    OT LONG TERM GOAL #1   Title Pt will return to highest level of functioning and independence in all daily and leisure tasks.    Time 12   Period Weeks   Status Achieved   OT LONG TERM GOAL #2   Title Pt will decrease pain to 1/10 or less during daily tasks.    Time 12   Period Weeks   OT LONG TERM GOAL #3   Title Pt will decrease fascial restrictions from min to trace amounts or less.    Time 12   Period Weeks   Status Achieved   OT LONG TERM GOAL #4   Title Pt will increase AROM to WNL to increase ability to reach into overhead cabinets.    Time 12   Period Weeks   Status Partially Met   OT LONG TERM GOAL #5   Title Pt will increase strength to 4+/5 to improve ability to care for mother.    Time 12   Period Weeks   Status Achieved               Plan - 06/23/15 8338    Clinical Impression Statement A: Mini reassessment completed this date. patient has met all therapy goals with one partially met which includes strength. Overall, patient has 4+/5 to 5/5 strength in LUE. Reviewed HEP. Pt was given 2 weeks free trial for the [facility]. Pt states that she is back to do everything she was before sx.  Plan P: D/C with HEP.           G-Codes - 06-29-2015 0908    Functional Assessment Tool Used FOTO score: 69/100 (31% impaired)    Functional Limitation Carrying, moving and handling objects   Carrying, Moving and Handling Objects Goal Status ([id number]) At least 20 percent but less than 40 percent impaired, limited or restricted   Carrying, Moving and Handling Objects Discharge Status [phone]) At least 20 percent but less than 40 percent impaired, limited or restricted      Problem List Patient Active Problem List   Diagnosis Date Noted  . HTN (hypertension) 07/30/2013  . Dyslipidemia 07/30/2013  . Chest pain- low risk Myoview 8/13 07/30/2013    OCCUPATIONAL THERAPY DISCHARGE SUMMARY  Visits from Start of Care: 41 Current functional level related to goals / functional outcomes: See goals above   Remaining deficits: Patient measures at 4+/5 to 5/5 for MMT with LUE. Pt reports that she is doing everything at home that she needs to be doing.    Education / Equipment: UB strengthening using theraband. Plan: Patient agrees to discharge.  Patient goals were met. Patient is being discharged due to meeting the stated rehab goals.  ?????       Ailene Ravel, OTR/L,CBIS  [phone]  06/29/15, 9:20 AM  [place] [address] [place], [place], [address] Phone: [phone]   Fax:  [phone]

## 2015-06-27 DIAGNOSIS — M75100 Unspecified rotator cuff tear or rupture of unspecified shoulder, not specified as traumatic: Secondary | ICD-10-CM | POA: Diagnosis not present

## 2015-07-17 DIAGNOSIS — H16103 Unspecified superficial keratitis, bilateral: Secondary | ICD-10-CM | POA: Diagnosis not present

## 2015-07-17 DIAGNOSIS — H04123 Dry eye syndrome of bilateral lacrimal glands: Secondary | ICD-10-CM | POA: Diagnosis not present

## 2015-07-17 DIAGNOSIS — H25813 Combined forms of age-related cataract, bilateral: Secondary | ICD-10-CM | POA: Diagnosis not present

## 2015-07-17 DIAGNOSIS — H40013 Open angle with borderline findings, low risk, bilateral: Secondary | ICD-10-CM | POA: Diagnosis not present

## 2015-07-31 DIAGNOSIS — Z23 Encounter for immunization: Secondary | ICD-10-CM | POA: Diagnosis not present

## 2015-08-09 DIAGNOSIS — Z6828 Body mass index (BMI) 28.0-28.9, adult: Secondary | ICD-10-CM | POA: Diagnosis not present

## 2015-08-09 DIAGNOSIS — M1991 Primary osteoarthritis, unspecified site: Secondary | ICD-10-CM | POA: Diagnosis not present

## 2015-08-09 DIAGNOSIS — I10 Essential (primary) hypertension: Secondary | ICD-10-CM | POA: Diagnosis not present

## 2015-08-09 DIAGNOSIS — Z1389 Encounter for screening for other disorder: Secondary | ICD-10-CM | POA: Diagnosis not present

## 2015-08-09 DIAGNOSIS — R109 Unspecified abdominal pain: Secondary | ICD-10-CM | POA: Diagnosis not present

## 2015-08-09 DIAGNOSIS — M7582 Other shoulder lesions, left shoulder: Secondary | ICD-10-CM | POA: Diagnosis not present

## 2015-08-09 DIAGNOSIS — E663 Overweight: Secondary | ICD-10-CM | POA: Diagnosis not present

## 2015-08-10 DIAGNOSIS — R7301 Impaired fasting glucose: Secondary | ICD-10-CM | POA: Diagnosis not present

## 2015-08-10 DIAGNOSIS — E785 Hyperlipidemia, unspecified: Secondary | ICD-10-CM | POA: Diagnosis not present

## 2015-08-10 DIAGNOSIS — E781 Pure hyperglyceridemia: Secondary | ICD-10-CM | POA: Diagnosis not present

## 2015-08-10 DIAGNOSIS — Z1389 Encounter for screening for other disorder: Secondary | ICD-10-CM | POA: Diagnosis not present

## 2015-08-10 DIAGNOSIS — E782 Mixed hyperlipidemia: Secondary | ICD-10-CM | POA: Diagnosis not present

## 2015-08-10 DIAGNOSIS — Z79899 Other long term (current) drug therapy: Secondary | ICD-10-CM | POA: Diagnosis not present

## 2015-08-10 DIAGNOSIS — R109 Unspecified abdominal pain: Secondary | ICD-10-CM | POA: Diagnosis not present

## 2015-08-16 ENCOUNTER — Other Ambulatory Visit (HOSPITAL_COMMUNITY): Payer: Self-pay | Admitting: Internal Medicine

## 2015-08-16 DIAGNOSIS — R1084 Generalized abdominal pain: Secondary | ICD-10-CM

## 2015-08-17 DIAGNOSIS — R109 Unspecified abdominal pain: Secondary | ICD-10-CM | POA: Diagnosis not present

## 2015-08-23 ENCOUNTER — Other Ambulatory Visit (HOSPITAL_COMMUNITY): Payer: Medicare Other

## 2015-08-31 ENCOUNTER — Other Ambulatory Visit (HOSPITAL_COMMUNITY): Payer: Self-pay | Admitting: Internal Medicine

## 2015-08-31 DIAGNOSIS — R109 Unspecified abdominal pain: Secondary | ICD-10-CM

## 2015-09-05 ENCOUNTER — Encounter (HOSPITAL_COMMUNITY)
Admission: RE | Admit: 2015-09-05 | Discharge: 2015-09-05 | Disposition: A | Discharge status: Home / Self Care | Payer: Medicare Other | Source: Ambulatory Visit | Attending: Internal Medicine | Admitting: Internal Medicine

## 2015-09-08 ENCOUNTER — Encounter (HOSPITAL_COMMUNITY)
Admission: RE | Admit: 2015-09-08 | Discharge: 2015-09-08 | Disposition: A | Discharge status: Home / Self Care | Payer: Medicare Other | Source: Ambulatory Visit | Attending: Internal Medicine | Admitting: Internal Medicine

## 2015-09-08 ENCOUNTER — Encounter (HOSPITAL_COMMUNITY): Payer: Self-pay

## 2015-09-08 DIAGNOSIS — R109 Unspecified abdominal pain: Secondary | ICD-10-CM | POA: Insufficient documentation

## 2015-09-08 MED ORDER — TECHNETIUM TC 99M MEBROFENIN IV KIT
5.0000 | PACK | Freq: Once | INTRAVENOUS | Status: DC | PRN
  Administered 2015-09-08: 5.1 via INTRAVENOUS
  Filled 2015-09-08: qty 6

## 2015-09-08 MED ORDER — STERILE WATER FOR INJECTION IJ SOLN
INTRAMUSCULAR | Status: AC
  Administered 2015-09-08: 1.46 mL via INTRAVENOUS
  Filled 2015-09-08: qty 10

## 2015-09-08 MED ORDER — SODIUM CHLORIDE 0.9 % IJ SOLN
INTRAMUSCULAR | Status: AC
  Filled 2015-09-08: qty 12

## 2015-09-08 MED ORDER — SINCALIDE 5 MCG IJ SOLR
INTRAMUSCULAR | Status: AC
Start: 2015-09-08 — End: 2015-09-08
  Administered 2015-09-08: 1.46 ug via INTRAVENOUS
  Filled 2015-09-08: qty 5

## 2015-10-04 ENCOUNTER — Other Ambulatory Visit (HOSPITAL_COMMUNITY): Payer: Self-pay | Admitting: Internal Medicine

## 2015-10-04 DIAGNOSIS — Z1231 Encounter for screening mammogram for malignant neoplasm of breast: Secondary | ICD-10-CM

## 2015-10-09 ENCOUNTER — Ambulatory Visit (HOSPITAL_COMMUNITY)
Admission: RE | Admit: 2015-10-09 | Discharge: 2015-10-09 | Disposition: A | Discharge status: Home / Self Care | Payer: Medicare Other | Source: Ambulatory Visit | Attending: Internal Medicine | Admitting: Internal Medicine

## 2015-10-09 DIAGNOSIS — Z1231 Encounter for screening mammogram for malignant neoplasm of breast: Secondary | ICD-10-CM | POA: Diagnosis present

## 2015-10-11 ENCOUNTER — Other Ambulatory Visit: Payer: Self-pay | Admitting: Internal Medicine

## 2015-10-11 DIAGNOSIS — R928 Other abnormal and inconclusive findings on diagnostic imaging of breast: Secondary | ICD-10-CM

## 2015-10-18 ENCOUNTER — Ambulatory Visit
Admission: RE | Admit: 2015-10-18 | Discharge: 2015-10-18 | Disposition: A | Discharge status: Home / Self Care | Payer: Medicare Other | Source: Ambulatory Visit | Attending: Internal Medicine | Admitting: Internal Medicine

## 2015-10-18 DIAGNOSIS — R928 Other abnormal and inconclusive findings on diagnostic imaging of breast: Secondary | ICD-10-CM

## 2015-10-18 DIAGNOSIS — R921 Mammographic calcification found on diagnostic imaging of breast: Secondary | ICD-10-CM | POA: Diagnosis not present

## 2016-02-09 DIAGNOSIS — H40012 Open angle with borderline findings, low risk, left eye: Secondary | ICD-10-CM | POA: Diagnosis not present

## 2016-02-09 DIAGNOSIS — H40011 Open angle with borderline findings, low risk, right eye: Secondary | ICD-10-CM | POA: Diagnosis not present

## 2016-02-09 DIAGNOSIS — H16103 Unspecified superficial keratitis, bilateral: Secondary | ICD-10-CM | POA: Diagnosis not present

## 2016-02-09 DIAGNOSIS — H04123 Dry eye syndrome of bilateral lacrimal glands: Secondary | ICD-10-CM | POA: Diagnosis not present

## 2016-02-12 DIAGNOSIS — Z1389 Encounter for screening for other disorder: Secondary | ICD-10-CM | POA: Diagnosis not present

## 2016-02-12 DIAGNOSIS — E785 Hyperlipidemia, unspecified: Secondary | ICD-10-CM | POA: Diagnosis not present

## 2016-02-12 DIAGNOSIS — E663 Overweight: Secondary | ICD-10-CM | POA: Diagnosis not present

## 2016-02-12 DIAGNOSIS — Z6826 Body mass index (BMI) 26.0-26.9, adult: Secondary | ICD-10-CM | POA: Diagnosis not present

## 2016-02-12 DIAGNOSIS — E781 Pure hyperglyceridemia: Secondary | ICD-10-CM | POA: Diagnosis not present

## 2016-02-12 DIAGNOSIS — R7309 Other abnormal glucose: Secondary | ICD-10-CM | POA: Diagnosis not present

## 2016-02-12 DIAGNOSIS — Z0000 Encounter for general adult medical examination without abnormal findings: Secondary | ICD-10-CM | POA: Diagnosis not present

## 2016-02-12 DIAGNOSIS — I10 Essential (primary) hypertension: Secondary | ICD-10-CM | POA: Diagnosis not present

## 2016-02-22 DIAGNOSIS — Z6825 Body mass index (BMI) 25.0-25.9, adult: Secondary | ICD-10-CM | POA: Diagnosis not present

## 2016-02-22 DIAGNOSIS — I10 Essential (primary) hypertension: Secondary | ICD-10-CM | POA: Diagnosis not present

## 2016-02-22 DIAGNOSIS — E782 Mixed hyperlipidemia: Secondary | ICD-10-CM | POA: Diagnosis not present

## 2016-02-22 DIAGNOSIS — R238 Other skin changes: Secondary | ICD-10-CM | POA: Diagnosis not present

## 2016-03-08 ENCOUNTER — Other Ambulatory Visit: Payer: Self-pay | Admitting: Internal Medicine

## 2016-03-08 DIAGNOSIS — R921 Mammographic calcification found on diagnostic imaging of breast: Secondary | ICD-10-CM

## 2016-03-11 DIAGNOSIS — L820 Inflamed seborrheic keratosis: Secondary | ICD-10-CM | POA: Diagnosis not present

## 2016-03-11 DIAGNOSIS — L570 Actinic keratosis: Secondary | ICD-10-CM | POA: Diagnosis not present

## 2016-03-11 DIAGNOSIS — X32XXXD Exposure to sunlight, subsequent encounter: Secondary | ICD-10-CM | POA: Diagnosis not present

## 2016-03-11 DIAGNOSIS — D0439 Carcinoma in situ of skin of other parts of face: Secondary | ICD-10-CM | POA: Diagnosis not present

## 2016-04-11 DIAGNOSIS — Z08 Encounter for follow-up examination after completed treatment for malignant neoplasm: Secondary | ICD-10-CM | POA: Diagnosis not present

## 2016-04-11 DIAGNOSIS — Z85828 Personal history of other malignant neoplasm of skin: Secondary | ICD-10-CM | POA: Diagnosis not present

## 2016-04-24 ENCOUNTER — Ambulatory Visit
Admission: RE | Admit: 2016-04-24 | Discharge: 2016-04-24 | Disposition: A | Discharge status: Home / Self Care | Payer: Medicare Other | Source: Ambulatory Visit | Attending: Internal Medicine | Admitting: Internal Medicine

## 2016-04-24 DIAGNOSIS — R921 Mammographic calcification found on diagnostic imaging of breast: Secondary | ICD-10-CM | POA: Diagnosis not present

## 2016-05-13 DIAGNOSIS — L821 Other seborrheic keratosis: Secondary | ICD-10-CM | POA: Diagnosis not present

## 2016-05-13 DIAGNOSIS — L820 Inflamed seborrheic keratosis: Secondary | ICD-10-CM | POA: Diagnosis not present

## 2016-05-16 DIAGNOSIS — E785 Hyperlipidemia, unspecified: Secondary | ICD-10-CM | POA: Diagnosis not present

## 2016-07-23 ENCOUNTER — Ambulatory Visit: Payer: Medicare Other | Admitting: Cardiovascular Disease

## 2016-08-07 ENCOUNTER — Encounter: Payer: Self-pay | Admitting: Cardiovascular Disease

## 2016-08-07 ENCOUNTER — Ambulatory Visit (INDEPENDENT_AMBULATORY_CARE_PROVIDER_SITE_OTHER): Payer: Medicare Other | Admitting: Cardiovascular Disease

## 2016-08-07 VITALS — BP 130/72 | HR 77 | Ht 66.0 in | Wt 166.2 lb

## 2016-08-07 DIAGNOSIS — I10 Essential (primary) hypertension: Secondary | ICD-10-CM

## 2016-08-07 NOTE — Progress Notes (Signed)
08/07/2016 Kaitlyn Baker   [birth year]  [id number]  Primary Physician Kaitlyn Herring., MD Primary Cardiologist: Kaitlyn Harp MD Kaitlyn Baker  HPI:  The patient is a this 75 year old mildly overweight married Caucasian female, mother of 67, whose husband is also a patient of mine. I last saw her 08/09/14. She has a history of hyperlipidemia, treated hypertension, and family history of heart disease. She was complaining of some atypical chest pain radiating to her back, which is a new finding for her 2 years ago. She had a Myoview stress test performed on May 28, 2012, which was entirely normal, and subsequent to that her symptoms completely resolved. Since I saw her 2 years ago she has been asymptomatic.    Current Outpatient Prescriptions  Medication Sig Dispense Refill  . amLODipine-benazepril (LOTREL) 10-20 MG per capsule Take 1 capsule by mouth daily.    Marland Kitchen aspirin 81 MG tablet Take 81 mg by mouth daily.    Marland Kitchen ibuprofen (ADVIL,MOTRIN) 600 MG tablet Take 600 mg by mouth every 6 (six) hours as needed for moderate pain.     Marland Kitchen levothyroxine (SYNTHROID, LEVOTHROID) 25 MCG tablet Take 25 mcg by mouth daily before breakfast.     . simvastatin (ZOCOR) 20 MG tablet Take 1 tablet by mouth daily.     No current facility-administered medications for this visit.     Allergies  Allergen Reactions  . Demerol [Meperidine] Swelling    Patient developed tongue swelling. He was treated with Benadryl and prednisone by PCP and is fine  . Versed [Midazolam]   . Depo-Medrol [Methylprednisolone] Rash    Severe rash and edema-tongue and vocal cords  . Moviprep [Peg-Kcl-Nacl-Nasulf-Na Asc-C] Itching    Severe itching all over, itching in the back of the throat, and lip swelling    Social History   Social History  . Marital status: Married    Spouse name: N/A  . Number of children: N/A  . Years of education: N/A   Occupational History  . Not on file.   Social History  Main Topics  . Smoking status: Never Smoker  . Smokeless tobacco: Not on file  . Alcohol use Yes     Comment: occasionally  . Drug use: No  . Sexual activity: Not on file   Other Topics Concern  . Not on file   Social History Narrative   Retired Therapist, sports     Review of Systems: General: negative for chills, fever, night sweats or weight changes.  Cardiovascular: negative for chest pain, dyspnea on exertion, edema, orthopnea, palpitations, paroxysmal nocturnal dyspnea or shortness of breath Dermatological: negative for rash Respiratory: negative for cough or wheezing Urologic: negative for hematuria Abdominal: negative for nausea, vomiting, diarrhea, bright red blood per rectum, melena, or hematemesis Neurologic: negative for visual changes, syncope, or dizziness All other systems reviewed and are otherwise negative except as noted above.    Blood pressure 130/72, pulse 77, height 5\' 6"  (1.676 m), weight 166 lb 3.2 oz (75.4 kg).  General appearance: alert and no distress Neck: no adenopathy, no carotid bruit, no JVD, supple, symmetrical, trachea midline and thyroid not enlarged, symmetric, no tenderness/mass/nodules Lungs: clear to auscultation bilaterally Heart: regular rate and rhythm, S1, S2 normal, no murmur, click, rub or gallop Extremities: extremities normal, atraumatic, no cyanosis or edema  EKG normal sinus rhythm at 77 without ST or T-wave changes. I personally reviewed this EKG  ASSESSMENT AND PLAN:   HTN (hypertension) History of hypertension blood  pressure measurements at 130/72. She is on amlodipine and benazepril. Continue current meds at current dosing  Dyslipidemia History of hyperlipidemia on statin therapy followed by her PCP      Kaitlyn Harp MD [facility], [facility] 08/07/2016 3:19 PM

## 2016-08-07 NOTE — Patient Instructions (Signed)
.  Medication Instructions:  NO CHANGES  Labwork: NONE  Follow-Up: Your physician wants you to follow-up in: WITH DR BERRY IN 12 MONTHS. You will receive a reminder letter in the mail two months in advance. If you don't receive a letter, please call our office to schedule the follow-up appointment.   If you need a refill on your cardiac medications before your next appointment, please call your pharmacy.

## 2016-08-07 NOTE — Assessment & Plan Note (Signed)
History of hyperlipidemia on statin therapy followed by her PCP. 

## 2016-08-07 NOTE — Assessment & Plan Note (Signed)
History of hypertension blood pressure measurements at 130/72. She is on amlodipine and benazepril. Continue current meds at current dosing

## 2016-08-12 DIAGNOSIS — X32XXXD Exposure to sunlight, subsequent encounter: Secondary | ICD-10-CM | POA: Diagnosis not present

## 2016-08-12 DIAGNOSIS — Z85828 Personal history of other malignant neoplasm of skin: Secondary | ICD-10-CM | POA: Diagnosis not present

## 2016-08-12 DIAGNOSIS — L821 Other seborrheic keratosis: Secondary | ICD-10-CM | POA: Diagnosis not present

## 2016-08-12 DIAGNOSIS — Z08 Encounter for follow-up examination after completed treatment for malignant neoplasm: Secondary | ICD-10-CM | POA: Diagnosis not present

## 2016-08-12 DIAGNOSIS — L570 Actinic keratosis: Secondary | ICD-10-CM | POA: Diagnosis not present

## 2016-08-12 DIAGNOSIS — B078 Other viral warts: Secondary | ICD-10-CM | POA: Diagnosis not present

## 2016-08-16 DIAGNOSIS — H25813 Combined forms of age-related cataract, bilateral: Secondary | ICD-10-CM | POA: Diagnosis not present

## 2016-08-16 DIAGNOSIS — H40011 Open angle with borderline findings, low risk, right eye: Secondary | ICD-10-CM | POA: Diagnosis not present

## 2016-08-16 DIAGNOSIS — H04123 Dry eye syndrome of bilateral lacrimal glands: Secondary | ICD-10-CM | POA: Diagnosis not present

## 2016-08-16 DIAGNOSIS — H16103 Unspecified superficial keratitis, bilateral: Secondary | ICD-10-CM | POA: Diagnosis not present

## 2016-08-20 DIAGNOSIS — Z23 Encounter for immunization: Secondary | ICD-10-CM | POA: Diagnosis not present

## 2016-09-17 ENCOUNTER — Other Ambulatory Visit: Payer: Self-pay | Admitting: Internal Medicine

## 2016-09-17 DIAGNOSIS — R921 Mammographic calcification found on diagnostic imaging of breast: Secondary | ICD-10-CM

## 2016-10-15 DIAGNOSIS — J069 Acute upper respiratory infection, unspecified: Secondary | ICD-10-CM | POA: Diagnosis not present

## 2016-10-15 DIAGNOSIS — Z6826 Body mass index (BMI) 26.0-26.9, adult: Secondary | ICD-10-CM | POA: Diagnosis not present

## 2016-10-15 DIAGNOSIS — Z1389 Encounter for screening for other disorder: Secondary | ICD-10-CM | POA: Diagnosis not present

## 2016-10-15 DIAGNOSIS — J209 Acute bronchitis, unspecified: Secondary | ICD-10-CM | POA: Diagnosis not present

## 2016-10-15 DIAGNOSIS — E663 Overweight: Secondary | ICD-10-CM | POA: Diagnosis not present

## 2016-10-25 ENCOUNTER — Ambulatory Visit
Admission: RE | Admit: 2016-10-25 | Discharge: 2016-10-25 | Disposition: A | Discharge status: Home / Self Care | Payer: Medicare Other | Source: Ambulatory Visit | Attending: Internal Medicine | Admitting: Internal Medicine

## 2016-10-25 DIAGNOSIS — R921 Mammographic calcification found on diagnostic imaging of breast: Secondary | ICD-10-CM

## 2017-02-21 DIAGNOSIS — H25813 Combined forms of age-related cataract, bilateral: Secondary | ICD-10-CM | POA: Diagnosis not present

## 2017-02-21 DIAGNOSIS — H40013 Open angle with borderline findings, low risk, bilateral: Secondary | ICD-10-CM | POA: Diagnosis not present

## 2017-02-21 DIAGNOSIS — H47323 Drusen of optic disc, bilateral: Secondary | ICD-10-CM | POA: Diagnosis not present

## 2017-02-21 DIAGNOSIS — H04123 Dry eye syndrome of bilateral lacrimal glands: Secondary | ICD-10-CM | POA: Diagnosis not present

## 2017-04-03 DIAGNOSIS — E663 Overweight: Secondary | ICD-10-CM | POA: Diagnosis not present

## 2017-04-03 DIAGNOSIS — E782 Mixed hyperlipidemia: Secondary | ICD-10-CM | POA: Diagnosis not present

## 2017-04-03 DIAGNOSIS — I10 Essential (primary) hypertension: Secondary | ICD-10-CM | POA: Diagnosis not present

## 2017-04-03 DIAGNOSIS — R7301 Impaired fasting glucose: Secondary | ICD-10-CM | POA: Diagnosis not present

## 2017-04-03 DIAGNOSIS — E785 Hyperlipidemia, unspecified: Secondary | ICD-10-CM | POA: Diagnosis not present

## 2017-04-03 DIAGNOSIS — Z86010 Personal history of colonic polyps: Secondary | ICD-10-CM | POA: Diagnosis not present

## 2017-04-11 DIAGNOSIS — Z1389 Encounter for screening for other disorder: Secondary | ICD-10-CM | POA: Diagnosis not present

## 2017-04-11 DIAGNOSIS — E063 Autoimmune thyroiditis: Secondary | ICD-10-CM | POA: Diagnosis not present

## 2017-04-11 DIAGNOSIS — G90522 Complex regional pain syndrome I of left lower limb: Secondary | ICD-10-CM | POA: Diagnosis not present

## 2017-04-11 DIAGNOSIS — I10 Essential (primary) hypertension: Secondary | ICD-10-CM | POA: Diagnosis not present

## 2017-04-11 DIAGNOSIS — E748 Other specified disorders of carbohydrate metabolism: Secondary | ICD-10-CM | POA: Diagnosis not present

## 2017-04-11 DIAGNOSIS — Z6827 Body mass index (BMI) 27.0-27.9, adult: Secondary | ICD-10-CM | POA: Diagnosis not present

## 2017-04-11 DIAGNOSIS — E663 Overweight: Secondary | ICD-10-CM | POA: Diagnosis not present

## 2017-04-11 DIAGNOSIS — M7061 Trochanteric bursitis, right hip: Secondary | ICD-10-CM | POA: Diagnosis not present

## 2017-04-11 DIAGNOSIS — L659 Nonscarring hair loss, unspecified: Secondary | ICD-10-CM | POA: Diagnosis not present

## 2017-04-17 DIAGNOSIS — Z8349 Family history of other endocrine, nutritional and metabolic diseases: Secondary | ICD-10-CM | POA: Diagnosis not present

## 2017-04-17 DIAGNOSIS — I10 Essential (primary) hypertension: Secondary | ICD-10-CM | POA: Diagnosis not present

## 2017-04-17 DIAGNOSIS — E7800 Pure hypercholesterolemia, unspecified: Secondary | ICD-10-CM | POA: Diagnosis not present

## 2017-04-17 DIAGNOSIS — M810 Age-related osteoporosis without current pathological fracture: Secondary | ICD-10-CM | POA: Diagnosis not present

## 2017-04-17 DIAGNOSIS — Z780 Asymptomatic menopausal state: Secondary | ICD-10-CM | POA: Diagnosis not present

## 2017-04-17 DIAGNOSIS — Z79899 Other long term (current) drug therapy: Secondary | ICD-10-CM | POA: Diagnosis not present

## 2017-04-17 DIAGNOSIS — E039 Hypothyroidism, unspecified: Secondary | ICD-10-CM | POA: Diagnosis not present

## 2017-05-14 DIAGNOSIS — E785 Hyperlipidemia, unspecified: Secondary | ICD-10-CM | POA: Diagnosis not present

## 2017-07-14 DIAGNOSIS — Z23 Encounter for immunization: Secondary | ICD-10-CM | POA: Diagnosis not present

## 2017-08-08 ENCOUNTER — Encounter: Payer: Self-pay | Admitting: Cardiovascular Disease

## 2017-08-08 ENCOUNTER — Ambulatory Visit (INDEPENDENT_AMBULATORY_CARE_PROVIDER_SITE_OTHER): Payer: Medicare Other | Admitting: Cardiovascular Disease

## 2017-08-08 DIAGNOSIS — I10 Essential (primary) hypertension: Secondary | ICD-10-CM | POA: Diagnosis not present

## 2017-08-08 DIAGNOSIS — E785 Hyperlipidemia, unspecified: Secondary | ICD-10-CM | POA: Diagnosis not present

## 2017-08-08 NOTE — Patient Instructions (Signed)

## 2017-08-08 NOTE — Assessment & Plan Note (Signed)
History of dyslipidemia on statin therapy with recent lipid profile performed by her PCP 04/03/17 revealing a total cholesterol 136, LDL 68 and HDL of 53.

## 2017-08-08 NOTE — Addendum Note (Signed)
Addended by: Zebedee Iba on: 08/08/2017 08:34 AM   Modules accepted: Orders

## 2017-08-08 NOTE — Assessment & Plan Note (Signed)
History of essential hypertension blood pressure measures 120/72. She is on amlodipine and benazepril. Continue current meds at current dosing.

## 2017-08-08 NOTE — Progress Notes (Signed)
08/08/2017 Kaitlyn Baker   10-18-1941  [id number]  Primary Physician Redmond School, MD Primary Cardiologist: Lorretta Harp MD [name], [name]  HPI:  Kaitlyn Baker is a 76 y.o. mildly overweight married Caucasian female, mother of 73, whose husband Kaitlyn Baker  is also a patient of mine. I last saw her 08/07/16. She has a history of hyperlipidemia, treated hypertension, and family history of heart disease. She was complaining of some atypical chest pain radiating to her back, which is a new finding for her 2 years ago. She had a Myoview stress test performed on May 28, 2012, which was entirely normal, and subsequent to that her symptoms completely resolved. Since I saw her a year ago she has remained completely symptomatic.   Current Meds  Medication Sig  . amLODipine-benazepril (LOTREL) 10-20 MG per capsule Take 1 capsule by mouth daily.  Marland Kitchen aspirin 81 MG tablet Take 81 mg by mouth daily.  Marland Kitchen ibuprofen (ADVIL,MOTRIN) 600 MG tablet Take 600 mg by mouth every 6 (six) hours as needed for moderate pain.   Marland Kitchen levothyroxine (SYNTHROID, LEVOTHROID) 50 MCG tablet Take 1 tablet by mouth daily.  . simvastatin (ZOCOR) 20 MG tablet Take 1 tablet by mouth daily.     Allergies  Allergen Reactions  . Demerol [Meperidine] Swelling    Patient developed tongue swelling. He was treated with Benadryl and prednisone by PCP and is fine  . Versed [Midazolam]   . Depo-Medrol [Methylprednisolone] Rash    Severe rash and edema-tongue and vocal cords  . Moviprep [Peg-Kcl-Nacl-Nasulf-Na Asc-C] Itching    Severe itching all over, itching in the back of the throat, and lip swelling    Social History   Social History  . Marital status: Married    Spouse name: N/A  . Number of children: N/A  . Years of education: N/A   Occupational History  . Not on file.   Social History Main Topics  . Smoking status: Never Smoker  . Smokeless tobacco: Never Used  . Alcohol use Yes     Comment:  occasionally  . Drug use: No  . Sexual activity: Not on file   Other Topics Concern  . Not on file   Social History Narrative   Retired Therapist, sports     Review of Systems: General: negative for chills, fever, night sweats or weight changes.  Cardiovascular: negative for chest pain, dyspnea on exertion, edema, orthopnea, palpitations, paroxysmal nocturnal dyspnea or shortness of breath Dermatological: negative for rash Respiratory: negative for cough or wheezing Urologic: negative for hematuria Abdominal: negative for nausea, vomiting, diarrhea, bright red blood per rectum, melena, or hematemesis Neurologic: negative for visual changes, syncope, or dizziness All other systems reviewed and are otherwise negative except as noted above.    Blood pressure 128/72, pulse 74, height 5\' 6"  (1.676 m), weight 174 lb (78.9 kg).  General appearance: alert and no distress Neck: no adenopathy, no carotid bruit, no JVD, supple, symmetrical, trachea midline and thyroid not enlarged, symmetric, no tenderness/mass/nodules Lungs: clear to auscultation bilaterally Heart: regular rate and rhythm, S1, S2 normal, no murmur, click, rub or gallop Extremities: extremities normal, atraumatic, no cyanosis or edema Pulses: 2+ and symmetric Skin: Skin color, texture, turgor normal. No rashes or lesions Neurologic: Alert and oriented X 3, normal strength and tone. Normal symmetric reflexes. Normal coordination and gait  EKG sinus rhythm at 74 without ST or T-wave changes. I personally reviewed this EKG.  ASSESSMENT AND PLAN:   HTN (  hypertension) History of essential hypertension blood pressure measures 120/72. She is on amlodipine and benazepril. Continue current meds at current dosing.  Dyslipidemia History of dyslipidemia on statin therapy with recent lipid profile performed by her PCP 04/03/17 revealing a total cholesterol 136, LDL 68 and HDL of 53.      Lorretta Harp MD FACP,FACC,FAHA,  [facility] 08/08/2017 8:18 AM

## 2017-08-22 DIAGNOSIS — H40013 Open angle with borderline findings, low risk, bilateral: Secondary | ICD-10-CM | POA: Diagnosis not present

## 2017-08-22 DIAGNOSIS — H25813 Combined forms of age-related cataract, bilateral: Secondary | ICD-10-CM | POA: Diagnosis not present

## 2017-08-22 DIAGNOSIS — H47323 Drusen of optic disc, bilateral: Secondary | ICD-10-CM | POA: Diagnosis not present

## 2017-09-19 ENCOUNTER — Other Ambulatory Visit: Payer: Self-pay | Admitting: Internal Medicine

## 2017-09-19 DIAGNOSIS — R921 Mammographic calcification found on diagnostic imaging of breast: Secondary | ICD-10-CM

## 2017-10-27 ENCOUNTER — Ambulatory Visit
Admission: RE | Admit: 2017-10-27 | Discharge: 2017-10-27 | Disposition: A | Discharge status: Home / Self Care | Payer: Medicare Other | Source: Ambulatory Visit | Attending: Internal Medicine | Admitting: Internal Medicine

## 2017-10-27 DIAGNOSIS — R921 Mammographic calcification found on diagnostic imaging of breast: Secondary | ICD-10-CM

## 2017-12-01 DIAGNOSIS — L299 Pruritus, unspecified: Secondary | ICD-10-CM | POA: Diagnosis not present

## 2017-12-01 DIAGNOSIS — N8189 Other female genital prolapse: Secondary | ICD-10-CM | POA: Diagnosis not present

## 2017-12-01 DIAGNOSIS — N6489 Other specified disorders of breast: Secondary | ICD-10-CM | POA: Diagnosis not present

## 2017-12-01 DIAGNOSIS — Z6828 Body mass index (BMI) 28.0-28.9, adult: Secondary | ICD-10-CM | POA: Diagnosis not present

## 2017-12-01 DIAGNOSIS — Z01419 Encounter for gynecological examination (general) (routine) without abnormal findings: Secondary | ICD-10-CM | POA: Diagnosis not present

## 2018-03-27 DIAGNOSIS — H40033 Anatomical narrow angle, bilateral: Secondary | ICD-10-CM | POA: Diagnosis not present

## 2018-03-27 DIAGNOSIS — H40013 Open angle with borderline findings, low risk, bilateral: Secondary | ICD-10-CM | POA: Diagnosis not present

## 2018-03-27 DIAGNOSIS — H25813 Combined forms of age-related cataract, bilateral: Secondary | ICD-10-CM | POA: Diagnosis not present

## 2018-04-03 DIAGNOSIS — Z79899 Other long term (current) drug therapy: Secondary | ICD-10-CM | POA: Diagnosis not present

## 2018-04-03 DIAGNOSIS — Z0000 Encounter for general adult medical examination without abnormal findings: Secondary | ICD-10-CM | POA: Diagnosis not present

## 2018-04-03 DIAGNOSIS — E782 Mixed hyperlipidemia: Secondary | ICD-10-CM | POA: Diagnosis not present

## 2018-04-03 DIAGNOSIS — E748 Other specified disorders of carbohydrate metabolism: Secondary | ICD-10-CM | POA: Diagnosis not present

## 2018-04-09 DIAGNOSIS — E663 Overweight: Secondary | ICD-10-CM | POA: Diagnosis not present

## 2018-04-09 DIAGNOSIS — Z6828 Body mass index (BMI) 28.0-28.9, adult: Secondary | ICD-10-CM | POA: Diagnosis not present

## 2018-04-09 DIAGNOSIS — Z1389 Encounter for screening for other disorder: Secondary | ICD-10-CM | POA: Diagnosis not present

## 2018-04-09 DIAGNOSIS — E063 Autoimmune thyroiditis: Secondary | ICD-10-CM | POA: Diagnosis not present

## 2018-04-09 DIAGNOSIS — R202 Paresthesia of skin: Secondary | ICD-10-CM | POA: Diagnosis not present

## 2018-04-09 DIAGNOSIS — Z0000 Encounter for general adult medical examination without abnormal findings: Secondary | ICD-10-CM | POA: Diagnosis not present

## 2018-04-09 DIAGNOSIS — D510 Vitamin B12 deficiency anemia due to intrinsic factor deficiency: Secondary | ICD-10-CM | POA: Diagnosis not present

## 2018-04-09 DIAGNOSIS — R5383 Other fatigue: Secondary | ICD-10-CM | POA: Diagnosis not present

## 2018-04-09 DIAGNOSIS — E782 Mixed hyperlipidemia: Secondary | ICD-10-CM | POA: Diagnosis not present

## 2018-05-06 DIAGNOSIS — D225 Melanocytic nevi of trunk: Secondary | ICD-10-CM | POA: Diagnosis not present

## 2018-05-06 DIAGNOSIS — Z08 Encounter for follow-up examination after completed treatment for malignant neoplasm: Secondary | ICD-10-CM | POA: Diagnosis not present

## 2018-05-06 DIAGNOSIS — X32XXXD Exposure to sunlight, subsequent encounter: Secondary | ICD-10-CM | POA: Diagnosis not present

## 2018-05-06 DIAGNOSIS — Z85828 Personal history of other malignant neoplasm of skin: Secondary | ICD-10-CM | POA: Diagnosis not present

## 2018-05-06 DIAGNOSIS — L570 Actinic keratosis: Secondary | ICD-10-CM | POA: Diagnosis not present

## 2018-08-04 DIAGNOSIS — Z23 Encounter for immunization: Secondary | ICD-10-CM | POA: Diagnosis not present

## 2018-08-11 ENCOUNTER — Ambulatory Visit (INDEPENDENT_AMBULATORY_CARE_PROVIDER_SITE_OTHER): Payer: Medicare Other | Admitting: Cardiovascular Disease

## 2018-08-11 ENCOUNTER — Encounter: Payer: Self-pay | Admitting: Cardiovascular Disease

## 2018-08-11 VITALS — BP 138/70 | HR 70 | Ht 66.0 in | Wt 178.6 lb

## 2018-08-11 DIAGNOSIS — E785 Hyperlipidemia, unspecified: Secondary | ICD-10-CM

## 2018-08-11 DIAGNOSIS — I10 Essential (primary) hypertension: Secondary | ICD-10-CM

## 2018-08-11 NOTE — Assessment & Plan Note (Signed)
History of hyperlipidemia on statin therapy with recent lab work performed 04/03/2018 revealing total cholesterol 130, LDL 75 and HDL 46.

## 2018-08-11 NOTE — Assessment & Plan Note (Signed)
History of essential hypertension blood pressure measured today at 138/70.  She is on amlodipine and benazepril.

## 2018-08-11 NOTE — Patient Instructions (Signed)
Medication Instructions:  Your physician recommends that you continue on your current medications as directed. Please refer to the Current Medication list given to you today.  If you need a refill on your cardiac medications before your next appointment, please call your pharmacy.   Lab work: none If you have labs (blood work) drawn today and your tests are completely normal, you will receive your results only by: . MyChart Message (if you have MyChart) OR . A paper copy in the mail If you have any lab test that is abnormal or we need to change your treatment, we will call you to review the results.  Testing/Procedures: none  Follow-Up: At [facility], you and your health needs are our priority.  As part of our continuing mission to provide you with exceptional heart care, we have created designated Provider Care Teams.  These Care Teams include your primary Cardiologist (physician) and Advanced Practice Providers (APPs -  Physician Assistants and Nurse Practitioners) who all work together to provide you with the care you need, when you need it. You will need a follow up appointment in 12 months.  Please call our office 2 months in advance to schedule this appointment.  You may see Dr. Berry or one of the following Advanced Practice Providers on your designated Care Team:   Luke Kilroy, PA-C Krista Kroeger, PA-C . Callie Goodrich, PA-C  Any Other Special Instructions Will Be Listed Below (If Applicable).    

## 2018-08-11 NOTE — Progress Notes (Signed)
08/11/2018 Kaitlyn Baker   1941-02-27  [id number]  Primary Physician Redmond School, MD Primary Cardiologist: Lorretta Harp MD [name], [name]  HPI:  Kaitlyn Baker is a 77 y.o.  mildly overweight married Caucasian female, mother of 82, whose husband Kaitlyn Baker  is also a patient of mine. I last saw her 08/08/2017. She has a history of hyperlipidemia, treated hypertension, and family history of heart disease. She was complaining of some atypical chest pain radiating to her back, which is a new finding for her 2 years ago. She had a Myoview stress test performed on May 28, 2012, which was entirely normal, and subsequent to that her symptoms completely resolved.  Since I saw her a year ago she is remained asymptomatic.  She did spend time in [place] with her 39 year old granddaughter earlier this year touring [place], [place] in [place].  She had no symptoms during her trip.  She she specifically denies chest pain or shortness of breath.  Recent lab work performed by her PCP on 04/03/2018 revealed total cholesterol 130, LDL 75 and HDL 46.   No outpatient medications have been marked as taking for the 08/11/18 encounter (Office Visit) with Lorretta Harp, MD.     Allergies  Allergen Reactions  . Demerol [Meperidine] Swelling    Patient developed tongue swelling. He was treated with Benadryl and prednisone by PCP and is fine  . Versed [Midazolam]   . Depo-Medrol [Methylprednisolone] Rash    Severe rash and edema-tongue and vocal cords  . Moviprep [Peg-Kcl-Nacl-Nasulf-Na Asc-C] Itching    Severe itching all over, itching in the back of the throat, and lip swelling    Social History   Socioeconomic History  . Marital status: Married    Spouse name: Not on file  . Number of children: Not on file  . Years of education: Not on file  . Highest education level: Not on file  Occupational History  . Not on file  Social Needs  . Financial resource  strain: Not on file  . Food insecurity:    Worry: Not on file    Inability: Not on file  . Transportation needs:    Medical: Not on file    Non-medical: Not on file  Tobacco Use  . Smoking status: Never Smoker  . Smokeless tobacco: Never Used  Substance and Sexual Activity  . Alcohol use: Yes    Comment: occasionally  . Drug use: No  . Sexual activity: Not on file  Lifestyle  . Physical activity:    Days per week: Not on file    Minutes per session: Not on file  . Stress: Not on file  Relationships  . Social connections:    Talks on phone: Not on file    Gets together: Not on file    Attends religious service: Not on file    Active member of club or organization: Not on file    Attends meetings of clubs or organizations: Not on file    Relationship status: Not on file  . Intimate partner violence:    Fear of current or ex partner: Not on file    Emotionally abused: Not on file    Physically abused: Not on file    Forced sexual activity: Not on file  Other Topics Concern  . Not on file  Social History Narrative   Retired Therapist, sports     Review of Systems: General: negative for chills, fever, night sweats or  weight changes.  Cardiovascular: negative for chest pain, dyspnea on exertion, edema, orthopnea, palpitations, paroxysmal nocturnal dyspnea or shortness of breath Dermatological: negative for rash Respiratory: negative for cough or wheezing Urologic: negative for hematuria Abdominal: negative for nausea, vomiting, diarrhea, bright red blood per rectum, melena, or hematemesis Neurologic: negative for visual changes, syncope, or dizziness All other systems reviewed and are otherwise negative except as noted above.    Blood pressure 138/70, pulse 70, height 5\' 6"  (1.676 m), weight 178 lb 9.6 oz (81 kg).  General appearance: alert and no distress Neck: no adenopathy, no carotid bruit, no JVD, supple, symmetrical, trachea midline and thyroid not enlarged, symmetric, no  tenderness/mass/nodules Lungs: clear to auscultation bilaterally Heart: regular rate and rhythm, S1, S2 normal, no murmur, click, rub or gallop Extremities: extremities normal, atraumatic, no cyanosis or edema Pulses: 2+ and symmetric Skin: Skin color, texture, turgor normal. No rashes or lesions Neurologic: Alert and oriented X 3, normal strength and tone. Normal symmetric reflexes. Normal coordination and gait  EKG sinus rhythm at 70 without ST or T wave changes.  I personally reviewed this EKG  ASSESSMENT AND PLAN:   HTN (hypertension) History of essential hypertension blood pressure measured today at 138/70.  She is on amlodipine and benazepril.  Dyslipidemia History of hyperlipidemia on statin therapy with recent lab work performed 04/03/2018 revealing total cholesterol 130, LDL 75 and HDL 46.      Lorretta Harp MD [facility], [facility] 08/11/2018 8:10 AM

## 2018-09-23 ENCOUNTER — Other Ambulatory Visit: Payer: Self-pay | Admitting: Internal Medicine

## 2018-09-23 DIAGNOSIS — Z1231 Encounter for screening mammogram for malignant neoplasm of breast: Secondary | ICD-10-CM

## 2018-10-30 ENCOUNTER — Ambulatory Visit
Admission: RE | Admit: 2018-10-30 | Discharge: 2018-10-30 | Disposition: A | Discharge status: Home / Self Care | Payer: Medicare HMO | Source: Ambulatory Visit | Attending: Internal Medicine | Admitting: Internal Medicine

## 2018-10-30 DIAGNOSIS — Z1231 Encounter for screening mammogram for malignant neoplasm of breast: Secondary | ICD-10-CM | POA: Diagnosis not present

## 2018-12-02 DIAGNOSIS — N952 Postmenopausal atrophic vaginitis: Secondary | ICD-10-CM | POA: Diagnosis not present

## 2018-12-02 DIAGNOSIS — Z01419 Encounter for gynecological examination (general) (routine) without abnormal findings: Secondary | ICD-10-CM | POA: Diagnosis not present

## 2018-12-02 DIAGNOSIS — Z6829 Body mass index (BMI) 29.0-29.9, adult: Secondary | ICD-10-CM | POA: Diagnosis not present

## 2018-12-02 DIAGNOSIS — N819 Female genital prolapse, unspecified: Secondary | ICD-10-CM | POA: Diagnosis not present

## 2018-12-21 DIAGNOSIS — H25813 Combined forms of age-related cataract, bilateral: Secondary | ICD-10-CM | POA: Diagnosis not present

## 2018-12-21 DIAGNOSIS — H52203 Unspecified astigmatism, bilateral: Secondary | ICD-10-CM | POA: Diagnosis not present

## 2018-12-21 DIAGNOSIS — H47323 Drusen of optic disc, bilateral: Secondary | ICD-10-CM | POA: Diagnosis not present

## 2018-12-21 DIAGNOSIS — H40013 Open angle with borderline findings, low risk, bilateral: Secondary | ICD-10-CM | POA: Diagnosis not present

## 2019-01-01 DIAGNOSIS — R69 Illness, unspecified: Secondary | ICD-10-CM | POA: Diagnosis not present

## 2019-04-05 DIAGNOSIS — M1991 Primary osteoarthritis, unspecified site: Secondary | ICD-10-CM | POA: Diagnosis not present

## 2019-04-05 DIAGNOSIS — E785 Hyperlipidemia, unspecified: Secondary | ICD-10-CM | POA: Diagnosis not present

## 2019-04-05 DIAGNOSIS — M5136 Other intervertebral disc degeneration, lumbar region: Secondary | ICD-10-CM | POA: Diagnosis not present

## 2019-04-05 DIAGNOSIS — E039 Hypothyroidism, unspecified: Secondary | ICD-10-CM | POA: Diagnosis not present

## 2019-04-05 DIAGNOSIS — E782 Mixed hyperlipidemia: Secondary | ICD-10-CM | POA: Diagnosis not present

## 2019-04-05 DIAGNOSIS — I10 Essential (primary) hypertension: Secondary | ICD-10-CM | POA: Diagnosis not present

## 2019-04-12 DIAGNOSIS — E063 Autoimmune thyroiditis: Secondary | ICD-10-CM | POA: Diagnosis not present

## 2019-04-12 DIAGNOSIS — Z1389 Encounter for screening for other disorder: Secondary | ICD-10-CM | POA: Diagnosis not present

## 2019-04-12 DIAGNOSIS — R42 Dizziness and giddiness: Secondary | ICD-10-CM | POA: Diagnosis not present

## 2019-04-12 DIAGNOSIS — H8113 Benign paroxysmal vertigo, bilateral: Secondary | ICD-10-CM | POA: Diagnosis not present

## 2019-04-12 DIAGNOSIS — Z6829 Body mass index (BMI) 29.0-29.9, adult: Secondary | ICD-10-CM | POA: Diagnosis not present

## 2019-04-12 DIAGNOSIS — Z0001 Encounter for general adult medical examination with abnormal findings: Secondary | ICD-10-CM | POA: Diagnosis not present

## 2019-04-12 DIAGNOSIS — I10 Essential (primary) hypertension: Secondary | ICD-10-CM | POA: Diagnosis not present

## 2019-04-26 DIAGNOSIS — X32XXXD Exposure to sunlight, subsequent encounter: Secondary | ICD-10-CM | POA: Diagnosis not present

## 2019-04-26 DIAGNOSIS — L570 Actinic keratosis: Secondary | ICD-10-CM | POA: Diagnosis not present

## 2019-04-26 DIAGNOSIS — C44311 Basal cell carcinoma of skin of nose: Secondary | ICD-10-CM | POA: Diagnosis not present

## 2019-06-07 ENCOUNTER — Telehealth: Payer: Self-pay

## 2019-06-07 DIAGNOSIS — Z08 Encounter for follow-up examination after completed treatment for malignant neoplasm: Secondary | ICD-10-CM | POA: Diagnosis not present

## 2019-06-07 DIAGNOSIS — Z85828 Personal history of other malignant neoplasm of skin: Secondary | ICD-10-CM | POA: Diagnosis not present

## 2019-06-07 DIAGNOSIS — L820 Inflamed seborrheic keratosis: Secondary | ICD-10-CM | POA: Diagnosis not present

## 2019-06-07 NOTE — Telephone Encounter (Signed)
    COVID-19 Pre-Screening Questions:  . In the past 7 to 10 days have you had a cough,  shortness of breath, headache, congestion, fever (100 or greater) body aches, chills, sore throat, or sudden loss of taste or sense of smell? SOB AND FATIGUE . Have you been around anyone with known Covid 19? NO . Have you been around anyone who is awaiting Covid 19 test results in the past 7 to 10 days? NO . Have you been around anyone who has been exposed to Covid 19, or has mentioned symptoms of Covid 19 within the past 7 to 10 days? NO  If you have any concerns/questions about symptoms patients report during screening (either on the phone or at threshold). Contact the provider seeing the patient or DOD for further guidance.  If neither are available contact a member of the leadership team.   .hc  Pt c/o SOB and fatigue since past week. Thinks it is weather-related. She states this is new onset and she has not had these symptoms in the past. Denies CP, palps, and states she has no other symptoms except the SOB and fatigue. Last OV 07/2018. Reviewed HPI: "HPI:  Kaitlyn Baker is a 78 y.o.  mildly overweight married Caucasian female, mother of 13, whose husbandJohnis also a patient of mine. I last saw her 08/08/2017. She has a history of hyperlipidemia, treated hypertension, and family history of heart disease. She was complaining of some atypical chest pain radiating to her back, which is a new finding for her 2 years ago. She had a Myoview stress test performed on May 28, 2012, which was entirely normal, and subsequent to that her symptoms completely resolved.  Since I saw her a year ago she is remained asymptomatic.  She did spend time in [place] with her 6 year old granddaughter earlier this year touring [place], [place] in [place].  She had no symptoms during her trip.  She she specifically denies chest pain or shortness of breath.  Recent lab work performed by her PCP on  04/03/2018 revealed total cholesterol 130, LDL 75 and HDL 46"  Informed pt that d/t symptoms and their new onset and also last OV notes, her 8/19 appt will need to be rescheduled and converted to a virtual visit. Also informed pt that during virtual visit Dr. Gwenlyn Found may make recommendations for testing to determine reason for symptoms and can also determine how soon pt to be seen in the office. Pt states that she is upset and does not want virtual visit and that she was able to attend appt with another specialty without restrictions. Informed pt that nurse would contact supervisor to advise and then call her back with updates. Pt verbalized understanding  Contacted Bernardo Heater, RN and discussed above info. Advised that d/t current crisis, office visit precautions to be maintained. Also advised that pt may follow up with her PCP to be cleared of symptoms.  Contacted pt again. Informed her that office precautions to be maintained d/t her symptoms. Offered her virtual appt again. Pt refused. Recommended f/u with PCP to clear her symptoms. Pt still upset and states she will be in contact with office for her records to be transferred to another card. Apologized to pt for inconvenience. Pt states she is not blaming nurse for situation. Pt requested appt for 8/19 to be cancelled

## 2019-06-09 ENCOUNTER — Ambulatory Visit: Payer: Medicare HMO | Admitting: Cardiovascular Disease

## 2019-06-16 DIAGNOSIS — Z6828 Body mass index (BMI) 28.0-28.9, adult: Secondary | ICD-10-CM | POA: Diagnosis not present

## 2019-06-16 DIAGNOSIS — I10 Essential (primary) hypertension: Secondary | ICD-10-CM | POA: Diagnosis not present

## 2019-06-16 DIAGNOSIS — R0609 Other forms of dyspnea: Secondary | ICD-10-CM | POA: Diagnosis not present

## 2019-06-16 DIAGNOSIS — R079 Chest pain, unspecified: Secondary | ICD-10-CM | POA: Diagnosis not present

## 2019-06-17 ENCOUNTER — Other Ambulatory Visit (HOSPITAL_COMMUNITY): Payer: Self-pay | Admitting: Internal Medicine

## 2019-06-17 ENCOUNTER — Other Ambulatory Visit: Payer: Self-pay

## 2019-06-17 ENCOUNTER — Ambulatory Visit (HOSPITAL_COMMUNITY)
Admission: RE | Admit: 2019-06-17 | Discharge: 2019-06-17 | Disposition: A | Discharge status: Home / Self Care | Payer: Medicare HMO | Source: Ambulatory Visit | Attending: Internal Medicine | Admitting: Internal Medicine

## 2019-06-17 DIAGNOSIS — R0600 Dyspnea, unspecified: Secondary | ICD-10-CM | POA: Diagnosis not present

## 2019-06-17 DIAGNOSIS — R0602 Shortness of breath: Secondary | ICD-10-CM | POA: Diagnosis not present

## 2019-07-02 DIAGNOSIS — H40033 Anatomical narrow angle, bilateral: Secondary | ICD-10-CM | POA: Diagnosis not present

## 2019-07-02 DIAGNOSIS — H04123 Dry eye syndrome of bilateral lacrimal glands: Secondary | ICD-10-CM | POA: Diagnosis not present

## 2019-07-02 DIAGNOSIS — H47323 Drusen of optic disc, bilateral: Secondary | ICD-10-CM | POA: Diagnosis not present

## 2019-07-02 DIAGNOSIS — H25813 Combined forms of age-related cataract, bilateral: Secondary | ICD-10-CM | POA: Diagnosis not present

## 2019-07-13 DIAGNOSIS — R69 Illness, unspecified: Secondary | ICD-10-CM | POA: Diagnosis not present

## 2019-07-28 DIAGNOSIS — Z23 Encounter for immunization: Secondary | ICD-10-CM | POA: Diagnosis not present

## 2019-09-27 ENCOUNTER — Other Ambulatory Visit: Payer: Self-pay | Admitting: Internal Medicine

## 2019-09-27 DIAGNOSIS — Z1231 Encounter for screening mammogram for malignant neoplasm of breast: Secondary | ICD-10-CM

## 2019-11-16 ENCOUNTER — Other Ambulatory Visit: Payer: Self-pay

## 2019-11-16 ENCOUNTER — Ambulatory Visit
Admission: RE | Admit: 2019-11-16 | Discharge: 2019-11-16 | Disposition: A | Discharge status: Home / Self Care | Payer: Medicare HMO | Source: Ambulatory Visit | Attending: Internal Medicine | Admitting: Internal Medicine

## 2019-11-16 DIAGNOSIS — Z1231 Encounter for screening mammogram for malignant neoplasm of breast: Secondary | ICD-10-CM

## 2019-12-06 DIAGNOSIS — N819 Female genital prolapse, unspecified: Secondary | ICD-10-CM | POA: Diagnosis not present

## 2019-12-06 DIAGNOSIS — Z01419 Encounter for gynecological examination (general) (routine) without abnormal findings: Secondary | ICD-10-CM | POA: Diagnosis not present

## 2019-12-06 DIAGNOSIS — N952 Postmenopausal atrophic vaginitis: Secondary | ICD-10-CM | POA: Diagnosis not present

## 2019-12-06 DIAGNOSIS — Z6829 Body mass index (BMI) 29.0-29.9, adult: Secondary | ICD-10-CM | POA: Diagnosis not present

## 2020-01-17 ENCOUNTER — Encounter (INDEPENDENT_AMBULATORY_CARE_PROVIDER_SITE_OTHER): Payer: Self-pay | Admitting: *Deleted

## 2020-01-20 DIAGNOSIS — R69 Illness, unspecified: Secondary | ICD-10-CM | POA: Diagnosis not present

## 2020-02-08 ENCOUNTER — Emergency Department (HOSPITAL_COMMUNITY): Payer: Medicare HMO

## 2020-02-08 ENCOUNTER — Other Ambulatory Visit: Payer: Self-pay

## 2020-02-08 ENCOUNTER — Emergency Department (HOSPITAL_COMMUNITY)
Admission: EM | Admit: 2020-02-08 | Discharge: 2020-02-08 | Disposition: A | Discharge status: Home / Self Care | Payer: Medicare HMO | Attending: Emergency Medicine | Admitting: Emergency Medicine

## 2020-02-08 ENCOUNTER — Encounter (HOSPITAL_COMMUNITY): Payer: Self-pay | Admitting: Emergency Medicine

## 2020-02-08 DIAGNOSIS — Z7982 Long term (current) use of aspirin: Secondary | ICD-10-CM | POA: Diagnosis not present

## 2020-02-08 DIAGNOSIS — E039 Hypothyroidism, unspecified: Secondary | ICD-10-CM | POA: Diagnosis not present

## 2020-02-08 DIAGNOSIS — Y939 Activity, unspecified: Secondary | ICD-10-CM | POA: Insufficient documentation

## 2020-02-08 DIAGNOSIS — Y999 Unspecified external cause status: Secondary | ICD-10-CM | POA: Insufficient documentation

## 2020-02-08 DIAGNOSIS — Z03818 Encounter for observation for suspected exposure to other biological agents ruled out: Secondary | ICD-10-CM | POA: Diagnosis not present

## 2020-02-08 DIAGNOSIS — Z20822 Contact with and (suspected) exposure to covid-19: Secondary | ICD-10-CM | POA: Insufficient documentation

## 2020-02-08 DIAGNOSIS — Y92410 Unspecified street and highway as the place of occurrence of the external cause: Secondary | ICD-10-CM | POA: Diagnosis not present

## 2020-02-08 DIAGNOSIS — S12121A Other nondisplaced dens fracture, initial encounter for closed fracture: Secondary | ICD-10-CM | POA: Diagnosis not present

## 2020-02-08 DIAGNOSIS — Z79899 Other long term (current) drug therapy: Secondary | ICD-10-CM | POA: Insufficient documentation

## 2020-02-08 DIAGNOSIS — S12190A Other displaced fracture of second cervical vertebra, initial encounter for closed fracture: Secondary | ICD-10-CM | POA: Diagnosis not present

## 2020-02-08 DIAGNOSIS — S3991XA Unspecified injury of abdomen, initial encounter: Secondary | ICD-10-CM | POA: Diagnosis not present

## 2020-02-08 DIAGNOSIS — I10 Essential (primary) hypertension: Secondary | ICD-10-CM | POA: Insufficient documentation

## 2020-02-08 DIAGNOSIS — S12100A Unspecified displaced fracture of second cervical vertebra, initial encounter for closed fracture: Secondary | ICD-10-CM | POA: Diagnosis not present

## 2020-02-08 DIAGNOSIS — T1490XA Injury, unspecified, initial encounter: Secondary | ICD-10-CM

## 2020-02-08 DIAGNOSIS — S3992XA Unspecified injury of lower back, initial encounter: Secondary | ICD-10-CM | POA: Diagnosis not present

## 2020-02-08 DIAGNOSIS — S299XXA Unspecified injury of thorax, initial encounter: Secondary | ICD-10-CM | POA: Diagnosis not present

## 2020-02-08 DIAGNOSIS — S0990XA Unspecified injury of head, initial encounter: Secondary | ICD-10-CM | POA: Diagnosis not present

## 2020-02-08 DIAGNOSIS — S199XXA Unspecified injury of neck, initial encounter: Secondary | ICD-10-CM | POA: Diagnosis not present

## 2020-02-08 DIAGNOSIS — M542 Cervicalgia: Secondary | ICD-10-CM | POA: Diagnosis not present

## 2020-02-08 LAB — BASIC METABOLIC PANEL
Anion gap: 10 (ref 5–15)
BUN: 15 mg/dL (ref 8–23)
CO2: 23 mmol/L (ref 22–32)
Calcium: 9.6 mg/dL (ref 8.9–10.3)
Chloride: 104 mmol/L (ref 98–111)
Creatinine, Ser: 0.79 mg/dL (ref 0.44–1.00)
GFR calc Af Amer: >60 mL/min (ref >60)
GFR calc non Af Amer: >60 mL/min (ref >60)
Glucose, Bld: 135 mg/dL — ABNORMAL HIGH (ref 70–99)
Potassium: 4 mmol/L (ref 3.5–5.1)
Sodium: 137 mmol/L (ref 135–145)

## 2020-02-08 LAB — PROTIME-INR
INR: 1 (ref 0.8–1.2)
Prothrombin Time: 12.5 seconds (ref 11.4–15.2)

## 2020-02-08 LAB — CBC
HCT: 46 % (ref 36.0–46.0)
Hemoglobin: 14.8 g/dL (ref 12.0–15.0)
MCH: 28.2 pg (ref 26.0–34.0)
MCHC: 32.2 g/dL (ref 30.0–36.0)
MCV: 87.8 fL (ref 80.0–100.0)
Platelets: 254 10*3/uL (ref 150–400)
RBC: 5.24 MIL/uL — ABNORMAL HIGH (ref 3.87–5.11)
RDW: 13.8 % (ref 11.5–15.5)
WBC: 21 10*3/uL — ABNORMAL HIGH (ref 4.0–10.5)
nRBC: 0 % (ref 0.0–0.2)

## 2020-02-08 LAB — RESPIRATORY PANEL BY RT PCR (FLU A&B, COVID)
Influenza A by PCR: NEGATIVE
Influenza B by PCR: NEGATIVE
SARS Coronavirus 2 by RT PCR: NEGATIVE

## 2020-02-08 LAB — LACTIC ACID, PLASMA: Lactic Acid, Venous: 2.1 mmol/L (ref 0.5–1.9)

## 2020-02-08 LAB — SAMPLE TO BLOOD BANK

## 2020-02-08 MED ORDER — FENTANYL CITRATE (PF) 100 MCG/2ML IJ SOLN
25.0000 ug | Freq: Once | INTRAMUSCULAR | Status: AC
  Administered 2020-02-08: 25 ug via INTRAVENOUS
  Filled 2020-02-08: qty 2

## 2020-02-08 MED ORDER — IOHEXOL 350 MG/ML SOLN
100.0000 mL | Freq: Once | INTRAVENOUS | Status: AC | PRN
  Administered 2020-02-08: 100 mL via INTRAVENOUS

## 2020-02-08 MED ORDER — HYDROMORPHONE HCL 1 MG/ML IJ SOLN: 0.5000 mg | Freq: Once | INTRAMUSCULAR | Status: DC

## 2020-02-08 MED ORDER — FENTANYL CITRATE (PF) 100 MCG/2ML IJ SOLN
50.0000 ug | Freq: Once | INTRAMUSCULAR | Status: AC
  Administered 2020-02-08: 50 ug via INTRAVENOUS
  Filled 2020-02-08: qty 2

## 2020-02-08 MED ORDER — HYDROCODONE-ACETAMINOPHEN 5-325 MG PO TABS: 2.0000 | ORAL_TABLET | Freq: Once | ORAL | Status: DC
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
## 2020-02-08 NOTE — Discharge Instructions (Addendum)
Please wear the cervical hard collar at all times.  Please take a daily 81 mg aspirin at least until seen by neurosurgery in 6 weeks.

## 2020-02-08 NOTE — ED Notes (Signed)
Patient verbalizes understanding of discharge instructions. Opportunity for questioning and answers were provided. Pt discharged from ED stable & ambulatory with family.

## 2020-02-08 NOTE — ED Notes (Signed)
Patient unable to void at this time
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
## 2020-02-13 ENCOUNTER — Emergency Department (HOSPITAL_COMMUNITY)
Admission: EM | Admit: 2020-02-13 | Discharge: 2020-02-13 | Disposition: A | Discharge status: Home / Self Care | Payer: Medicare HMO | Attending: Emergency Medicine | Admitting: Emergency Medicine

## 2020-02-13 ENCOUNTER — Encounter (HOSPITAL_COMMUNITY): Payer: Self-pay | Admitting: Emergency Medicine

## 2020-02-13 ENCOUNTER — Other Ambulatory Visit: Payer: Self-pay

## 2020-02-13 DIAGNOSIS — S12101D Unspecified nondisplaced fracture of second cervical vertebra, subsequent encounter for fracture with routine healing: Secondary | ICD-10-CM | POA: Insufficient documentation

## 2020-02-13 DIAGNOSIS — I10 Essential (primary) hypertension: Secondary | ICD-10-CM | POA: Diagnosis not present

## 2020-02-13 DIAGNOSIS — E039 Hypothyroidism, unspecified: Secondary | ICD-10-CM | POA: Diagnosis not present

## 2020-02-13 DIAGNOSIS — M542 Cervicalgia: Secondary | ICD-10-CM | POA: Diagnosis not present

## 2020-02-13 DIAGNOSIS — Z7982 Long term (current) use of aspirin: Secondary | ICD-10-CM | POA: Insufficient documentation

## 2020-02-13 DIAGNOSIS — Z79899 Other long term (current) drug therapy: Secondary | ICD-10-CM | POA: Insufficient documentation

## 2020-02-13 DIAGNOSIS — R52 Pain, unspecified: Secondary | ICD-10-CM | POA: Diagnosis not present

## 2020-02-13 DIAGNOSIS — G4489 Other headache syndrome: Secondary | ICD-10-CM | POA: Diagnosis not present

## 2020-02-13 DIAGNOSIS — S12121D Other nondisplaced dens fracture, subsequent encounter for fracture with routine healing: Secondary | ICD-10-CM | POA: Diagnosis not present

## 2020-02-13 MED ORDER — OXYCODONE-ACETAMINOPHEN 5-325 MG PO TABS: 1.0000 | ORAL_TABLET | ORAL | 0 refills | Status: DC | PRN

## 2020-02-13 MED ORDER — DIAZEPAM 2 MG PO TABS: 2.0000 mg | ORAL_TABLET | Freq: Three times a day (TID) | ORAL | 0 refills | Status: DC | PRN

## 2020-02-13 MED ORDER — ONDANSETRON 8 MG PO TBDP
8.0000 mg | ORAL_TABLET | Freq: Once | ORAL | Status: AC
  Administered 2020-02-13: 8 mg via ORAL
  Filled 2020-02-13: qty 1

## 2020-02-13 MED ORDER — HYDROMORPHONE HCL 1 MG/ML IJ SOLN
1.0000 mg | Freq: Once | INTRAMUSCULAR | Status: AC
  Administered 2020-02-13: 1 mg via INTRAMUSCULAR
  Filled 2020-02-13: qty 1
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
## 2020-02-14 ENCOUNTER — Telehealth (INDEPENDENT_AMBULATORY_CARE_PROVIDER_SITE_OTHER): Payer: Self-pay | Admitting: *Deleted

## 2020-02-14 MED FILL — Oxycodone w/ Acetaminophen Tab 5-325 MG: ORAL | Qty: 6 | Status: AC

## 2020-02-14 NOTE — Telephone Encounter (Signed)
Referring MD/PCP: fusco   Procedure: tcs  Reason/Indication:  Hx polyps  Has patient had this procedure before?  Yes, 2016 (polyps) & 2005 (normal)  If so, when, by whom and where?    Is there a family history of colon cancer?  no  Who?  What age when diagnosed?    Is patient diabetic?   no      Does patient have prosthetic heart valve or mechanical valve?  no  Do you have a pacemaker/defibrillator?  no  Has patient ever had endocarditis/atrial fibrillation? no  Does patient use oxygen? no  Has patient had joint replacement within last 12 months?  no  Is patient constipated or do they take laxatives? no  Does patient have a history of alcohol/drug use?  no  Is patient on blood thinner such as Coumadin, Plavix and/or Aspirin? no  Medications: simvastatin 20 mg daily, amlodipine 10/20 mg daily, levothyroxine 50 mg daily, vit d3 1000 bid  Allergies: depomedrol, versed, morphine  Medication Adjustment per Dr Laural Golden:   Procedure date & time:

## 2020-02-17 DIAGNOSIS — I10 Essential (primary) hypertension: Secondary | ICD-10-CM | POA: Diagnosis not present

## 2020-02-17 DIAGNOSIS — M5481 Occipital neuralgia: Secondary | ICD-10-CM | POA: Diagnosis not present

## 2020-02-17 DIAGNOSIS — S129XXA Fracture of neck, unspecified, initial encounter: Secondary | ICD-10-CM | POA: Diagnosis not present

## 2020-03-16 DIAGNOSIS — L820 Inflamed seborrheic keratosis: Secondary | ICD-10-CM | POA: Diagnosis not present

## 2020-03-22 DIAGNOSIS — E063 Autoimmune thyroiditis: Secondary | ICD-10-CM | POA: Diagnosis not present

## 2020-03-22 DIAGNOSIS — Z1389 Encounter for screening for other disorder: Secondary | ICD-10-CM | POA: Diagnosis not present

## 2020-03-22 DIAGNOSIS — Z6827 Body mass index (BMI) 27.0-27.9, adult: Secondary | ICD-10-CM | POA: Diagnosis not present

## 2020-03-22 DIAGNOSIS — I10 Essential (primary) hypertension: Secondary | ICD-10-CM | POA: Diagnosis not present

## 2020-03-22 DIAGNOSIS — E663 Overweight: Secondary | ICD-10-CM | POA: Diagnosis not present

## 2020-03-23 ENCOUNTER — Other Ambulatory Visit: Payer: Self-pay | Admitting: Neurological Surgery

## 2020-03-23 DIAGNOSIS — S129XXA Fracture of neck, unspecified, initial encounter: Secondary | ICD-10-CM | POA: Diagnosis not present

## 2020-03-23 DIAGNOSIS — I7774 Dissection of vertebral artery: Secondary | ICD-10-CM | POA: Diagnosis not present

## 2020-03-28 ENCOUNTER — Other Ambulatory Visit: Payer: Self-pay | Admitting: Neurological Surgery

## 2020-03-28 DIAGNOSIS — I7774 Dissection of vertebral artery: Secondary | ICD-10-CM

## 2020-04-04 ENCOUNTER — Encounter (HOSPITAL_COMMUNITY): Payer: Self-pay | Admitting: Physical Therapy

## 2020-04-04 ENCOUNTER — Ambulatory Visit (HOSPITAL_COMMUNITY): Payer: Medicare HMO | Attending: Neurological Surgery | Admitting: Physical Therapy

## 2020-04-04 ENCOUNTER — Other Ambulatory Visit: Payer: Self-pay

## 2020-04-04 DIAGNOSIS — R293 Abnormal posture: Secondary | ICD-10-CM | POA: Insufficient documentation

## 2020-04-04 DIAGNOSIS — M542 Cervicalgia: Secondary | ICD-10-CM

## 2020-04-04 DIAGNOSIS — R2689 Other abnormalities of gait and mobility: Secondary | ICD-10-CM | POA: Insufficient documentation

## 2020-04-04 DIAGNOSIS — R29898 Other symptoms and signs involving the musculoskeletal system: Secondary | ICD-10-CM | POA: Diagnosis not present
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
## 2020-04-05 DIAGNOSIS — E063 Autoimmune thyroiditis: Secondary | ICD-10-CM | POA: Diagnosis not present

## 2020-04-05 DIAGNOSIS — Z1389 Encounter for screening for other disorder: Secondary | ICD-10-CM | POA: Diagnosis not present

## 2020-04-05 DIAGNOSIS — S129XXA Fracture of neck, unspecified, initial encounter: Secondary | ICD-10-CM | POA: Diagnosis not present

## 2020-04-06 ENCOUNTER — Other Ambulatory Visit: Payer: Self-pay

## 2020-04-06 ENCOUNTER — Ambulatory Visit (HOSPITAL_COMMUNITY): Payer: Medicare HMO | Admitting: Physical Therapy

## 2020-04-06 ENCOUNTER — Encounter (HOSPITAL_COMMUNITY): Payer: Self-pay | Admitting: Physical Therapy

## 2020-04-06 DIAGNOSIS — R293 Abnormal posture: Secondary | ICD-10-CM | POA: Diagnosis not present

## 2020-04-06 DIAGNOSIS — M542 Cervicalgia: Secondary | ICD-10-CM

## 2020-04-06 DIAGNOSIS — R2689 Other abnormalities of gait and mobility: Secondary | ICD-10-CM | POA: Diagnosis not present

## 2020-04-06 DIAGNOSIS — R29898 Other symptoms and signs involving the musculoskeletal system: Secondary | ICD-10-CM

## 2020-04-06 NOTE — Patient Instructions (Signed)
Access Code: [email or web address] URL: [email or web address] Date: 04/06/2020 Prepared by: Josue Hector  Exercises Seated Cervical Rotation AROM - 2 x daily - 7 x weekly - 1 sets - 10 reps Seated Cervical Sidebending AROM - 2 x daily - 7 x weekly - 1 sets - 10 reps Seated Cervical Extension AROM - 2 x daily - 7 x weekly - 1 sets - 10 reps Seated Cervical Retraction - 2 x daily - 7 x weekly - 1 sets - 10 reps - 3 sec hold
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
## 2020-04-11 ENCOUNTER — Encounter (HOSPITAL_COMMUNITY): Payer: Self-pay | Admitting: Physical Therapy

## 2020-04-11 ENCOUNTER — Ambulatory Visit (HOSPITAL_COMMUNITY): Payer: Medicare HMO | Admitting: Physical Therapy

## 2020-04-11 ENCOUNTER — Other Ambulatory Visit: Payer: Self-pay

## 2020-04-11 DIAGNOSIS — M542 Cervicalgia: Secondary | ICD-10-CM

## 2020-04-11 DIAGNOSIS — R29898 Other symptoms and signs involving the musculoskeletal system: Secondary | ICD-10-CM | POA: Diagnosis not present

## 2020-04-11 DIAGNOSIS — R293 Abnormal posture: Secondary | ICD-10-CM

## 2020-04-11 DIAGNOSIS — R2689 Other abnormalities of gait and mobility: Secondary | ICD-10-CM | POA: Diagnosis not present
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
## 2020-04-11 NOTE — Patient Instructions (Signed)
Access Code: [email or web address] URL: [email or web address] Date: 04/11/2020 Prepared by: Mitzi Hansen Shauntay Brunelli  Exercises Supine Chin Tuck - 1 x daily - 7 x weekly - 2 sets - 10 reps

## 2020-04-13 ENCOUNTER — Encounter (HOSPITAL_COMMUNITY): Payer: Self-pay | Admitting: Physical Therapy

## 2020-04-13 ENCOUNTER — Other Ambulatory Visit: Payer: Self-pay

## 2020-04-13 ENCOUNTER — Ambulatory Visit
Admission: RE | Admit: 2020-04-13 | Discharge: 2020-04-13 | Disposition: A | Discharge status: Home / Self Care | Payer: Medicare HMO | Source: Ambulatory Visit | Attending: Neurological Surgery | Admitting: Neurological Surgery

## 2020-04-13 ENCOUNTER — Ambulatory Visit (HOSPITAL_COMMUNITY): Payer: Medicare HMO | Admitting: Physical Therapy

## 2020-04-13 DIAGNOSIS — I6501 Occlusion and stenosis of right vertebral artery: Secondary | ICD-10-CM | POA: Diagnosis not present

## 2020-04-13 DIAGNOSIS — M542 Cervicalgia: Secondary | ICD-10-CM | POA: Diagnosis not present

## 2020-04-13 DIAGNOSIS — R29898 Other symptoms and signs involving the musculoskeletal system: Secondary | ICD-10-CM | POA: Diagnosis not present

## 2020-04-13 DIAGNOSIS — R2689 Other abnormalities of gait and mobility: Secondary | ICD-10-CM

## 2020-04-13 DIAGNOSIS — I7774 Dissection of vertebral artery: Secondary | ICD-10-CM

## 2020-04-13 DIAGNOSIS — R293 Abnormal posture: Secondary | ICD-10-CM | POA: Diagnosis not present

## 2020-04-13 MED ORDER — IOPAMIDOL (ISOVUE-370) INJECTION 76%
75.0000 mL | Freq: Once | INTRAVENOUS | Status: AC | PRN
  Administered 2020-04-13: 75 mL via INTRAVENOUS
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
## 2020-04-18 ENCOUNTER — Other Ambulatory Visit: Payer: Self-pay

## 2020-04-18 ENCOUNTER — Ambulatory Visit (HOSPITAL_COMMUNITY): Payer: Medicare HMO | Admitting: Physical Therapy

## 2020-04-18 ENCOUNTER — Encounter (HOSPITAL_COMMUNITY): Payer: Self-pay | Admitting: Physical Therapy

## 2020-04-18 DIAGNOSIS — R29898 Other symptoms and signs involving the musculoskeletal system: Secondary | ICD-10-CM

## 2020-04-18 DIAGNOSIS — M542 Cervicalgia: Secondary | ICD-10-CM

## 2020-04-18 DIAGNOSIS — R293 Abnormal posture: Secondary | ICD-10-CM

## 2020-04-18 DIAGNOSIS — R2689 Other abnormalities of gait and mobility: Secondary | ICD-10-CM
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
## 2020-04-18 NOTE — Patient Instructions (Signed)
Access Code: [email or web address] URL: [email or web address] Date: 04/18/2020 Prepared by: Josue Hector  Exercises Standing Isometric Cervical Sidebending with Manual Resistance - 2 x daily - 7 x weekly - 1 sets - 5 reps - 5 hold Seated Isometric Cervical Rotation - 2 x daily - 7 x weekly - 1 sets - 5 reps - 5 hold

## 2020-04-20 ENCOUNTER — Ambulatory Visit (HOSPITAL_COMMUNITY): Payer: Medicare HMO | Attending: Neurological Surgery | Admitting: Physical Therapy

## 2020-04-20 ENCOUNTER — Encounter (HOSPITAL_COMMUNITY): Payer: Self-pay | Admitting: Physical Therapy

## 2020-04-20 ENCOUNTER — Other Ambulatory Visit: Payer: Self-pay

## 2020-04-20 DIAGNOSIS — R2689 Other abnormalities of gait and mobility: Secondary | ICD-10-CM | POA: Insufficient documentation

## 2020-04-20 DIAGNOSIS — M542 Cervicalgia: Secondary | ICD-10-CM

## 2020-04-20 DIAGNOSIS — I7774 Dissection of vertebral artery: Secondary | ICD-10-CM | POA: Diagnosis not present

## 2020-04-20 DIAGNOSIS — R29898 Other symptoms and signs involving the musculoskeletal system: Secondary | ICD-10-CM | POA: Insufficient documentation

## 2020-04-20 DIAGNOSIS — R293 Abnormal posture: Secondary | ICD-10-CM | POA: Diagnosis not present

## 2020-04-20 NOTE — Patient Instructions (Signed)
Access Code: [email or web address] URL: [email or web address] Date: 04/20/2020 Prepared by: [facility] Tiras Bianchini  Exercises Seated Thoracic Lumbar Extension with Pectoralis Stretch - 1 x daily - 7 x weekly - 1 sets - 10 reps - 5 second hold
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
## 2020-04-25 ENCOUNTER — Ambulatory Visit (HOSPITAL_COMMUNITY): Payer: Medicare HMO | Admitting: Physical Therapy

## 2020-04-25 ENCOUNTER — Other Ambulatory Visit: Payer: Self-pay

## 2020-04-25 ENCOUNTER — Encounter (HOSPITAL_COMMUNITY): Payer: Self-pay | Admitting: Physical Therapy

## 2020-04-25 DIAGNOSIS — R293 Abnormal posture: Secondary | ICD-10-CM

## 2020-04-25 DIAGNOSIS — R29898 Other symptoms and signs involving the musculoskeletal system: Secondary | ICD-10-CM

## 2020-04-25 DIAGNOSIS — M542 Cervicalgia: Secondary | ICD-10-CM

## 2020-04-25 DIAGNOSIS — R2689 Other abnormalities of gait and mobility: Secondary | ICD-10-CM | POA: Diagnosis not present
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
## 2020-04-27 ENCOUNTER — Encounter (HOSPITAL_COMMUNITY): Payer: Self-pay | Admitting: Physical Therapy

## 2020-04-27 ENCOUNTER — Ambulatory Visit (HOSPITAL_COMMUNITY): Payer: Medicare HMO | Admitting: Physical Therapy

## 2020-04-27 ENCOUNTER — Other Ambulatory Visit: Payer: Self-pay

## 2020-04-27 DIAGNOSIS — M542 Cervicalgia: Secondary | ICD-10-CM

## 2020-04-27 DIAGNOSIS — R293 Abnormal posture: Secondary | ICD-10-CM | POA: Diagnosis not present

## 2020-04-27 DIAGNOSIS — R29898 Other symptoms and signs involving the musculoskeletal system: Secondary | ICD-10-CM

## 2020-04-27 DIAGNOSIS — R2689 Other abnormalities of gait and mobility: Secondary | ICD-10-CM | POA: Diagnosis not present
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
## 2020-05-02 ENCOUNTER — Encounter (HOSPITAL_COMMUNITY): Payer: Medicare HMO | Admitting: Physical Therapy

## 2020-05-04 ENCOUNTER — Encounter (HOSPITAL_COMMUNITY): Payer: Medicare HMO

## 2020-05-22 DIAGNOSIS — E663 Overweight: Secondary | ICD-10-CM | POA: Diagnosis not present

## 2020-05-22 DIAGNOSIS — Z6827 Body mass index (BMI) 27.0-27.9, adult: Secondary | ICD-10-CM | POA: Diagnosis not present

## 2020-05-22 DIAGNOSIS — G4489 Other headache syndrome: Secondary | ICD-10-CM | POA: Diagnosis not present

## 2020-05-22 DIAGNOSIS — I10 Essential (primary) hypertension: Secondary | ICD-10-CM | POA: Diagnosis not present

## 2020-05-22 DIAGNOSIS — R202 Paresthesia of skin: Secondary | ICD-10-CM | POA: Diagnosis not present

## 2020-05-22 DIAGNOSIS — Z79899 Other long term (current) drug therapy: Secondary | ICD-10-CM | POA: Diagnosis not present

## 2020-05-22 DIAGNOSIS — S12100A Unspecified displaced fracture of second cervical vertebra, initial encounter for closed fracture: Secondary | ICD-10-CM | POA: Diagnosis not present

## 2020-05-22 DIAGNOSIS — E782 Mixed hyperlipidemia: Secondary | ICD-10-CM | POA: Diagnosis not present

## 2020-05-22 DIAGNOSIS — E063 Autoimmune thyroiditis: Secondary | ICD-10-CM | POA: Diagnosis not present

## 2020-05-22 DIAGNOSIS — Z1389 Encounter for screening for other disorder: Secondary | ICD-10-CM | POA: Diagnosis not present

## 2020-05-22 DIAGNOSIS — E7849 Other hyperlipidemia: Secondary | ICD-10-CM | POA: Diagnosis not present

## 2020-06-01 DIAGNOSIS — S129XXA Fracture of neck, unspecified, initial encounter: Secondary | ICD-10-CM | POA: Diagnosis not present

## 2020-07-17 DIAGNOSIS — S129XXD Fracture of neck, unspecified, subsequent encounter: Secondary | ICD-10-CM | POA: Diagnosis not present

## 2020-07-17 DIAGNOSIS — M5481 Occipital neuralgia: Secondary | ICD-10-CM | POA: Diagnosis not present

## 2020-07-17 DIAGNOSIS — I10 Essential (primary) hypertension: Secondary | ICD-10-CM | POA: Diagnosis not present

## 2020-07-17 DIAGNOSIS — Z23 Encounter for immunization: Secondary | ICD-10-CM | POA: Diagnosis not present

## 2020-07-17 DIAGNOSIS — G4700 Insomnia, unspecified: Secondary | ICD-10-CM | POA: Diagnosis not present

## 2020-07-17 DIAGNOSIS — Z6827 Body mass index (BMI) 27.0-27.9, adult: Secondary | ICD-10-CM | POA: Diagnosis not present

## 2020-07-20 ENCOUNTER — Other Ambulatory Visit: Payer: Self-pay

## 2020-07-20 ENCOUNTER — Ambulatory Visit (HOSPITAL_COMMUNITY): Payer: Medicare HMO | Attending: Neurological Surgery | Admitting: Physical Therapy

## 2020-07-20 DIAGNOSIS — M542 Cervicalgia: Secondary | ICD-10-CM | POA: Insufficient documentation

## 2020-07-20 DIAGNOSIS — R293 Abnormal posture: Secondary | ICD-10-CM | POA: Insufficient documentation
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
## 2020-07-25 ENCOUNTER — Other Ambulatory Visit: Payer: Self-pay

## 2020-07-25 ENCOUNTER — Ambulatory Visit (HOSPITAL_COMMUNITY): Payer: Medicare HMO | Attending: Neurological Surgery | Admitting: Physical Therapy

## 2020-07-25 ENCOUNTER — Encounter (HOSPITAL_COMMUNITY): Payer: Self-pay | Admitting: Physical Therapy

## 2020-07-25 DIAGNOSIS — R2689 Other abnormalities of gait and mobility: Secondary | ICD-10-CM | POA: Diagnosis not present

## 2020-07-25 DIAGNOSIS — R293 Abnormal posture: Secondary | ICD-10-CM | POA: Insufficient documentation

## 2020-07-25 DIAGNOSIS — R29898 Other symptoms and signs involving the musculoskeletal system: Secondary | ICD-10-CM | POA: Insufficient documentation

## 2020-07-25 DIAGNOSIS — M542 Cervicalgia: Secondary | ICD-10-CM | POA: Diagnosis not present
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
## 2020-07-27 ENCOUNTER — Other Ambulatory Visit: Payer: Self-pay

## 2020-07-27 ENCOUNTER — Encounter (HOSPITAL_COMMUNITY): Payer: Self-pay | Admitting: Physical Therapy

## 2020-07-27 ENCOUNTER — Ambulatory Visit (HOSPITAL_COMMUNITY): Payer: Medicare HMO | Admitting: Physical Therapy

## 2020-07-27 DIAGNOSIS — M542 Cervicalgia: Secondary | ICD-10-CM

## 2020-07-27 DIAGNOSIS — R293 Abnormal posture: Secondary | ICD-10-CM | POA: Diagnosis not present

## 2020-07-27 DIAGNOSIS — R69 Illness, unspecified: Secondary | ICD-10-CM | POA: Diagnosis not present

## 2020-07-27 DIAGNOSIS — R2689 Other abnormalities of gait and mobility: Secondary | ICD-10-CM | POA: Diagnosis not present

## 2020-07-27 DIAGNOSIS — R29898 Other symptoms and signs involving the musculoskeletal system: Secondary | ICD-10-CM | POA: Diagnosis not present
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
## 2020-07-28 ENCOUNTER — Encounter: Payer: Self-pay | Admitting: Cardiovascular Disease

## 2020-07-28 ENCOUNTER — Other Ambulatory Visit: Payer: Self-pay

## 2020-07-28 ENCOUNTER — Ambulatory Visit: Payer: Medicare HMO | Admitting: Cardiovascular Disease

## 2020-07-28 DIAGNOSIS — I10 Essential (primary) hypertension: Secondary | ICD-10-CM | POA: Diagnosis not present

## 2020-07-28 NOTE — Assessment & Plan Note (Signed)
History of essential hypertension blood pressure measured today 130/70.  She is on amlodipine and benazepril.

## 2020-07-28 NOTE — Patient Instructions (Signed)
  Follow-Up: At [facility], you and your health needs are our priority.  As part of our continuing mission to provide you with exceptional heart care, we have created designated Provider Care Teams.  These Care Teams include your primary Cardiologist (physician) and Advanced Practice Providers (APPs -  Physician Assistants and Nurse Practitioners) who all work together to provide you with the care you need, when you need it.  We recommend signing up for the patient portal called "MyChart".  Sign up information is provided on this After Visit Summary.  MyChart is used to connect with patients for Virtual Visits (Telemedicine).  Patients are able to view lab/test results, encounter notes, upcoming appointments, etc.  Non-urgent messages can be sent to your provider as well.   To learn more about what you can do with MyChart, go to [email or web address].    Your next appointment:   12 month(s)  The format for your next appointment:   In Person  Provider:   You may see Quay Burow MD or one of the following Advanced Practice Providers on your designated Care Team:    Kerin Ransom, PA-C  [place], [place]  Coletta Memos, [employer]

## 2020-07-28 NOTE — Assessment & Plan Note (Signed)
History of dyslipidemia on statin therapy with lipid profile performed 05/22/2020 revealing total cholesterol 172, LDL 102 and HDL 51.

## 2020-07-28 NOTE — Progress Notes (Signed)
07/28/2020 Kaitlyn Baker   1941/07/13  [id number]  Primary Physician Redmond School, MD Primary Cardiologist: Lorretta Harp MD [name], [name]  HPI:  Kaitlyn Baker is a 79 y.o.  mildly overweight married Caucasian female, mother of 46, whose husbandJohnis also a patient of mine. I last saw her  08/11/2018. She has a history of hyperlipidemia, treated hypertension, and family history of heart disease. She was complaining of some atypical chest pain radiating to her back, which is a new finding for her 2 years ago. She had a Myoview stress test performed on May 28, 2012, which was entirely normal, and subsequent to that her symptoms completely resolved.  Since I saw her 2 years ago she is remained essentially asymptomatic.    She did have an episode of shortness of breath last summer which ultimately resolved spontaneously and turned out not to be anything serious.  She did spend time in [place] with her randdaughter several years ago touring [place], [place] in [place].  Her granddaughter graduated college and now is considering going to medical school.   Over the last year she has had no symptoms.  She denies chest pain or shortness of breath.  Her most recent lipid profile performed 05/22/2020 revealed LDL of 102.    Current Meds  Medication Sig  . amLODipine-benazepril (LOTREL) 10-20 MG per capsule Take 1 capsule by mouth daily.  Marland Kitchen aspirin 81 MG tablet Take 81 mg by mouth daily.  . diazepam (VALIUM) 5 MG tablet Take 5 mg by mouth 3 (three) times daily as needed.  . gabapentin (NEURONTIN) 100 MG capsule Take 100 mg by mouth 3 (three) times daily.  Marland Kitchen ibuprofen (ADVIL,MOTRIN) 600 MG tablet Take 600 mg by mouth every 6 (six) hours as needed for moderate pain.   Marland Kitchen levothyroxine (SYNTHROID, LEVOTHROID) 50 MCG tablet Take 1 tablet by mouth daily.  . simvastatin (ZOCOR) 20 MG tablet Take 1 tablet by mouth daily.     Allergies  Allergen Reactions   . Methylprednisolone Rash and Anaphylaxis    Severe rash and edema-tongue and vocal cords  . Morphine Anaphylaxis  . Demerol [Meperidine] Swelling    Patient developed tongue swelling. He was treated with Benadryl and prednisone by PCP and is fine  . Versed [Midazolam]   . Moviprep [Peg-Kcl-Nacl-Nasulf-Na Asc-C] Itching    Severe itching all over, itching in the back of the throat, and lip swelling    Social History   Socioeconomic History  . Marital status: Married    Spouse name: Not on file  . Number of children: Not on file  . Years of education: Not on file  . Highest education level: Not on file  Occupational History  . Not on file  Tobacco Use  . Smoking status: Never Smoker  . Smokeless tobacco: Never Used  Substance and Sexual Activity  . Alcohol use: Yes    Comment: occasionally  . Drug use: No  . Sexual activity: Not on file  Other Topics Concern  . Not on file  Social History Narrative   Retired Therapist, sports   Social Determinants of Radio broadcast assistant Strain:   . Difficulty of Paying Living Expenses: Not on file  Food Insecurity:   . Worried About Charity fundraiser in the Last Year: Not on file  . Ran Out of Food in the Last Year: Not on file  Transportation Needs:   . Lack of Transportation (Medical): Not on  file  . Lack of Transportation (Non-Medical): Not on file  Physical Activity:   . Days of Exercise per Week: Not on file  . Minutes of Exercise per Session: Not on file  Stress:   . Feeling of Stress : Not on file  Social Connections:   . Frequency of Communication with Friends and Family: Not on file  . Frequency of Social Gatherings with Friends and Family: Not on file  . Attends Religious Services: Not on file  . Active Member of Clubs or Organizations: Not on file  . Attends Archivist Meetings: Not on file  . Marital Status: Not on file  Intimate Partner Violence:   . Fear of Current or Ex-Partner: Not on file  . Emotionally  Abused: Not on file  . Physically Abused: Not on file  . Sexually Abused: Not on file     Review of Systems: General: negative for chills, fever, night sweats or weight changes.  Cardiovascular: negative for chest pain, dyspnea on exertion, edema, orthopnea, palpitations, paroxysmal nocturnal dyspnea or shortness of breath Dermatological: negative for rash Respiratory: negative for cough or wheezing Urologic: negative for hematuria Abdominal: negative for nausea, vomiting, diarrhea, bright red blood per rectum, melena, or hematemesis Neurologic: negative for visual changes, syncope, or dizziness All other systems reviewed and are otherwise negative except as noted above.    Blood pressure 130/70, pulse 72, height 5\' 6"  (1.676 m), weight 180 lb 6.4 oz (81.8 kg), SpO2 100 %.  General appearance: alert and no distress Neck: no adenopathy, no carotid bruit, no JVD, supple, symmetrical, trachea midline and thyroid not enlarged, symmetric, no tenderness/mass/nodules Lungs: clear to auscultation bilaterally Heart: regular rate and rhythm, S1, S2 normal, no murmur, click, rub or gallop Extremities: extremities normal, atraumatic, no cyanosis or edema Pulses: 2+ and symmetric Skin: Skin color, texture, turgor normal. No rashes or lesions Neurologic: Alert and oriented X 3, normal strength and tone. Normal symmetric reflexes. Normal coordination and gait  EKG sinus rhythm at 72 without ST or T wave changes.  Personally reviewed this EKG.  ASSESSMENT AND PLAN:   HTN (hypertension) History of essential hypertension blood pressure measured today 130/70.  She is on amlodipine and benazepril.  Dyslipidemia History of dyslipidemia on statin therapy with lipid profile performed 05/22/2020 revealing total cholesterol 172, LDL 102 and HDL 51.      Lorretta Harp MD FACP,FACC,FAHA, [facility] 07/28/2020 9:24 AM

## 2020-08-01 ENCOUNTER — Encounter (HOSPITAL_COMMUNITY): Payer: Self-pay | Admitting: Physical Therapy

## 2020-08-01 ENCOUNTER — Other Ambulatory Visit: Payer: Self-pay

## 2020-08-01 ENCOUNTER — Ambulatory Visit (HOSPITAL_COMMUNITY): Payer: Medicare HMO | Admitting: Physical Therapy

## 2020-08-01 DIAGNOSIS — R2689 Other abnormalities of gait and mobility: Secondary | ICD-10-CM | POA: Diagnosis not present

## 2020-08-01 DIAGNOSIS — M542 Cervicalgia: Secondary | ICD-10-CM

## 2020-08-01 DIAGNOSIS — R29898 Other symptoms and signs involving the musculoskeletal system: Secondary | ICD-10-CM | POA: Diagnosis not present

## 2020-08-01 DIAGNOSIS — R293 Abnormal posture: Secondary | ICD-10-CM
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
## 2020-08-02 DIAGNOSIS — H04123 Dry eye syndrome of bilateral lacrimal glands: Secondary | ICD-10-CM | POA: Diagnosis not present

## 2020-08-02 DIAGNOSIS — H40013 Open angle with borderline findings, low risk, bilateral: Secondary | ICD-10-CM | POA: Diagnosis not present

## 2020-08-02 DIAGNOSIS — H524 Presbyopia: Secondary | ICD-10-CM | POA: Diagnosis not present

## 2020-08-02 DIAGNOSIS — H25813 Combined forms of age-related cataract, bilateral: Secondary | ICD-10-CM | POA: Diagnosis not present

## 2020-08-03 ENCOUNTER — Encounter (HOSPITAL_COMMUNITY): Payer: Self-pay | Admitting: Physical Therapy

## 2020-08-03 ENCOUNTER — Ambulatory Visit (HOSPITAL_COMMUNITY): Payer: Medicare HMO | Admitting: Physical Therapy

## 2020-08-03 ENCOUNTER — Other Ambulatory Visit: Payer: Self-pay

## 2020-08-03 DIAGNOSIS — R2689 Other abnormalities of gait and mobility: Secondary | ICD-10-CM | POA: Diagnosis not present

## 2020-08-03 DIAGNOSIS — M542 Cervicalgia: Secondary | ICD-10-CM

## 2020-08-03 DIAGNOSIS — R293 Abnormal posture: Secondary | ICD-10-CM

## 2020-08-03 DIAGNOSIS — R29898 Other symptoms and signs involving the musculoskeletal system: Secondary | ICD-10-CM | POA: Diagnosis not present
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
## 2020-08-08 ENCOUNTER — Encounter (HOSPITAL_COMMUNITY): Payer: Self-pay | Admitting: Physical Therapy

## 2020-08-08 ENCOUNTER — Ambulatory Visit (HOSPITAL_COMMUNITY): Payer: Medicare HMO | Admitting: Physical Therapy

## 2020-08-08 ENCOUNTER — Other Ambulatory Visit: Payer: Self-pay

## 2020-08-08 DIAGNOSIS — R293 Abnormal posture: Secondary | ICD-10-CM | POA: Diagnosis not present

## 2020-08-08 DIAGNOSIS — R2689 Other abnormalities of gait and mobility: Secondary | ICD-10-CM | POA: Diagnosis not present

## 2020-08-08 DIAGNOSIS — M542 Cervicalgia: Secondary | ICD-10-CM | POA: Diagnosis not present

## 2020-08-08 DIAGNOSIS — R29898 Other symptoms and signs involving the musculoskeletal system: Secondary | ICD-10-CM | POA: Diagnosis not present
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
## 2020-08-10 ENCOUNTER — Ambulatory Visit (HOSPITAL_COMMUNITY): Payer: Medicare HMO | Admitting: Physical Therapy

## 2020-08-15 ENCOUNTER — Encounter (HOSPITAL_COMMUNITY): Payer: Medicare HMO | Admitting: Physical Therapy

## 2020-08-17 ENCOUNTER — Encounter (HOSPITAL_COMMUNITY): Payer: Medicare HMO | Admitting: Physical Therapy

## 2020-09-08 DIAGNOSIS — S129XXA Fracture of neck, unspecified, initial encounter: Secondary | ICD-10-CM | POA: Diagnosis not present

## 2020-09-27 DIAGNOSIS — S129XXA Fracture of neck, unspecified, initial encounter: Secondary | ICD-10-CM | POA: Diagnosis not present

## 2020-09-27 DIAGNOSIS — S12100A Unspecified displaced fracture of second cervical vertebra, initial encounter for closed fracture: Secondary | ICD-10-CM | POA: Diagnosis not present

## 2020-09-28 DIAGNOSIS — R030 Elevated blood-pressure reading, without diagnosis of hypertension: Secondary | ICD-10-CM | POA: Diagnosis not present

## 2020-09-28 DIAGNOSIS — M5481 Occipital neuralgia: Secondary | ICD-10-CM | POA: Diagnosis not present

## 2020-09-28 DIAGNOSIS — Z6829 Body mass index (BMI) 29.0-29.9, adult: Secondary | ICD-10-CM | POA: Diagnosis not present

## 2020-10-10 ENCOUNTER — Other Ambulatory Visit: Payer: Self-pay | Admitting: Internal Medicine

## 2020-10-10 DIAGNOSIS — Z1231 Encounter for screening mammogram for malignant neoplasm of breast: Secondary | ICD-10-CM

## 2020-10-17 DIAGNOSIS — M5481 Occipital neuralgia: Secondary | ICD-10-CM | POA: Diagnosis not present

## 2020-11-13 DIAGNOSIS — L255 Unspecified contact dermatitis due to plants, except food: Secondary | ICD-10-CM | POA: Diagnosis not present

## 2020-11-13 DIAGNOSIS — L818 Other specified disorders of pigmentation: Secondary | ICD-10-CM | POA: Diagnosis not present

## 2020-11-13 DIAGNOSIS — X32XXXD Exposure to sunlight, subsequent encounter: Secondary | ICD-10-CM | POA: Diagnosis not present

## 2020-11-13 DIAGNOSIS — L570 Actinic keratosis: Secondary | ICD-10-CM | POA: Diagnosis not present

## 2020-11-17 ENCOUNTER — Other Ambulatory Visit: Payer: Self-pay

## 2020-11-17 ENCOUNTER — Ambulatory Visit
Admission: RE | Admit: 2020-11-17 | Discharge: 2020-11-17 | Disposition: A | Discharge status: Home / Self Care | Payer: Medicare HMO | Source: Ambulatory Visit | Attending: Internal Medicine | Admitting: Internal Medicine

## 2020-11-17 DIAGNOSIS — Z1231 Encounter for screening mammogram for malignant neoplasm of breast: Secondary | ICD-10-CM

## 2020-11-20 ENCOUNTER — Ambulatory Visit: Payer: Medicare HMO

## 2020-11-29 DIAGNOSIS — R030 Elevated blood-pressure reading, without diagnosis of hypertension: Secondary | ICD-10-CM | POA: Diagnosis not present

## 2020-11-29 DIAGNOSIS — Z6829 Body mass index (BMI) 29.0-29.9, adult: Secondary | ICD-10-CM | POA: Diagnosis not present

## 2020-11-29 DIAGNOSIS — Z0000 Encounter for general adult medical examination without abnormal findings: Secondary | ICD-10-CM | POA: Diagnosis not present

## 2020-11-29 DIAGNOSIS — L209 Atopic dermatitis, unspecified: Secondary | ICD-10-CM | POA: Diagnosis not present

## 2020-11-29 DIAGNOSIS — Z1389 Encounter for screening for other disorder: Secondary | ICD-10-CM | POA: Diagnosis not present

## 2020-11-29 DIAGNOSIS — M5481 Occipital neuralgia: Secondary | ICD-10-CM | POA: Diagnosis not present

## 2020-12-07 DIAGNOSIS — N952 Postmenopausal atrophic vaginitis: Secondary | ICD-10-CM | POA: Diagnosis not present

## 2020-12-07 DIAGNOSIS — Z6829 Body mass index (BMI) 29.0-29.9, adult: Secondary | ICD-10-CM | POA: Diagnosis not present

## 2020-12-07 DIAGNOSIS — N819 Female genital prolapse, unspecified: Secondary | ICD-10-CM | POA: Diagnosis not present

## 2020-12-07 DIAGNOSIS — Z01419 Encounter for gynecological examination (general) (routine) without abnormal findings: Secondary | ICD-10-CM | POA: Diagnosis not present

## 2021-03-01 DIAGNOSIS — E7489 Other specified disorders of carbohydrate metabolism: Secondary | ICD-10-CM | POA: Diagnosis not present

## 2021-03-01 DIAGNOSIS — E782 Mixed hyperlipidemia: Secondary | ICD-10-CM | POA: Diagnosis not present

## 2021-03-01 DIAGNOSIS — Z79899 Other long term (current) drug therapy: Secondary | ICD-10-CM | POA: Diagnosis not present

## 2021-03-01 DIAGNOSIS — E559 Vitamin D deficiency, unspecified: Secondary | ICD-10-CM | POA: Diagnosis not present

## 2021-03-01 DIAGNOSIS — E785 Hyperlipidemia, unspecified: Secondary | ICD-10-CM | POA: Diagnosis not present

## 2021-03-01 DIAGNOSIS — E063 Autoimmune thyroiditis: Secondary | ICD-10-CM | POA: Diagnosis not present

## 2021-03-06 DIAGNOSIS — E7849 Other hyperlipidemia: Secondary | ICD-10-CM | POA: Diagnosis not present

## 2021-03-06 DIAGNOSIS — R0789 Other chest pain: Secondary | ICD-10-CM | POA: Diagnosis not present

## 2021-03-06 DIAGNOSIS — E063 Autoimmune thyroiditis: Secondary | ICD-10-CM | POA: Diagnosis not present

## 2021-03-06 DIAGNOSIS — Z6828 Body mass index (BMI) 28.0-28.9, adult: Secondary | ICD-10-CM | POA: Diagnosis not present

## 2021-03-06 DIAGNOSIS — E663 Overweight: Secondary | ICD-10-CM | POA: Diagnosis not present

## 2021-03-06 DIAGNOSIS — R7309 Other abnormal glucose: Secondary | ICD-10-CM | POA: Diagnosis not present

## 2021-03-06 DIAGNOSIS — I10 Essential (primary) hypertension: Secondary | ICD-10-CM | POA: Diagnosis not present

## 2021-03-06 DIAGNOSIS — Z86010 Personal history of colonic polyps: Secondary | ICD-10-CM | POA: Diagnosis not present

## 2021-03-09 ENCOUNTER — Encounter (INDEPENDENT_AMBULATORY_CARE_PROVIDER_SITE_OTHER): Payer: Self-pay | Admitting: *Deleted

## 2021-03-15 ENCOUNTER — Encounter: Payer: Self-pay | Admitting: Cardiovascular Disease

## 2021-03-15 ENCOUNTER — Other Ambulatory Visit: Payer: Self-pay

## 2021-03-15 ENCOUNTER — Ambulatory Visit (INDEPENDENT_AMBULATORY_CARE_PROVIDER_SITE_OTHER): Payer: Medicare HMO

## 2021-03-15 ENCOUNTER — Ambulatory Visit: Payer: Medicare HMO | Admitting: Cardiovascular Disease

## 2021-03-15 VITALS — BP 130/68 | HR 65 | Ht 66.0 in | Wt 179.8 lb

## 2021-03-15 DIAGNOSIS — I10 Essential (primary) hypertension: Secondary | ICD-10-CM

## 2021-03-15 DIAGNOSIS — R0989 Other specified symptoms and signs involving the circulatory and respiratory systems: Secondary | ICD-10-CM

## 2021-03-15 DIAGNOSIS — R5383 Other fatigue: Secondary | ICD-10-CM

## 2021-03-15 DIAGNOSIS — R002 Palpitations: Secondary | ICD-10-CM | POA: Diagnosis not present

## 2021-03-15 NOTE — Progress Notes (Unsigned)
Patient enrolled for [employer] to mail a 14 day ZIO XT monitor to her home. 

## 2021-03-15 NOTE — Assessment & Plan Note (Signed)
History of essential hypertension blood pressure measured today 130/68.  She is on amlodipine and benazepril.

## 2021-03-15 NOTE — Assessment & Plan Note (Signed)
History of hyperlipidemia on statin therapy lipid profile performed 05/22/2020 revealing total cholesterol 172, LDL 102 and HDL of 51.

## 2021-03-15 NOTE — Assessment & Plan Note (Signed)
We will get a 2-week Zio patch to further evaluate

## 2021-03-15 NOTE — Assessment & Plan Note (Signed)
Recently has noted fatigue while doing any activity which is unusual for her.  Her last Myoview was 10 years ago.  I am going to get a 2D echocardiogram and a Lexiscan Myoview to further evaluate

## 2021-03-15 NOTE — Progress Notes (Signed)
03/15/2021 Kaitlyn Baker   08/08/1941  [id number]  Primary Physician Redmond School, MD Primary Cardiologist: Lorretta Harp MD [name], [name]  HPI:  Kaitlyn Baker is a 80 y.o.  mildly overweight married Caucasian female, mother of 22, whose husbandJohnis also a patient of mine. I last saw her  07/28/2020. She has a history of hyperlipidemia, treated hypertension, and family history of heart disease. She was complaining of some atypical chest pain radiating to her back, which is a new finding for her 2 years ago. She had a Myoview stress test performed on May 28, 2012, which was entirely normal, and subsequent to that her symptoms completely resolved.  She had  an episode of shortness of breath last summer which ultimately resolved spontaneously and turned out not to be anything serious.  She did spend time in [place] with her randdaughter several years ago touring [place], [place] in [place].  Her granddaughter graduated [employer] with honors and now is considering going to medical school.  Since I saw her 6 months ago she has noticed increasing fatigue with any activity as well as occasional palpitations in her chest which is unusual for her.  She specifically denies chest pain or shortness of breath but is very concerned about the new symptoms of fatigue.   Current Meds  Medication Sig  . amLODipine-benazepril (LOTREL) 10-20 MG per capsule Take 1 capsule by mouth daily.  Marland Kitchen aspirin 81 MG tablet Take 81 mg by mouth daily.  . diazepam (VALIUM) 5 MG tablet Take 5 mg by mouth 3 (three) times daily as needed.  Marland Kitchen ibuprofen (ADVIL,MOTRIN) 600 MG tablet Take 600 mg by mouth every 6 (six) hours as needed for moderate pain.   Marland Kitchen levothyroxine (SYNTHROID, LEVOTHROID) 50 MCG tablet Take 1 tablet by mouth daily.  . simvastatin (ZOCOR) 20 MG tablet Take 1 tablet by mouth daily.     Allergies  Allergen Reactions  . Methylprednisolone Anaphylaxis  and Rash    Injection medication-Severe rash and edema-tongue and vocal cords  . Methylprednisolone Acetate Swelling    Injection medication-Severe swelling of tongue and vocal cords, and rash  . Morphine Anaphylaxis  . Demerol [Meperidine] Swelling    Patient developed tongue swelling. He was treated with Benadryl and prednisone by PCP and is fine  . Versed [Midazolam]   . Moviprep [Peg-Kcl-Nacl-Nasulf-Na Asc-C] Itching    Severe itching all over, itching in the back of the throat, and lip swelling    Social History   Socioeconomic History  . Marital status: Married    Spouse name: Not on file  . Number of children: Not on file  . Years of education: Not on file  . Highest education level: Not on file  Occupational History  . Not on file  Tobacco Use  . Smoking status: Never Smoker  . Smokeless tobacco: Never Used  Substance and Sexual Activity  . Alcohol use: Yes    Comment: occasionally  . Drug use: No  . Sexual activity: Not on file  Other Topics Concern  . Not on file  Social History Narrative   Retired Therapist, sports   Social Determinants of Radio broadcast assistant Strain: Not on [employer] Insecurity: Not on file  Transportation Needs: Not on file  Physical Activity: Not on file  Stress: Not on file  Social Connections: Not on file  Intimate Partner Violence: Not on file     Review of Systems: General:  negative for chills, fever, night sweats or weight changes.  Cardiovascular: negative for chest pain, dyspnea on exertion, edema, orthopnea, palpitations, paroxysmal nocturnal dyspnea or shortness of breath Dermatological: negative for rash Respiratory: negative for cough or wheezing Urologic: negative for hematuria Abdominal: negative for nausea, vomiting, diarrhea, bright red blood per rectum, melena, or hematemesis Neurologic: negative for visual changes, syncope, or dizziness All other systems reviewed and are otherwise negative except as noted  above.    Blood pressure 130/68, pulse 65, height 5\' 6"  (1.676 m), weight 179 lb 12.8 oz (81.6 kg).  General appearance: alert and no distress Neck: no adenopathy, no JVD, supple, symmetrical, trachea midline, thyroid not enlarged, symmetric, no tenderness/mass/nodules and Left carotid bruit Lungs: clear to auscultation bilaterally Heart: regular rate and rhythm, S1, S2 normal, no murmur, click, rub or gallop Extremities: extremities normal, atraumatic, no cyanosis or edema Pulses: 2+ and symmetric Skin: Skin color, texture, turgor normal. No rashes or lesions Neurologic: Alert and oriented X 3, normal strength and tone. Normal symmetric reflexes. Normal coordination and gait  EKG sinus rhythm at 65 without ST or T wave changes.  I personally reviewed this EKG.  ASSESSMENT AND PLAN:   Palpitations We will get a 2-week Zio patch to further evaluate  Fatigue Recently has noted fatigue while doing any activity which is unusual for her.  Her last Myoview was 10 years ago.  I am going to get a 2D echocardiogram and a Lexiscan Myoview to further evaluate  HTN (hypertension) History of essential hypertension blood pressure measured today 130/68.  She is on amlodipine and benazepril.  Dyslipidemia History of hyperlipidemia on statin therapy lipid profile performed 05/22/2020 revealing total cholesterol 172, LDL 102 and HDL of 51.      Lorretta Harp MD [place], [facility] 03/15/2021 10:27 AM

## 2021-03-15 NOTE — Patient Instructions (Signed)
Medication Instructions:  Your physician recommends that you continue on your current medications as directed. Please refer to the Current Medication list given to you today.  *If you need a refill on your cardiac medications before your next appointment, please call your pharmacy*   Testing/Procedures: Your physician has requested that you have an echocardiogram. Echocardiography is a painless test that uses sound waves to create images of your heart. It provides your doctor with information about the size and shape of your heart and how well your heart's chambers and valves are working. This procedure takes approximately one hour. There are no restrictions for this procedure. This procedure is done at [address] physician has requested that you have a carotid duplex. This test is an ultrasound of the carotid arteries in your neck. It looks at blood flow through these arteries that supply the brain with blood. Allow one hour for this exam. There are no restrictions or special instructions.  This procedure is done at [facility]. 2nd Floor.   Your physician has requested that you have a lexiscan myoview. For further information please visit [email or web address]. Please follow instruction sheet, as given. This will take place at [place], [address]  How to prepare for your Myocardial Perfusion Test:  Do not eat or drink 3 hours prior to your test, except you may have water.  Do not consume products containing caffeine (regular or decaffeinated) 12 hours prior to your test. (ex: coffee, chocolate, sodas, tea).  Do bring a list of your current medications with you.  If not listed below, you may take your medications as normal.  Do wear comfortable clothes (no dresses or overalls) and walking shoes, tennis shoes preferred (No heels or open toe shoes are allowed).  Do NOT wear cologne, perfume, aftershave, or lotions (deodorant is allowed).  The test will  take approximately 3 to 4 hours to complete  If these instructions are not followed, your test will have to be rescheduled.   ZIO XT- Long Term Monitor Instructions   Your physician has requested you wear a ZIO patch monitor for 14 days.  This is a single patch monitor.   [employer] supplies one patch monitor per enrollment. Additional stickers are not available. Please do not apply patch if you will be having a Nuclear Stress Test, Echocardiogram, Cardiac CT, MRI, or Chest Xray during the period you would be wearing the monitor. The patch cannot be worn during these tests. You cannot remove and re-apply the ZIO XT patch monitor.  Your ZIO patch monitor will be sent Fed Ex from [employer] directly to your home address. It may take 3-5 days to receive your monitor after you have been enrolled.  Once you have received your monitor, please review the enclosed instructions. Your monitor has already been registered assigning a specific monitor serial # to you.  Billing and Patient Assistance Program Information   We have supplied [employer] with any of your insurance information on file for billing purposes. [employer] offers a sliding scale Patient Assistance Program for patients that do not have insurance, or whose insurance does not completely cover the cost of the ZIO monitor.   You must apply for the Patient Assistance Program to qualify for this discounted rate.     To apply, please call [employer] at [phone], select option 4, then select option 2, and ask to apply for Patient Assistance Program.  Theodore Demark will ask your household income, and how many  people are in your household.  They will quote your out-of-pocket cost based on that information.  [employer] will also be able to set up a 22-month, interest-free payment plan if needed.  Applying the monitor   Shave hair from upper left chest.  Hold abrader disc by orange tab. Rub abrader in 40 strokes over the upper left chest as indicated in your  monitor instructions.  Clean area with 4 enclosed alcohol pads. Let dry.  Apply patch as indicated in monitor instructions. Patch will be placed under collarbone on left side of chest with arrow pointing upward.  Rub patch adhesive wings for 2 minutes. Remove white label marked "1". Remove the white label marked "2". Rub patch adhesive wings for 2 additional minutes.  While looking in a mirror, press and release button in center of patch. A small green light will flash 3-4 times. This will be your only indicator that the monitor has been turned on. ?  Do not shower for the first 24 hours. You may shower after the first 24 hours.  Press the button if you feel a symptom. You will hear a small click. Record Date, Time and Symptom in the Patient Logbook.  When you are ready to remove the patch, follow instructions on the last 2 pages of the Patient Logbook. Stick patch monitor onto the last page of Patient Logbook.  Place Patient Logbook in the blue and white box.  Use locking tab on box and tape box closed securely.  The blue and white box has prepaid postage on it. Please place it in the mailbox as soon as possible. Your physician should have your test results approximately 7 days after the monitor has been mailed back to [facility].  Call [place] at [phone] if you have questions regarding your ZIO XT patch monitor. Call them immediately if you see an orange light blinking on your monitor.  If your monitor falls off in less than 4 days, contact our Monitor department at [phone]. ?If your monitor becomes loose or falls off after 4 days call [employer] at [phone] for suggestions on securing your monitor.?    Follow-Up: At [facility], you and your health needs are our priority.  As part of our continuing mission to provide you with exceptional heart care, we have created designated Provider Care Teams.  These Care Teams include your primary Cardiologist  (physician) and Advanced Practice Providers (APPs -  Physician Assistants and Nurse Practitioners) who all work together to provide you with the care you need, when you need it.  We recommend signing up for the patient portal called "MyChart".  Sign up information is provided on this After Visit Summary.  MyChart is used to connect with patients for Virtual Visits (Telemedicine).  Patients are able to view lab/test results, encounter notes, upcoming appointments, etc.  Non-urgent messages can be sent to your provider as well.   To learn more about what you can do with MyChart, go to [email or web address].    Your next appointment:   8 week(s)  The format for your next appointment:   In Person  Provider:   Quay Burow, MD

## 2021-03-18 DIAGNOSIS — R002 Palpitations: Secondary | ICD-10-CM | POA: Diagnosis not present

## 2021-03-18 DIAGNOSIS — I10 Essential (primary) hypertension: Secondary | ICD-10-CM | POA: Diagnosis not present

## 2021-03-20 DIAGNOSIS — L905 Scar conditions and fibrosis of skin: Secondary | ICD-10-CM | POA: Diagnosis not present

## 2021-03-20 DIAGNOSIS — Z1283 Encounter for screening for malignant neoplasm of skin: Secondary | ICD-10-CM | POA: Diagnosis not present

## 2021-03-20 DIAGNOSIS — D225 Melanocytic nevi of trunk: Secondary | ICD-10-CM | POA: Diagnosis not present

## 2021-03-23 ENCOUNTER — Telehealth (HOSPITAL_COMMUNITY): Payer: Self-pay | Admitting: *Deleted

## 2021-03-23 DIAGNOSIS — D72829 Elevated white blood cell count, unspecified: Secondary | ICD-10-CM | POA: Diagnosis not present

## 2021-03-23 NOTE — Telephone Encounter (Signed)
Close encounter 

## 2021-03-26 ENCOUNTER — Ambulatory Visit (HOSPITAL_COMMUNITY)
Admission: RE | Admit: 2021-03-26 | Discharge: 2021-03-26 | Disposition: A | Discharge status: Home / Self Care | Payer: Medicare HMO | Source: Ambulatory Visit | Attending: Cardiology | Admitting: Cardiology

## 2021-03-26 ENCOUNTER — Other Ambulatory Visit: Payer: Self-pay

## 2021-03-26 DIAGNOSIS — R0989 Other specified symptoms and signs involving the circulatory and respiratory systems: Secondary | ICD-10-CM | POA: Insufficient documentation

## 2021-03-27 ENCOUNTER — Ambulatory Visit (HOSPITAL_COMMUNITY)
Admission: RE | Admit: 2021-03-27 | Payer: Medicare HMO | Source: Ambulatory Visit | Attending: Cardiovascular Disease | Admitting: Cardiovascular Disease

## 2021-03-27 ENCOUNTER — Telehealth: Payer: Self-pay | Admitting: Cardiovascular Disease

## 2021-03-27 DIAGNOSIS — R079 Chest pain, unspecified: Secondary | ICD-10-CM

## 2021-03-27 DIAGNOSIS — R002 Palpitations: Secondary | ICD-10-CM

## 2021-03-27 NOTE — Telephone Encounter (Signed)
Order routine GXT instead of Lexiscan

## 2021-03-27 NOTE — Telephone Encounter (Signed)
    Pt is upset that her nuclear test was cancelled and no one had call her ahead of time that her PA is still pending, she wanted to speak with PA dept. Secure chat Anisah and she said that she informed her MD & pa that her insurance company requested a peer to peer to approve the auth.  And it was not done yet. Pt would like to speak with someone who can explain it to her.

## 2021-03-27 NOTE — Telephone Encounter (Signed)
Spoke to patient she stated she drove 30 miles this morning to have [place] and she was told insurance has denied coverage.Stated she is upset someone should have called to let her know.She wanted Dr.Berry to know.Stated she wanted to make sure her other test echo and monitor will be covered and when will her lexiscan be covered.Stated she would like Dr.Berry's RN to call her back this afternoon.Advised I will send a message to [facility] and pre cert team.

## 2021-03-29 ENCOUNTER — Encounter: Payer: Self-pay | Admitting: *Deleted

## 2021-03-29 NOTE — Telephone Encounter (Signed)
Spoke with pt regarding lexiscan being denied by insurance. Per Dr. Gwenlyn Found we will order a exercise tolerance test instead. Pt states that she is ok to move forward with this. Will place orders and have scheduling team reach out to pt to make an appointment for this. Pt verbalizes understanding.

## 2021-04-05 ENCOUNTER — Encounter (HOSPITAL_COMMUNITY): Payer: Self-pay

## 2021-04-05 DIAGNOSIS — I10 Essential (primary) hypertension: Secondary | ICD-10-CM | POA: Diagnosis not present

## 2021-04-05 DIAGNOSIS — R002 Palpitations: Secondary | ICD-10-CM | POA: Diagnosis not present
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
## 2021-04-06 ENCOUNTER — Other Ambulatory Visit: Payer: Self-pay

## 2021-04-06 ENCOUNTER — Inpatient Hospital Stay (HOSPITAL_COMMUNITY): Payer: Medicare HMO | Attending: Hematology | Admitting: Hematology

## 2021-04-06 ENCOUNTER — Inpatient Hospital Stay (HOSPITAL_COMMUNITY): Payer: Medicare HMO

## 2021-04-06 VITALS — BP 130/69 | HR 76 | Temp 98.2°F | Resp 16 | Ht 66.0 in | Wt 181.2 lb

## 2021-04-06 DIAGNOSIS — E039 Hypothyroidism, unspecified: Secondary | ICD-10-CM | POA: Insufficient documentation

## 2021-04-06 DIAGNOSIS — D72829 Elevated white blood cell count, unspecified: Secondary | ICD-10-CM | POA: Insufficient documentation

## 2021-04-06 DIAGNOSIS — Z79899 Other long term (current) drug therapy: Secondary | ICD-10-CM | POA: Insufficient documentation

## 2021-04-06 DIAGNOSIS — Z7982 Long term (current) use of aspirin: Secondary | ICD-10-CM | POA: Diagnosis not present

## 2021-04-06 DIAGNOSIS — Z803 Family history of malignant neoplasm of breast: Secondary | ICD-10-CM | POA: Diagnosis not present

## 2021-04-06 DIAGNOSIS — E785 Hyperlipidemia, unspecified: Secondary | ICD-10-CM | POA: Diagnosis not present

## 2021-04-06 DIAGNOSIS — I10 Essential (primary) hypertension: Secondary | ICD-10-CM | POA: Insufficient documentation

## 2021-04-06 LAB — CBC WITH DIFFERENTIAL/PLATELET
Abs Immature Granulocytes: 0.03 10*3/uL (ref 0.00–0.07)
Basophils Absolute: 0.1 10*3/uL (ref 0.0–0.1)
Basophils Relative: 1 %
Eosinophils Absolute: 0.2 10*3/uL (ref 0.0–0.5)
Eosinophils Relative: 2 %
HCT: 43.4 % (ref 36.0–46.0)
Hemoglobin: 14.2 g/dL (ref 12.0–15.0)
Immature Granulocytes: 0 %
Lymphocytes Relative: 21 %
Lymphs Abs: 2.2 10*3/uL (ref 0.7–4.0)
MCH: 28.7 pg (ref 26.0–34.0)
MCHC: 32.7 g/dL (ref 30.0–36.0)
MCV: 87.9 fL (ref 80.0–100.0)
Monocytes Absolute: 1.5 10*3/uL — ABNORMAL HIGH (ref 0.1–1.0)
Monocytes Relative: 14 %
Neutro Abs: 6.7 10*3/uL (ref 1.7–7.7)
Neutrophils Relative %: 62 %
Platelets: 239 10*3/uL (ref 150–400)
RBC: 4.94 MIL/uL (ref 3.87–5.11)
RDW: 13.4 % (ref 11.5–15.5)
WBC: 10.7 10*3/uL — ABNORMAL HIGH (ref 4.0–10.5)
nRBC: 0 % (ref 0.0–0.2)

## 2021-04-06 LAB — LACTATE DEHYDROGENASE: LDH: 135 U/L (ref 98–192)

## 2021-04-06 LAB — SEDIMENTATION RATE: Sed Rate: 5 mm/hr (ref 0–22)

## 2021-04-06 LAB — C-REACTIVE PROTEIN: CRP: 0.5 mg/dL (ref <1.0)

## 2021-04-06 NOTE — Patient Instructions (Addendum)
[place] at [facility] Discharge Instructions  You were seen and examined today by Dr. Delton Coombes. Dr. Delton Coombes is a hematologist, meaning that he specializes in blood abnormalities. Dr. Delton Coombes discussed your past medical history, family history of cancers/blood conditions and the events that led to you being here today.  You were referred to Dr. Delton Coombes due to an increase in your white blood cell count. Dr. Delton Coombes has recommended additional lab work today. Dr. Delton Coombes is ruling out infection and chronic leukemias.  Follow-up as scheduled.   Thank you for choosing [place] at [facility] to provide your oncology and hematology care.  To afford each patient quality time with our provider, please arrive at least 15 minutes before your scheduled appointment time.   If you have a lab appointment with the [facility] please come in thru the Main Entrance and check in at the main information desk.  You need to re-schedule your appointment should you arrive 10 or more minutes late.  We strive to give you quality time with our providers, and arriving late affects you and other patients whose appointments are after yours.  Also, if you no show three or more times for appointments you may be dismissed from the clinic at the providers discretion.     Again, thank you for choosing [facility]  Our hope is that these requests will decrease the amount of time that you wait before being seen by our physicians.       _____________________________________________________________  Should you have questions after your visit to [facility], please contact our office at [phone] and follow the prompts.  Our office hours are 8:00 a.m. and 4:30 p.m. Monday - Friday.  Please note that voicemails left after 4:00 p.m. may not be returned until the following business day.  We are closed weekends and major holidays.  You do  have access to a nurse 24-7, just call the main number to the clinic [phone] and do not press any options, hold on the line and a nurse will answer the phone.    For prescription refill requests, have your pharmacy contact our office and allow 72 hours.    Due to Covid, you will need to wear a mask upon entering the hospital. If you do not have a mask, a mask will be given to you at the Main Entrance upon arrival. For doctor visits, patients may have 1 support person age 12 or older with them. For treatment visits, patients can not have anyone with them due to social distancing guidelines and our immunocompromised population.

## 2021-04-07 LAB — RHEUMATOID FACTOR: Rheumatoid fact SerPl-aCnc: <10 IU/mL (ref <14.0)

## 2021-04-09 ENCOUNTER — Other Ambulatory Visit (HOSPITAL_COMMUNITY): Payer: Self-pay | Admitting: Internal Medicine

## 2021-04-09 DIAGNOSIS — E2839 Other primary ovarian failure: Secondary | ICD-10-CM

## 2021-04-10 ENCOUNTER — Ambulatory Visit (HOSPITAL_COMMUNITY)
Admission: RE | Admit: 2021-04-10 | Payer: Medicare HMO | Source: Ambulatory Visit | Attending: Cardiovascular Disease | Admitting: Cardiovascular Disease

## 2021-04-11 ENCOUNTER — Other Ambulatory Visit: Payer: Self-pay

## 2021-04-11 ENCOUNTER — Ambulatory Visit (HOSPITAL_COMMUNITY): Payer: Medicare HMO | Attending: Cardiology

## 2021-04-11 DIAGNOSIS — R5383 Other fatigue: Secondary | ICD-10-CM | POA: Diagnosis not present

## 2021-04-11 DIAGNOSIS — I10 Essential (primary) hypertension: Secondary | ICD-10-CM | POA: Diagnosis not present

## 2021-04-11 DIAGNOSIS — R002 Palpitations: Secondary | ICD-10-CM | POA: Diagnosis not present

## 2021-04-11 LAB — ECHOCARDIOGRAM COMPLETE: Area-P 1/2: 3.53 cm2

## 2021-04-12 ENCOUNTER — Telehealth (HOSPITAL_COMMUNITY): Payer: Self-pay | Admitting: *Deleted

## 2021-04-12 ENCOUNTER — Encounter (INDEPENDENT_AMBULATORY_CARE_PROVIDER_SITE_OTHER): Payer: Self-pay | Admitting: *Deleted

## 2021-04-12 LAB — BCR-ABL1, CML/ALL, PCR, QUANT: Interpretation (BCRAL):: NEGATIVE

## 2021-04-12 NOTE — Telephone Encounter (Signed)
Close encounter 

## 2021-04-13 ENCOUNTER — Other Ambulatory Visit: Payer: Self-pay

## 2021-04-13 ENCOUNTER — Ambulatory Visit (HOSPITAL_COMMUNITY)
Admission: RE | Admit: 2021-04-13 | Discharge: 2021-04-13 | Disposition: A | Discharge status: Home / Self Care | Payer: Medicare HMO | Source: Ambulatory Visit | Attending: Cardiology | Admitting: Cardiology

## 2021-04-13 DIAGNOSIS — Z8249 Family history of ischemic heart disease and other diseases of the circulatory system: Secondary | ICD-10-CM | POA: Diagnosis not present

## 2021-04-13 DIAGNOSIS — R5383 Other fatigue: Secondary | ICD-10-CM

## 2021-04-13 DIAGNOSIS — R002 Palpitations: Secondary | ICD-10-CM | POA: Diagnosis not present

## 2021-04-13 DIAGNOSIS — I10 Essential (primary) hypertension: Secondary | ICD-10-CM | POA: Insufficient documentation

## 2021-04-13 LAB — MYOCARDIAL PERFUSION IMAGING
LV dias vol: 74 mL (ref 46–106)
LV sys vol: 27 mL
Peak HR: 96 {beats}/min
Rest HR: 63 {beats}/min
SDS: 0
SRS: 1
SSS: 1
TID: 1.04

## 2021-04-13 MED ORDER — TECHNETIUM TC 99M TETROFOSMIN IV KIT
10.2000 | PACK | Freq: Once | INTRAVENOUS | Status: AC | PRN
  Administered 2021-04-13: 10.2 via INTRAVENOUS
  Filled 2021-04-13: qty 11

## 2021-04-13 MED ORDER — REGADENOSON 0.4 MG/5ML IV SOLN
0.4000 mg | Freq: Once | INTRAVENOUS | Status: AC
  Administered 2021-04-13: 0.4 mg via INTRAVENOUS

## 2021-04-13 MED ORDER — TECHNETIUM TC 99M TETROFOSMIN IV KIT
31.6000 | PACK | Freq: Once | INTRAVENOUS | Status: AC | PRN
  Administered 2021-04-13: 31.6 via INTRAVENOUS
  Filled 2021-04-13: qty 32

## 2021-04-14 LAB — ANTINUCLEAR ANTIBODIES, IFA: ANA Ab, IFA: NEGATIVE

## 2021-04-17 DIAGNOSIS — H40013 Open angle with borderline findings, low risk, bilateral: Secondary | ICD-10-CM | POA: Diagnosis not present

## 2021-04-17 DIAGNOSIS — H04123 Dry eye syndrome of bilateral lacrimal glands: Secondary | ICD-10-CM | POA: Diagnosis not present

## 2021-04-17 DIAGNOSIS — H2513 Age-related nuclear cataract, bilateral: Secondary | ICD-10-CM | POA: Diagnosis not present

## 2021-04-17 DIAGNOSIS — H25013 Cortical age-related cataract, bilateral: Secondary | ICD-10-CM | POA: Diagnosis not present

## 2021-04-17 LAB — CALR + JAK2 E12-15 + MPL (REFLEXED)

## 2021-04-17 LAB — JAK2 V617F, W REFLEX TO CALR/E12/MPL

## 2021-04-29 NOTE — Progress Notes (Deleted)
NO SHOW

## 2021-04-30 ENCOUNTER — Inpatient Hospital Stay (HOSPITAL_COMMUNITY): Payer: Medicare HMO | Attending: Hematology | Admitting: Physician Assistant

## 2021-04-30 ENCOUNTER — Other Ambulatory Visit: Payer: Self-pay

## 2021-04-30 ENCOUNTER — Ambulatory Visit (HOSPITAL_COMMUNITY): Payer: Medicare HMO | Admitting: Hematology

## 2021-04-30 VITALS — BP 136/69 | HR 75 | Temp 98.6°F | Resp 16 | Wt 180.0 lb

## 2021-04-30 DIAGNOSIS — I10 Essential (primary) hypertension: Secondary | ICD-10-CM | POA: Diagnosis not present

## 2021-04-30 DIAGNOSIS — E785 Hyperlipidemia, unspecified: Secondary | ICD-10-CM | POA: Insufficient documentation

## 2021-04-30 DIAGNOSIS — E039 Hypothyroidism, unspecified: Secondary | ICD-10-CM | POA: Diagnosis not present

## 2021-04-30 DIAGNOSIS — Z7982 Long term (current) use of aspirin: Secondary | ICD-10-CM | POA: Diagnosis not present

## 2021-04-30 DIAGNOSIS — D72829 Elevated white blood cell count, unspecified: Secondary | ICD-10-CM | POA: Insufficient documentation

## 2021-04-30 DIAGNOSIS — Z806 Family history of leukemia: Secondary | ICD-10-CM | POA: Diagnosis not present

## 2021-04-30 DIAGNOSIS — Z79899 Other long term (current) drug therapy: Secondary | ICD-10-CM | POA: Diagnosis not present

## 2021-04-30 DIAGNOSIS — Z803 Family history of malignant neoplasm of breast: Secondary | ICD-10-CM | POA: Insufficient documentation
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
## 2021-04-30 NOTE — Patient Instructions (Signed)
[employer] [place] at [facility] Discharge Instructions  You were seen today by Tarri Abernethy PA-C for your leukocytosis (elevated blood count).  Your labs did not show any major abnormalities and your white blood cells are only mildly elevated.  This is likely what we call reactive leukocytosis, and is nothing that we need to worry about in the long-term.  We will check labs and do a phone visit with you in 6 months, and if everything is normal at that time, he likely will not need to see [place] any further.  LABS: Return in 6 months for labs  MEDICATIONS: No changes to home medications  FOLLOW-UP APPOINTMENT: Phone visit in 6 months (if you change your mind and would like to be seen in person, please call the week before to change to an office visit)   Thank you for choosing [place] at [facility] to provide your oncology and hematology care.  To afford each patient quality time with our provider, please arrive at least 15 minutes before your scheduled appointment time.   If you have a lab appointment with the [place] please come in thru the Main Entrance and check in at the main information desk.  You need to re-schedule your appointment should you arrive 10 or more minutes late.  We strive to give you quality time with our providers, and arriving late affects you and other patients whose appointments are after yours.  Also, if you no show three or more times for appointments you may be dismissed from the clinic at the providers discretion.     Again, thank you for choosing [facility].  Our hope is that these requests will decrease the amount of time that you wait before being seen by our physicians.       _____________________________________________________________  Should you have questions after your visit to [facility], please contact our office at [phone] and follow the prompts.  Our office hours are 8:00  a.m. and 4:30 p.m. Monday - Friday.  Please note that voicemails left after 4:00 p.m. may not be returned until the following business day.  We are closed weekends and major holidays.  You do have access to a nurse 24-7, just call the main number to the clinic [phone] and do not press any options, hold on the line and a nurse will answer the phone.    For prescription refill requests, have your pharmacy contact our office and allow 72 hours.    Due to Covid, you will need to wear a mask upon entering the hospital. If you do not have a mask, a mask will be given to you at the Main Entrance upon arrival. For doctor visits, patients may have 1 support person age 62 or older with them. For treatment visits, patients can not have anyone with them due to social distancing guidelines and our immunocompromised population.

## 2021-05-03 ENCOUNTER — Other Ambulatory Visit: Payer: Self-pay

## 2021-05-03 ENCOUNTER — Ambulatory Visit (HOSPITAL_COMMUNITY)
Admission: RE | Admit: 2021-05-03 | Discharge: 2021-05-03 | Disposition: A | Discharge status: Home / Self Care | Payer: Medicare HMO | Source: Ambulatory Visit | Attending: Internal Medicine | Admitting: Internal Medicine

## 2021-05-03 DIAGNOSIS — E2839 Other primary ovarian failure: Secondary | ICD-10-CM | POA: Insufficient documentation

## 2021-05-03 DIAGNOSIS — M8588 Other specified disorders of bone density and structure, other site: Secondary | ICD-10-CM | POA: Diagnosis not present

## 2021-05-03 DIAGNOSIS — Z780 Asymptomatic menopausal state: Secondary | ICD-10-CM | POA: Diagnosis not present

## 2021-05-03 DIAGNOSIS — M810 Age-related osteoporosis without current pathological fracture: Secondary | ICD-10-CM | POA: Diagnosis not present

## 2021-05-16 ENCOUNTER — Other Ambulatory Visit: Payer: Self-pay

## 2021-05-16 ENCOUNTER — Ambulatory Visit: Payer: Medicare HMO | Admitting: Cardiovascular Disease

## 2021-05-16 ENCOUNTER — Encounter: Payer: Self-pay | Admitting: Cardiovascular Disease

## 2021-05-16 VITALS — BP 128/68 | HR 74 | Ht 65.5 in | Wt 181.2 lb

## 2021-05-16 DIAGNOSIS — R002 Palpitations: Secondary | ICD-10-CM

## 2021-05-16 NOTE — Progress Notes (Signed)
Kaitlyn Baker returns today for follow-up of her noninvasive test performed because of fatigue and palpitations.  Her Myoview stress test and 2D echo were essentially normal.  Her event monitor showed occasional PACs, PVCs and short runs of SVT.  She actually feels somewhat clinically improved since I saw her a month ago.  She still denies chest pain or shortness of breath.  I will have her see 8 APP back in 6 months and me back in 1 year.  Lorretta Harp, M.D., [place], [facility], [place] [address]. [place], [place]  [address]  [phone] 05/16/2021 9:55 AM

## 2021-05-16 NOTE — Patient Instructions (Signed)
  Follow-Up: At [facility], you and your health needs are our priority.  As part of our continuing mission to provide you with exceptional heart care, we have created designated Provider Care Teams.  These Care Teams include your primary Cardiologist (physician) and Advanced Practice Providers (APPs -  Physician Assistants and Nurse Practitioners) who all work together to provide you with the care you need, when you need it.  We recommend signing up for the patient portal called "MyChart".  Sign up information is provided on this After Visit Summary.  MyChart is used to connect with patients for Virtual Visits (Telemedicine).  Patients are able to view lab/test results, encounter notes, upcoming appointments, etc.  Non-urgent messages can be sent to your provider as well.   To learn more about what you can do with MyChart, go to [email or web address].    Your next appointment:   6 month(s)  The format for your next appointment:   In Person  Provider:   You will see one of the following Advanced Practice Providers on your designated Care Team:   Sande Rives, PA-C Coletta Memos, FNP  Then, Quay Burow MD will plan to see you again in 12 month(s).

## 2021-05-22 DIAGNOSIS — H2513 Age-related nuclear cataract, bilateral: Secondary | ICD-10-CM | POA: Diagnosis not present

## 2021-05-22 DIAGNOSIS — H2511 Age-related nuclear cataract, right eye: Secondary | ICD-10-CM | POA: Diagnosis not present

## 2021-05-22 DIAGNOSIS — H25013 Cortical age-related cataract, bilateral: Secondary | ICD-10-CM | POA: Diagnosis not present

## 2021-05-22 DIAGNOSIS — H04123 Dry eye syndrome of bilateral lacrimal glands: Secondary | ICD-10-CM | POA: Diagnosis not present

## 2021-05-30 DIAGNOSIS — H2511 Age-related nuclear cataract, right eye: Secondary | ICD-10-CM | POA: Diagnosis not present

## 2021-05-30 DIAGNOSIS — H25811 Combined forms of age-related cataract, right eye: Secondary | ICD-10-CM | POA: Diagnosis not present

## 2021-06-07 DIAGNOSIS — H25012 Cortical age-related cataract, left eye: Secondary | ICD-10-CM | POA: Diagnosis not present

## 2021-06-07 DIAGNOSIS — H2512 Age-related nuclear cataract, left eye: Secondary | ICD-10-CM | POA: Diagnosis not present

## 2021-06-18 IMAGING — DX CHEST - 2 VIEW
2 series · 2 of 2 positions shown · non-contrast
Comparison: Radiograph 10/19/2009, 04/30/2008

CLINICAL DATA: Dyspnea, shortness of breath for 3 weeks

EXAM:
CHEST - 2 VIEW

[chest pa]
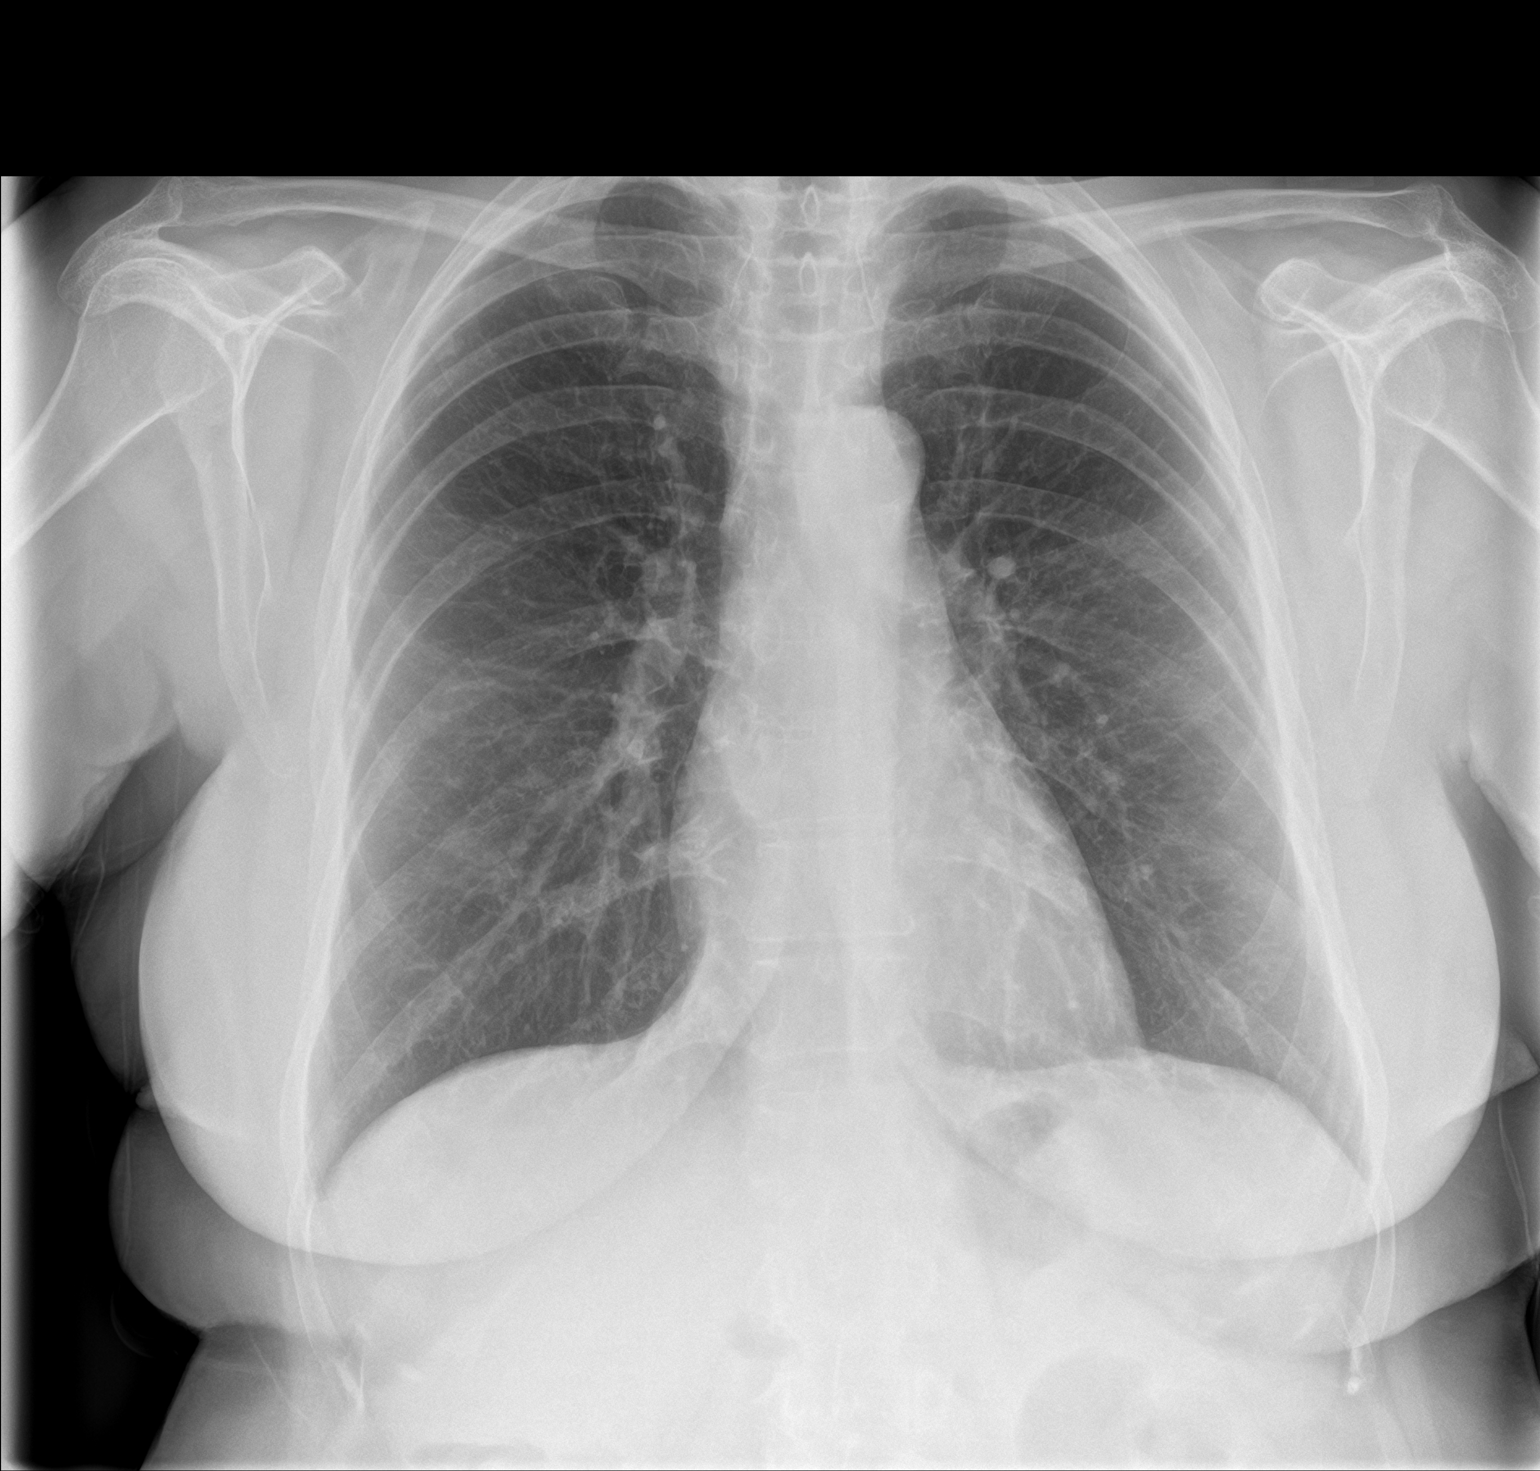

[chest lat]
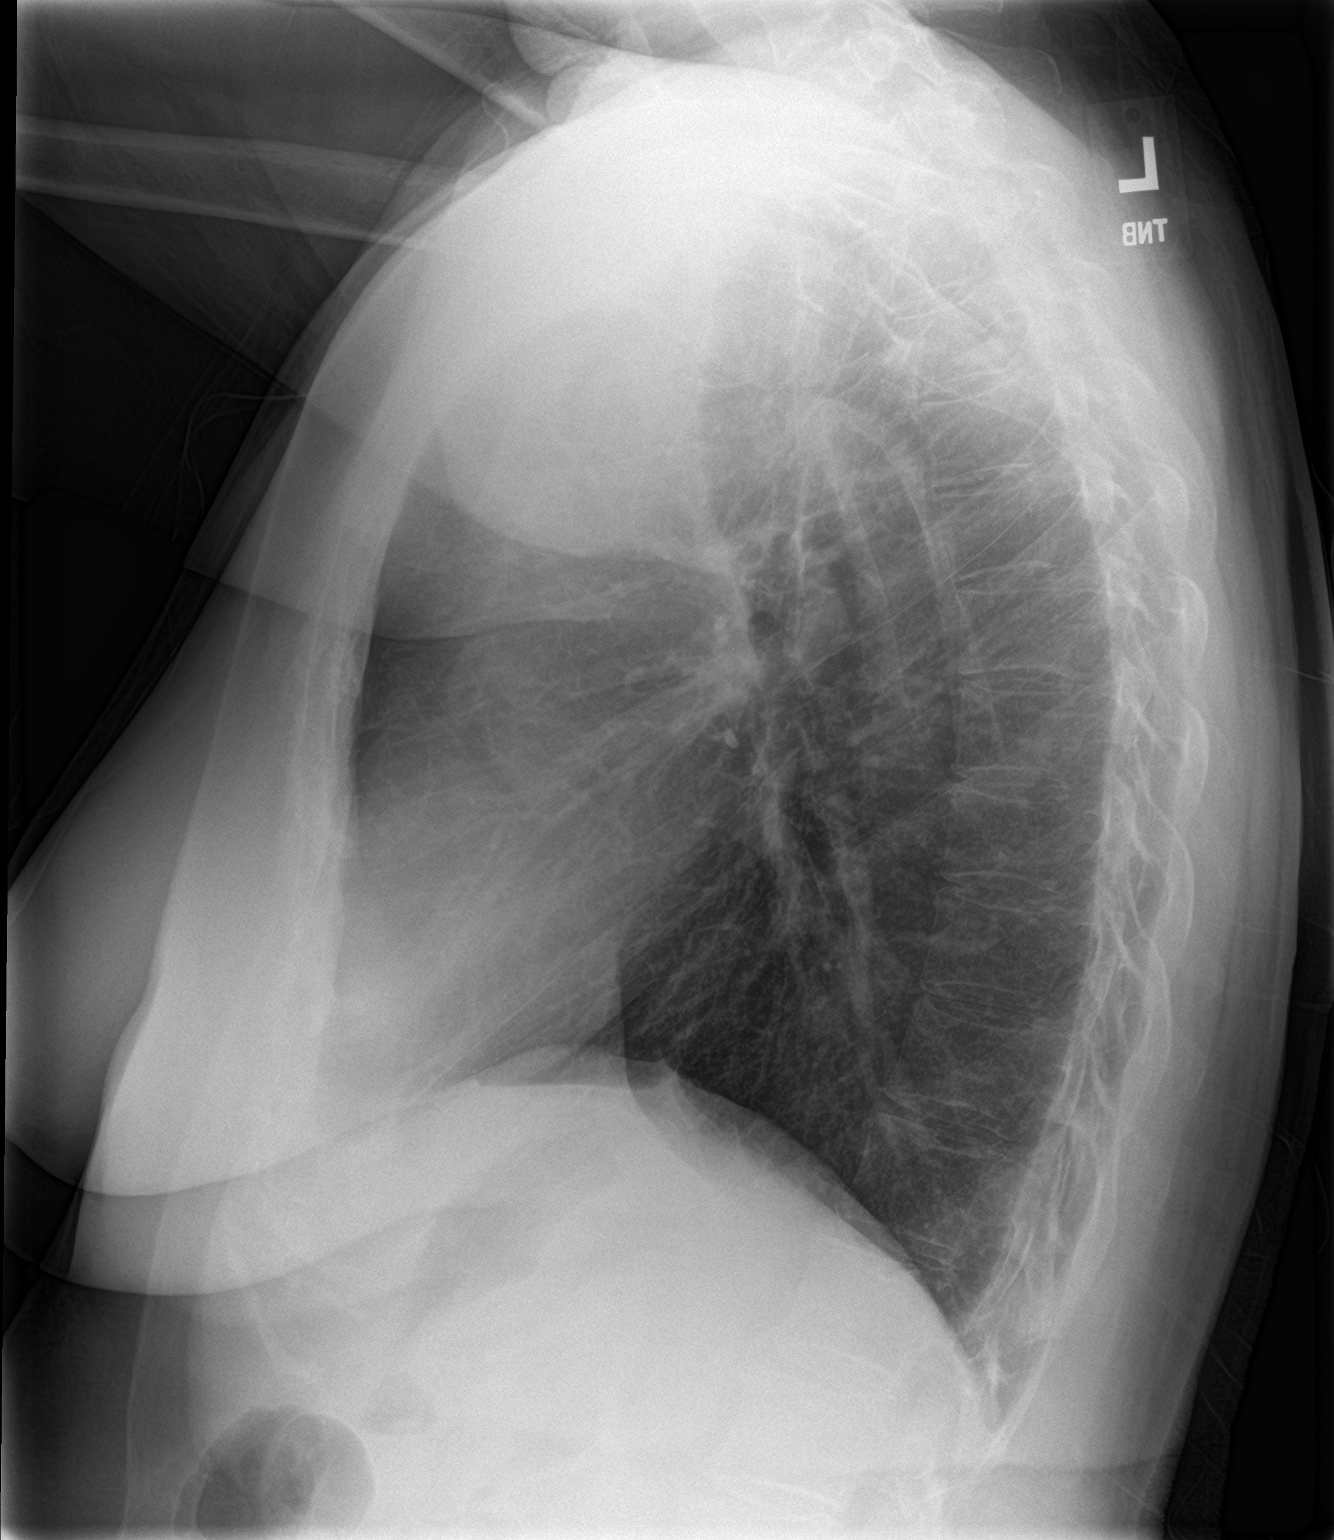

[2 of 2 positions shown; findings below may reference images not displayed]

FINDINGS: No consolidation, features of edema, pneumothorax, or effusion.
Pulmonary vascularity is normally distributed. The cardiomediastinal
contours are unremarkable. No acute osseous or soft tissue
abnormality.
IMPRESSION: No acute cardiopulmonary abnormality

## 2021-06-20 DIAGNOSIS — H25012 Cortical age-related cataract, left eye: Secondary | ICD-10-CM | POA: Diagnosis not present

## 2021-06-20 DIAGNOSIS — H2512 Age-related nuclear cataract, left eye: Secondary | ICD-10-CM | POA: Diagnosis not present

## 2021-06-20 DIAGNOSIS — H25812 Combined forms of age-related cataract, left eye: Secondary | ICD-10-CM | POA: Diagnosis not present

## 2021-06-29 ENCOUNTER — Ambulatory Visit: Payer: Medicare HMO | Admitting: Gastroenterology

## 2021-07-12 DIAGNOSIS — H524 Presbyopia: Secondary | ICD-10-CM | POA: Diagnosis not present

## 2021-07-23 DIAGNOSIS — Z23 Encounter for immunization: Secondary | ICD-10-CM | POA: Diagnosis not present

## 2021-08-13 ENCOUNTER — Ambulatory Visit (INDEPENDENT_AMBULATORY_CARE_PROVIDER_SITE_OTHER): Payer: Medicare HMO | Admitting: Gastroenterology

## 2021-08-21 ENCOUNTER — Ambulatory Visit (INDEPENDENT_AMBULATORY_CARE_PROVIDER_SITE_OTHER): Payer: Medicare HMO | Admitting: Internal Medicine

## 2021-08-21 ENCOUNTER — Encounter (INDEPENDENT_AMBULATORY_CARE_PROVIDER_SITE_OTHER): Payer: Self-pay | Admitting: Internal Medicine

## 2021-08-21 ENCOUNTER — Other Ambulatory Visit: Payer: Self-pay

## 2021-08-21 DIAGNOSIS — R1013 Epigastric pain: Secondary | ICD-10-CM | POA: Insufficient documentation

## 2021-08-21 DIAGNOSIS — Z86010 Personal history of colonic polyps: Secondary | ICD-10-CM | POA: Diagnosis not present

## 2021-08-21 MED ORDER — PANTOPRAZOLE SODIUM 40 MG PO TBEC: 40.0000 mg | DELAYED_RELEASE_TABLET | Freq: Every day | ORAL | 2 refills | Status: DC
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
## 2021-08-21 NOTE — Patient Instructions (Signed)
Physician will call with results of Ultrasound and blood work when completed

## 2021-08-22 LAB — CBC WITH DIFFERENTIAL/PLATELET
Absolute Monocytes: 1946 cells/uL — ABNORMAL HIGH (ref 200–950)
Basophils Absolute: 46 cells/uL (ref 0–200)
Basophils Relative: 0.3 %
Eosinophils Absolute: 137 cells/uL (ref 15–500)
Eosinophils Relative: 0.9 %
HCT: 42.8 % (ref 35.0–45.0)
Hemoglobin: 14.2 g/dL (ref 11.7–15.5)
Lymphs Abs: 2447 cells/uL (ref 850–3900)
MCH: 27.8 pg (ref 27.0–33.0)
MCHC: 33.2 g/dL (ref 32.0–36.0)
MCV: 83.9 fL (ref 80.0–100.0)
MPV: 12.1 fL (ref 7.5–12.5)
Monocytes Relative: 12.8 %
Neutro Abs: 10625 cells/uL — ABNORMAL HIGH (ref 1500–7800)
Neutrophils Relative %: 69.9 %
Platelets: 250 10*3/uL (ref 140–400)
RBC: 5.1 10*6/uL (ref 3.80–5.10)
RDW: 13.1 % (ref 11.0–15.0)
Total Lymphocyte: 16.1 %
WBC: 15.2 10*3/uL — ABNORMAL HIGH (ref 3.8–10.8)

## 2021-08-22 LAB — COMPREHENSIVE METABOLIC PANEL
AG Ratio: 2.1 (calc) (ref 1.0–2.5)
ALT: 14 U/L (ref 6–29)
AST: 15 U/L (ref 10–35)
Albumin: 4.6 g/dL (ref 3.6–5.1)
Alkaline phosphatase (APISO): 94 U/L (ref 37–153)
BUN: 14 mg/dL (ref 7–25)
CO2: 23 mmol/L (ref 20–32)
Calcium: 9.9 mg/dL (ref 8.6–10.4)
Chloride: 106 mmol/L (ref 98–110)
Creat: 0.75 mg/dL (ref 0.60–0.95)
Globulin: 2.2 g/dL (calc) (ref 1.9–3.7)
Glucose, Bld: 85 mg/dL (ref 65–99)
Potassium: 4.3 mmol/L (ref 3.5–5.3)
Sodium: 140 mmol/L (ref 135–146)
Total Bilirubin: 2.4 mg/dL — ABNORMAL HIGH (ref 0.2–1.2)
Total Protein: 6.8 g/dL (ref 6.1–8.1)

## 2021-08-22 LAB — LIPASE: Lipase: 5 U/L — ABNORMAL LOW (ref 7–60)

## 2021-08-27 ENCOUNTER — Ambulatory Visit (HOSPITAL_COMMUNITY)
Admission: RE | Admit: 2021-08-27 | Discharge: 2021-08-27 | Disposition: A | Discharge status: Home / Self Care | Payer: Medicare HMO | Source: Ambulatory Visit | Attending: Internal Medicine | Admitting: Internal Medicine

## 2021-08-27 ENCOUNTER — Other Ambulatory Visit: Payer: Self-pay

## 2021-08-27 DIAGNOSIS — R1013 Epigastric pain: Secondary | ICD-10-CM | POA: Diagnosis not present

## 2021-08-30 ENCOUNTER — Telehealth (INDEPENDENT_AMBULATORY_CARE_PROVIDER_SITE_OTHER): Payer: Self-pay | Admitting: *Deleted

## 2021-08-30 NOTE — Telephone Encounter (Signed)
Pt missed dr Olevia Perches call on 11/8 and calling back about u/s results.   [phone]

## 2021-08-30 NOTE — Telephone Encounter (Signed)
Pt called asking about if her colonoscopy had been scheduled. She was seen 08/21/21. It was not ordered on encounter form so she was not taken over to see you at her visit but I looked in her note and dr Laural Golden did say she needed one. Pt states she can do colonoscopy any day. Pt states her insurance only pays for a screening colonoscopy every 10 years so it needs to be coded as surveillance.   Dr. Olevia Perches office note from visit  copied below:  #2.  History of colonic adenomas.  She had colonoscopy in April 2016 with removal of 4 and 8 mm tubular adenomas.  She is in very good health and therefore surveillance colonoscopy would be recommended.    Pt's number [phone]

## 2021-08-30 NOTE — Telephone Encounter (Signed)
Dr Laural Golden states he will call pt

## 2021-09-06 ENCOUNTER — Other Ambulatory Visit (INDEPENDENT_AMBULATORY_CARE_PROVIDER_SITE_OTHER): Payer: Self-pay

## 2021-09-06 ENCOUNTER — Encounter (INDEPENDENT_AMBULATORY_CARE_PROVIDER_SITE_OTHER): Payer: Self-pay

## 2021-09-06 DIAGNOSIS — Z86010 Personal history of colonic polyps: Secondary | ICD-10-CM

## 2021-09-20 ENCOUNTER — Encounter (INDEPENDENT_AMBULATORY_CARE_PROVIDER_SITE_OTHER): Payer: Self-pay | Admitting: *Deleted

## 2021-09-20 ENCOUNTER — Ambulatory Visit (HOSPITAL_COMMUNITY)
Admission: RE | Admit: 2021-09-20 | Discharge: 2021-09-20 | Disposition: A | Discharge status: Home / Self Care | Payer: Medicare HMO | Attending: Internal Medicine | Admitting: Internal Medicine

## 2021-09-20 ENCOUNTER — Other Ambulatory Visit: Payer: Self-pay

## 2021-09-20 ENCOUNTER — Encounter (HOSPITAL_COMMUNITY): Payer: Self-pay | Admitting: Internal Medicine

## 2021-09-20 ENCOUNTER — Encounter (HOSPITAL_COMMUNITY)
Admission: RE | Disposition: A | Discharge status: Home / Self Care | Payer: Self-pay | Source: Home / Self Care | Attending: Internal Medicine

## 2021-09-20 DIAGNOSIS — Z86010 Personal history of colonic polyps: Secondary | ICD-10-CM | POA: Diagnosis not present

## 2021-09-20 DIAGNOSIS — Z1211 Encounter for screening for malignant neoplasm of colon: Secondary | ICD-10-CM | POA: Diagnosis not present

## 2021-09-20 DIAGNOSIS — K5730 Diverticulosis of large intestine without perforation or abscess without bleeding: Secondary | ICD-10-CM | POA: Diagnosis not present

## 2021-09-20 DIAGNOSIS — K6289 Other specified diseases of anus and rectum: Secondary | ICD-10-CM | POA: Diagnosis not present

## 2021-09-20 DIAGNOSIS — K644 Residual hemorrhoidal skin tags: Secondary | ICD-10-CM | POA: Insufficient documentation

## 2021-09-20 DIAGNOSIS — K6389 Other specified diseases of intestine: Secondary | ICD-10-CM | POA: Insufficient documentation

## 2021-09-20 HISTORY — PX: COLONOSCOPY: SHX5424

## 2021-09-20 LAB — HM COLONOSCOPY

## 2021-09-20 SURGERY — COLONOSCOPY
Anesthesia: Moderate Sedation

## 2021-09-20 MED ORDER — MEPERIDINE HCL 50 MG/ML IJ SOLN
INTRAMUSCULAR | Status: AC
  Filled 2021-09-20: qty 1

## 2021-09-20 MED ORDER — FENTANYL CITRATE (PF) 100 MCG/2ML IJ SOLN
INTRAMUSCULAR | Status: DC | PRN
  Administered 2021-09-20 (×2): 25 ug via INTRAVENOUS

## 2021-09-20 MED ORDER — FENTANYL CITRATE (PF) 100 MCG/2ML IJ SOLN
INTRAMUSCULAR | Status: AC
  Filled 2021-09-20: qty 2

## 2021-09-20 MED ORDER — MIDAZOLAM HCL 5 MG/5ML IJ SOLN
INTRAMUSCULAR | Status: AC
  Filled 2021-09-20: qty 10

## 2021-09-20 MED ORDER — MIDAZOLAM HCL 5 MG/5ML IJ SOLN
INTRAMUSCULAR | Status: DC | PRN
  Administered 2021-09-20 (×3): 1 mg via INTRAVENOUS

## 2021-09-20 MED ORDER — SODIUM CHLORIDE 0.9 % IV SOLN
INTRAVENOUS | Status: DC
  Administered 2021-09-20: 1000 mL via INTRAVENOUS

## 2021-09-20 NOTE — H&P (Signed)
Kaitlyn Baker is an 80 y.o. female.   Chief Complaint: Patient is here for colonoscopy HPI: Patient is 80 year old Caucasian female who has a history of multiple colonic adenomas who is here for surveillance colonoscopy.  She denies abdominal pain change in bowel habits or rectal bleeding.  Her last exam was in April 2016 with removal of 8 polyps and 4 were tubular adenomas. Family history is negative for CRC.  Past Medical History:  Diagnosis Date   Dyslipidemia    History of cardiac monitoring    HTN (hypertension)    Hypothyroidism    Rheumatic fever    as a child    Past Surgical History:  Procedure Laterality Date   COLONOSCOPY N/A 02/02/2015   Procedure: COLONOSCOPY;  Surgeon: Rogene Houston, MD;  [facility]: [facility];  Service: Endoscopy;  Laterality: N/A;  Harmony    Family History  Problem Relation Age of Onset   Coronary artery disease Father 70       cardiac arrest   Diabetes Father    Breast cancer Paternal Grandmother    Social History:  reports that she has never smoked. She has never used smokeless tobacco. She reports current alcohol use. She reports that she does not use drugs.  Allergies:  Allergies  Allergen Reactions   Methylprednisolone Anaphylaxis and Rash    Injection medication-Severe rash and edema-tongue and vocal cords   Morphine Anaphylaxis   Demerol [Meperidine] Swelling    Patient developed tongue swelling. He was treated with Benadryl and prednisone by PCP and is fine   Versed [Midazolam] Nausea And Vomiting    Hard time coming from under   Moviprep [Peg-Kcl-Nacl-Nasulf-Na Asc-C] Nausea And Vomiting    Severe itching all over, itching in the back of the throat, and lip swelling    Medications Prior to Admission  Medication Sig Dispense Refill   acetaminophen (TYLENOL) 500 MG tablet Take 500 mg by mouth daily as needed (pain).     amLODipine-benazepril (LOTREL) 10-20 MG per capsule  Take 1 capsule by mouth daily.     aspirin 81 MG tablet Take 81 mg by mouth daily.     Cholecalciferol (VITAMIN D3) 50 MCG (2000 UT) capsule Take 2,000 Units by mouth daily.     levothyroxine (SYNTHROID, LEVOTHROID) 50 MCG tablet Take 50 mcg by mouth daily.     pantoprazole (PROTONIX) 40 MG tablet Take 1 tablet (40 mg total) by mouth daily before breakfast. (Patient taking differently: Take 40 mg by mouth daily as needed (abdomal pain).) 30 tablet 2   simvastatin (ZOCOR) 20 MG tablet Take 20 mg by mouth daily.      No results found for this or any previous visit (from the past 48 hour(s)). No results found.  Review of Systems  Blood pressure 133/82, pulse 80, temperature 98.2 F (36.8 C), temperature source Oral, resp. rate 14, height 5\' 6"  (1.676 m), weight 78.9 kg, SpO2 98 %. Physical Exam HENT:     Mouth/Throat:     Mouth: Mucous membranes are moist.     Pharynx: Oropharynx is clear.  Eyes:     General: No scleral icterus.    Conjunctiva/sclera: Conjunctivae normal.  Cardiovascular:     Rate and Rhythm: Normal rate and regular rhythm.     Heart sounds: Normal heart sounds. No murmur heard. Pulmonary:     Effort: Pulmonary effort is normal.     Breath sounds:  Normal breath sounds.  Abdominal:     General: There is no distension.     Palpations: Abdomen is soft. There is no mass.     Tenderness: There is no abdominal tenderness.  Musculoskeletal:        General: No swelling.     Cervical back: Neck supple.  Lymphadenopathy:     Cervical: No cervical adenopathy.  Skin:    General: Skin is warm and dry.  Neurological:     Mental Status: She is alert.     Assessment/Plan  History of colonic polyps. Surveillance colonoscopy.  Hildred Laser, MD 09/20/2021, 10:38 AM

## 2021-09-20 NOTE — Discharge Instructions (Signed)
Resume usual medications as before. High-fiber diet. No driving for 24 hours.

## 2021-09-20 NOTE — Op Note (Signed)
[facility] Patient Name: Kaitlyn Baker Procedure Date: 09/20/2021 10:29 AM MRN: [id number] Date of Birth: 1941-06-09 Attending MD: Hildred Laser , MD CSN: [id number] Age: 80 Admit Type: Outpatient Procedure:                Colonoscopy Indications:              High risk colon cancer surveillance: Personal                            history of colonic polyps Providers:                Hildred Laser, MD, Lurline Del, RN, Randa Spike,                            Technician Referring MD:             Redmond School, MD Medicines:                Fentanyl 50 micrograms IV, Midazolam 3 mg IV Complications:            No immediate complications. Estimated Blood Loss:     Estimated blood loss: none. Procedure:                Pre-Anesthesia Assessment:                           - Prior to the procedure, a History and Physical                            was performed, and patient medications and                            allergies were reviewed. The patient's tolerance of                            previous anesthesia was also reviewed. The risks                            and benefits of the procedure and the sedation                            options and risks were discussed with the patient.                            All questions were answered, and informed consent                            was obtained. Prior Anticoagulants: The patient has                            taken no previous anticoagulant or antiplatelet                            agents. ASA Grade Assessment: II - A patient with  mild systemic disease. After reviewing the risks                            and benefits, the patient was deemed in                            satisfactory condition to undergo the procedure.                           After obtaining informed consent, the colonoscope                            was passed under direct vision. Throughout the                             procedure, the patient's blood pressure, pulse, and                            oxygen saturations were monitored continuously. The                            [id number]-HQ190L ([id number]) scope was introduced through                            the anus and advanced to the the cecum, identified                            by appendiceal orifice and ileocecal valve. The                            colonoscopy was performed without difficulty. The                            patient tolerated the procedure well. The quality                            of the bowel preparation was good. The ileocecal                            valve, appendiceal orifice, and rectum were                            photographed. Scope In: 11:00:36 AM Scope Out: 11:18:51 AM Scope Withdrawal Time: 0 hours 9 minutes 55 seconds  Total Procedure Duration: 0 hours 18 minutes 15 seconds  Findings:      The perianal and digital rectal examinations were normal.      A diffuse area of mild melanosis was found in the rectum, in the       recto-sigmoid colon, in the sigmoid colon, in the descending colon, at       the splenic flexure and in the transverse colon.      Multiple small and large-mouthed diverticula were found in the sigmoid       colon.      External hemorrhoids were found during retroflexion. The hemorrhoids  were small. Impression:               - Melanosis in the colon.                           - Diverticulosis in the sigmoid colon.                           - External hemorrhoids.                           - No specimens collected. Moderate Sedation:      Moderate (conscious) sedation was administered by the endoscopy nurse       and supervised by the endoscopist. The following parameters were       monitored: oxygen saturation, heart rate, blood pressure, CO2       capnography and response to care. Total physician intraservice time was       25 minutes. Recommendation:           - Patient has a contact  number available for                            emergencies. The signs and symptoms of potential                            delayed complications were discussed with the                            patient. Return to normal activities tomorrow.                            Written discharge instructions were provided to the                            patient.                           - High fiber diet today.                           - Continue present medications.                           - No repeat colonoscopy due to age and the absence                            of advanced adenomas. Procedure Code(s):        --- Professional ---                           [id number], Colonoscopy, flexible; diagnostic, including                            collection of specimen(s) by brushing or washing,                            when performed (separate procedure)  38466, Moderate sedation; each additional 15                            minutes intraservice time                           G0500, Moderate sedation services provided by the                            same physician or other qualified health care                            professional performing a gastrointestinal                            endoscopic service that sedation supports,                            requiring the presence of an independent trained                            observer to assist in the monitoring of the                            patient's level of consciousness and physiological                            status; initial 15 minutes of intra-service time;                            patient age 39 years or older (additional time may                            be reported with [phone], as appropriate) Diagnosis Code(s):        --- Professional ---                           Z86.010, Personal history of colonic polyps                           K63.89, Other specified diseases of intestine                            K64.4, Residual hemorrhoidal skin tags                           K57.30, Diverticulosis of large intestine without                            perforation or abscess without bleeding CPT copyright 2019 American Medical Association. All rights reserved. The codes documented in this report are preliminary and upon coder review may  be revised to meet current compliance requirements. Hildred Laser, MD Hildred Laser, MD 09/20/2021 11:25:11 AM This report has been signed electronically. Number of Addenda: 0

## 2021-09-26 ENCOUNTER — Encounter (HOSPITAL_COMMUNITY): Payer: Self-pay | Admitting: Internal Medicine

## 2021-10-25 ENCOUNTER — Other Ambulatory Visit: Payer: Self-pay | Admitting: Internal Medicine

## 2021-10-25 DIAGNOSIS — Z1231 Encounter for screening mammogram for malignant neoplasm of breast: Secondary | ICD-10-CM

## 2021-10-31 ENCOUNTER — Inpatient Hospital Stay (HOSPITAL_COMMUNITY): Payer: Medicare HMO | Attending: Hematology

## 2021-10-31 ENCOUNTER — Other Ambulatory Visit: Payer: Self-pay

## 2021-10-31 DIAGNOSIS — I10 Essential (primary) hypertension: Secondary | ICD-10-CM | POA: Diagnosis not present

## 2021-10-31 DIAGNOSIS — D72829 Elevated white blood cell count, unspecified: Secondary | ICD-10-CM | POA: Insufficient documentation

## 2021-10-31 DIAGNOSIS — Z803 Family history of malignant neoplasm of breast: Secondary | ICD-10-CM | POA: Diagnosis not present

## 2021-10-31 LAB — COMPREHENSIVE METABOLIC PANEL
ALT: 19 U/L (ref 0–44)
AST: 16 U/L (ref 15–41)
Albumin: 4.1 g/dL (ref 3.5–5.0)
Alkaline Phosphatase: 78 U/L (ref 38–126)
Anion gap: 5 (ref 5–15)
BUN: 17 mg/dL (ref 8–23)
CO2: 27 mmol/L (ref 22–32)
Calcium: 9.1 mg/dL (ref 8.9–10.3)
Chloride: 108 mmol/L (ref 98–111)
Creatinine, Ser: 0.75 mg/dL (ref 0.44–1.00)
GFR, Estimated: >60 mL/min (ref >60)
Glucose, Bld: 117 mg/dL — ABNORMAL HIGH (ref 70–99)
Potassium: 4.3 mmol/L (ref 3.5–5.1)
Sodium: 140 mmol/L (ref 135–145)
Total Bilirubin: 0.9 mg/dL (ref 0.3–1.2)
Total Protein: 6.5 g/dL (ref 6.5–8.1)

## 2021-10-31 LAB — CBC WITH DIFFERENTIAL/PLATELET
Abs Immature Granulocytes: 0.04 10*3/uL (ref 0.00–0.07)
Basophils Absolute: 0 10*3/uL (ref 0.0–0.1)
Basophils Relative: 0 %
Eosinophils Absolute: 0.3 10*3/uL (ref 0.0–0.5)
Eosinophils Relative: 3 %
HCT: 40.3 % (ref 36.0–46.0)
Hemoglobin: 12.8 g/dL (ref 12.0–15.0)
Immature Granulocytes: 0 %
Lymphocytes Relative: 28 %
Lymphs Abs: 2.7 10*3/uL (ref 0.7–4.0)
MCH: 27.2 pg (ref 26.0–34.0)
MCHC: 31.8 g/dL (ref 30.0–36.0)
MCV: 85.7 fL (ref 80.0–100.0)
Monocytes Absolute: 1.3 10*3/uL — ABNORMAL HIGH (ref 0.1–1.0)
Monocytes Relative: 14 %
Neutro Abs: 5.3 10*3/uL (ref 1.7–7.7)
Neutrophils Relative %: 55 %
Platelets: 259 10*3/uL (ref 150–400)
RBC: 4.7 MIL/uL (ref 3.87–5.11)
RDW: 13.7 % (ref 11.5–15.5)
WBC: 9.7 10*3/uL (ref 4.0–10.5)
nRBC: 0 % (ref 0.0–0.2)

## 2021-10-31 LAB — LACTATE DEHYDROGENASE: LDH: 134 U/L (ref 98–192)

## 2021-11-06 NOTE — Progress Notes (Signed)
t

## 2021-11-07 ENCOUNTER — Other Ambulatory Visit: Payer: Self-pay

## 2021-11-07 ENCOUNTER — Inpatient Hospital Stay (HOSPITAL_BASED_OUTPATIENT_CLINIC_OR_DEPARTMENT_OTHER): Payer: Medicare HMO | Admitting: Physician Assistant

## 2021-11-07 ENCOUNTER — Inpatient Hospital Stay (HOSPITAL_COMMUNITY): Payer: Medicare HMO | Admitting: Physician Assistant

## 2021-11-07 VITALS — BP 112/72 | HR 79 | Temp 97.1°F | Resp 18 | Wt 181.9 lb

## 2021-11-07 DIAGNOSIS — D72829 Elevated white blood cell count, unspecified: Secondary | ICD-10-CM

## 2021-11-07 DIAGNOSIS — Z803 Family history of malignant neoplasm of breast: Secondary | ICD-10-CM | POA: Diagnosis not present

## 2021-11-07 DIAGNOSIS — I10 Essential (primary) hypertension: Secondary | ICD-10-CM | POA: Diagnosis not present
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
## 2021-11-07 NOTE — Patient Instructions (Signed)
Kaitlyn Baker at [facility] Discharge Instructions  You were seen today by Tarri Abernethy PA-C for your elevated white blood cells.  Your WBC's have come back to normal.  We will release you from our care and allow your primary care provider to follow up on your blood counts at least once a year.  You should be referred back to [place] in the future if you have any persistent elevations of your WBC > 20,000.       Thank you for choosing [facility] to provide your oncology and hematology care.  To afford each patient quality time with our provider, please arrive at least 15 minutes before your scheduled appointment time.   If you have a lab appointment with the [facility] please come in thru the Main Entrance and check in at the main information desk.  You need to re-schedule your appointment should you arrive 10 or more minutes late.  We strive to give you quality time with our providers, and arriving late affects you and other patients whose appointments are after yours.  Also, if you no show three or more times for appointments you may be dismissed from the clinic at the providers discretion.     Again, thank you for choosing [facility].  Our hope is that these requests will decrease the amount of time that you wait before being seen by our physicians.       _____________________________________________________________  Should you have questions after your visit to [facility], please contact our office at [phone] and follow the prompts.  Our office hours are 8:00 a.m. and 4:30 p.m. Monday - Friday.  Please note that voicemails left after 4:00 p.m. may not be returned until the following business day.  We are closed weekends and major holidays.  You do have access to a nurse 24-7, just call the main number to the clinic [phone] and do not press any options, hold on the line and a nurse will answer the  phone.    For prescription refill requests, have your pharmacy contact our office and allow 72 hours.    Due to Covid, you will need to wear a mask upon entering the hospital. If you do not have a mask, a mask will be given to you at the Main Entrance upon arrival. For doctor visits, patients may have 1 support person age 39 or older with them. For treatment visits, patients can not have anyone with them due to social distancing guidelines and our immunocompromised population.

## 2021-11-19 ENCOUNTER — Ambulatory Visit
Admission: RE | Admit: 2021-11-19 | Discharge: 2021-11-19 | Disposition: A | Discharge status: Home / Self Care | Payer: Medicare HMO | Source: Ambulatory Visit | Attending: Internal Medicine | Admitting: Internal Medicine

## 2021-11-19 ENCOUNTER — Other Ambulatory Visit: Payer: Self-pay

## 2021-11-19 DIAGNOSIS — Z1231 Encounter for screening mammogram for malignant neoplasm of breast: Secondary | ICD-10-CM | POA: Diagnosis not present

## 2021-12-10 DIAGNOSIS — Z01419 Encounter for gynecological examination (general) (routine) without abnormal findings: Secondary | ICD-10-CM | POA: Diagnosis not present

## 2021-12-10 DIAGNOSIS — N952 Postmenopausal atrophic vaginitis: Secondary | ICD-10-CM | POA: Diagnosis not present

## 2021-12-11 ENCOUNTER — Encounter: Payer: Self-pay | Admitting: Cardiovascular Disease

## 2021-12-11 ENCOUNTER — Other Ambulatory Visit: Payer: Self-pay

## 2021-12-11 ENCOUNTER — Ambulatory Visit: Payer: Medicare HMO | Admitting: Cardiovascular Disease

## 2021-12-11 DIAGNOSIS — I10 Essential (primary) hypertension: Secondary | ICD-10-CM | POA: Diagnosis not present

## 2021-12-11 DIAGNOSIS — E785 Hyperlipidemia, unspecified: Secondary | ICD-10-CM

## 2021-12-11 DIAGNOSIS — R002 Palpitations: Secondary | ICD-10-CM | POA: Diagnosis not present

## 2021-12-11 DIAGNOSIS — I771 Stricture of artery: Secondary | ICD-10-CM | POA: Diagnosis not present

## 2021-12-11 NOTE — Assessment & Plan Note (Signed)
History of dyslipidemia on statin therapy with lipid profile performed 03/01/2021 revealing total cholesterol 140, LDL 72 and HDL 52.

## 2021-12-11 NOTE — Assessment & Plan Note (Signed)
History of palpitations when I saw her last with event monitor that showed occasional PACs, PVCs and short runs of SVT.  She says she feels these in her neck when she exercises and I have reassured her.

## 2021-12-11 NOTE — Assessment & Plan Note (Signed)
History of essential hypertension a blood pressure measured today at 118/62.  She is on amlodipine and benazepril.

## 2021-12-11 NOTE — Patient Instructions (Signed)
Medication Instructions:  Your physician recommends that you continue on your current medications as directed. Please refer to the Current Medication list given to you today.  *If you need a refill on your cardiac medications before your next appointment, please call your pharmacy*   Testing/Procedures: Your physician has requested that you have a carotid duplex. This test is an ultrasound of the carotid arteries in your neck. It looks at blood flow through these arteries that supply the brain with blood. Allow one hour for this exam. There are no restrictions or special instructions. To be done in June. This procedure is done at [facility].    Follow-Up: At [facility] you and your health needs are our priority.  As part of our continuing mission to provide you with exceptional heart care, we have created designated Provider Care Teams.  These Care Teams include your primary Cardiologist (physician) and Advanced Practice Providers (APPs -  Physician Assistants and Nurse Practitioners) who all work together to provide you with the care you need, when you need it.  We recommend signing up for the patient portal called "MyChart".  Sign up information is provided on this After Visit Summary.  MyChart is used to connect with patients for Virtual Visits (Telemedicine).  Patients are able to view lab/test results, encounter notes, upcoming appointments, etc.  Non-urgent messages can be sent to your provider as well.   To learn more about what you can do with MyChart, go to [email or web address].    Your next appointment:   12 month(s)  The format for your next appointment:   In Person  Provider:   Quay Burow, MD

## 2021-12-11 NOTE — Assessment & Plan Note (Signed)
Carotid Dopplers performed 03/26/2021 showed left clavian artery stenosis with antegrade left vertebral blood flow, an 11 mm upper extremity blood pressure gradient.  The patient denies symptoms of subclavian steal.

## 2021-12-11 NOTE — Progress Notes (Signed)
12/11/2021 Kaitlyn Baker   [birth year]  [id number]  Primary Physician Kaitlyn School, MD Primary Cardiologist: Kaitlyn Harp MD Kaitlyn Baker, [name]  HPI:  Kaitlyn Baker is a 81 y.o.  mildly overweight married Caucasian female, mother of 63, whose husband Kaitlyn Baker  is also a patient of mine. I last saw her   05/16/2021. She has a history of hyperlipidemia, treated hypertension, and family history of heart disease. She was complaining of some atypical chest pain radiating to her back, which is a new finding for her 2 years ago. She had a Myoview stress test performed on May 28, 2012, which was entirely normal, and subsequent to that her symptoms completely resolved.   She had  an episode of shortness of breath last summer which ultimately resolved spontaneously and turned out not to be anything serious.  She did spend time in [place] with her randdaughter several years ago touring [place], [place] in [place].  Her granddaughter graduated [employer] with honors and now is considering going to medical Baker.    Since I saw her 6 months ago she has says that her fatigue is better.  She occasionally gets some palpitations which she feels in her neck when she exercises.  She denies chest pain or shortness of breath.  She did have carotid Dopplers performed 03/26/2021 because of an auscultated bruit that showed patent internal carotid arteries with a left subclavian artery stenosis and a 12 mm upper extremity blood pressure gradient although she had antegrade vertebral filling but no symptoms of subclavian steal.   Current Meds  Medication Sig   acetaminophen (TYLENOL) 500 MG tablet Take 500 mg by mouth daily as needed (pain).   amLODipine-benazepril (LOTREL) 10-20 MG per capsule Take 1 capsule by mouth daily.   aspirin 81 MG tablet Take 81 mg by mouth daily.   Cholecalciferol (VITAMIN D3) 50 MCG (2000 UT) capsule Take 2,000 Units by mouth daily.   levothyroxine  (SYNTHROID, LEVOTHROID) 50 MCG tablet Take 50 mcg by mouth daily.   simvastatin (ZOCOR) 20 MG tablet Take 20 mg by mouth daily.   [DISCONTINUED] pantoprazole (PROTONIX) 40 MG tablet Take 1 tablet (40 mg total) by mouth daily before breakfast. (Patient taking differently: Take 40 mg by mouth daily as needed (abdomal pain).)     Allergies  Allergen Reactions   Methylprednisolone Anaphylaxis and Rash    Injection medication-Severe rash and edema-tongue and vocal cords   Morphine Anaphylaxis   Demerol [Meperidine] Swelling    Patient developed tongue swelling. He was treated with Benadryl and prednisone by PCP and is fine   Versed [Midazolam] Nausea And Vomiting    Hard time coming from under   Moviprep [Peg-Kcl-Nacl-Nasulf-Na Asc-C] Nausea And Vomiting    Severe itching all over, itching in the back of the throat, and lip swelling    Social History   Socioeconomic History   Marital status: Married    Spouse name: Not on file   Number of children: Not on file   Years of education: Not on file   Highest education level: Not on file  Occupational History   Not on file  Tobacco Use   Smoking status: Never   Smokeless tobacco: Never  Substance and Sexual Activity   Alcohol use: Yes    Comment: 1 glass of wine once a week   Drug use: No   Sexual activity: Not on file  Other Topics Concern   Not on file  Social History Narrative   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    Difficulty of Paying Living Expenses: Not hard at all  Food Insecurity: No Food Insecurity   Worried About Charity fundraiser in the Last Year: Never true   Arboriculturist in the Last Year: Never true  Transportation Needs: No Transportation Needs   Lack of Transportation (Medical): No   Lack of Transportation (Non-Medical): No  Physical Activity: Sufficiently Active   Days of Exercise per Week: 7 days   Minutes of Exercise per Session: 30 min  Stress: No  Stress Concern Present   Feeling of Stress : Not at all  Social Connections: Socially Integrated   Frequency of Communication with Friends and Family: More than three times a week   Frequency of Social Gatherings with Friends and Family: More than three times a week   Attends Religious Services: More than 4 times per year   Active Member of [employer] or Organizations: Yes   Attends Archivist Meetings: 1 to 4 times per year   Marital Status: Married  Human resources officer Violence: Not At Risk   Fear of Current or Ex-Partner: No   Emotionally Abused: No   Physically Abused: No   Sexually Abused: No     Review of Systems: General: negative for chills, fever, night sweats or weight changes.  Cardiovascular: negative for chest pain, dyspnea on exertion, edema, orthopnea, palpitations, paroxysmal nocturnal dyspnea or shortness of breath Dermatological: negative for rash Respiratory: negative for cough or wheezing Urologic: negative for hematuria Abdominal: negative for nausea, vomiting, diarrhea, bright red blood per rectum, melena, or hematemesis Neurologic: negative for visual changes, syncope, or dizziness All other systems reviewed and are otherwise negative except as noted above.    Blood pressure 118/62, pulse 74, height 5' 5.5" (1.664 m), weight 180 lb (81.6 kg).  General appearance: alert and no distress Neck: no adenopathy, no JVD, supple, symmetrical, trachea midline, thyroid not enlarged, symmetric, no tenderness/mass/nodules, and left carotid and subclavian Kaitlyn Baker Lungs: clear to auscultation bilaterally Heart: regular rate and rhythm, S1, S2 normal, no murmur, click, rub or gallop Extremities: extremities normal, atraumatic, no cyanosis or edema Pulses: 2+ and symmetric Skin: Skin color, texture, turgor normal. No rashes or lesions Neurologic: Grossly normal  EKG normal sinus rhythm at 74 without ST or T wave changes.  I personally reviewed this EKG  ASSESSMENT AND  PLAN:   HTN (hypertension) History of essential hypertension a blood pressure measured today at 118/62.  She is on amlodipine and benazepril.  Dyslipidemia History of dyslipidemia on statin therapy with lipid profile performed 03/01/2021 revealing total cholesterol 140, LDL 72 and HDL 52.  Palpitations History of palpitations when I saw her last with event monitor that showed occasional PACs, PVCs and short runs of SVT.  She says she feels these in her neck when she exercises and I have reassured her.  Stenosis of left subclavian artery (HCC) Carotid Dopplers performed 03/26/2021 showed left clavian artery stenosis with antegrade left vertebral blood flow, an 11 mm upper extremity blood pressure gradient.  The patient denies symptoms of subclavian steal.     Kaitlyn Harp MD [facility], [facility] 12/11/2021 8:44 AM

## 2022-01-02 DIAGNOSIS — L570 Actinic keratosis: Secondary | ICD-10-CM | POA: Diagnosis not present

## 2022-01-02 DIAGNOSIS — D0471 Carcinoma in situ of skin of right lower limb, including hip: Secondary | ICD-10-CM | POA: Diagnosis not present

## 2022-01-02 DIAGNOSIS — L578 Other skin changes due to chronic exposure to nonionizing radiation: Secondary | ICD-10-CM | POA: Diagnosis not present

## 2022-01-02 DIAGNOSIS — X32XXXD Exposure to sunlight, subsequent encounter: Secondary | ICD-10-CM | POA: Diagnosis not present

## 2022-01-02 DIAGNOSIS — L821 Other seborrheic keratosis: Secondary | ICD-10-CM | POA: Diagnosis not present

## 2022-01-08 DIAGNOSIS — H40013 Open angle with borderline findings, low risk, bilateral: Secondary | ICD-10-CM | POA: Diagnosis not present

## 2022-01-08 DIAGNOSIS — H04123 Dry eye syndrome of bilateral lacrimal glands: Secondary | ICD-10-CM | POA: Diagnosis not present

## 2022-01-08 DIAGNOSIS — H35363 Drusen (degenerative) of macula, bilateral: Secondary | ICD-10-CM | POA: Diagnosis not present
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
## 2022-02-14 DIAGNOSIS — Z85828 Personal history of other malignant neoplasm of skin: Secondary | ICD-10-CM | POA: Diagnosis not present

## 2022-02-14 DIAGNOSIS — Z08 Encounter for follow-up examination after completed treatment for malignant neoplasm: Secondary | ICD-10-CM | POA: Diagnosis not present

## 2022-02-18 DIAGNOSIS — S20229A Contusion of unspecified back wall of thorax, initial encounter: Secondary | ICD-10-CM | POA: Diagnosis not present

## 2022-02-18 DIAGNOSIS — M549 Dorsalgia, unspecified: Secondary | ICD-10-CM | POA: Diagnosis not present

## 2022-03-21 ENCOUNTER — Inpatient Hospital Stay (HOSPITAL_COMMUNITY): Admission: RE | Admit: 2022-03-21 | Payer: Medicare HMO | Source: Ambulatory Visit

## 2022-04-01 ENCOUNTER — Ambulatory Visit (HOSPITAL_COMMUNITY)
Admission: RE | Admit: 2022-04-01 | Discharge: 2022-04-01 | Disposition: A | Discharge status: Home / Self Care | Payer: Medicare HMO | Source: Ambulatory Visit | Attending: Cardiovascular Disease | Admitting: Cardiovascular Disease

## 2022-04-01 DIAGNOSIS — I771 Stricture of artery: Secondary | ICD-10-CM | POA: Diagnosis not present

## 2022-05-14 DIAGNOSIS — S46011A Strain of muscle(s) and tendon(s) of the rotator cuff of right shoulder, initial encounter: Secondary | ICD-10-CM | POA: Diagnosis not present

## 2022-05-29 DIAGNOSIS — E7849 Other hyperlipidemia: Secondary | ICD-10-CM | POA: Diagnosis not present

## 2022-05-29 DIAGNOSIS — R5383 Other fatigue: Secondary | ICD-10-CM | POA: Diagnosis not present

## 2022-05-29 DIAGNOSIS — E063 Autoimmune thyroiditis: Secondary | ICD-10-CM | POA: Diagnosis not present

## 2022-05-29 DIAGNOSIS — I10 Essential (primary) hypertension: Secondary | ICD-10-CM | POA: Diagnosis not present

## 2022-05-29 DIAGNOSIS — Z0001 Encounter for general adult medical examination with abnormal findings: Secondary | ICD-10-CM | POA: Diagnosis not present

## 2022-05-29 DIAGNOSIS — R011 Cardiac murmur, unspecified: Secondary | ICD-10-CM | POA: Diagnosis not present

## 2022-05-29 DIAGNOSIS — E559 Vitamin D deficiency, unspecified: Secondary | ICD-10-CM | POA: Diagnosis not present

## 2022-05-29 DIAGNOSIS — I358 Other nonrheumatic aortic valve disorders: Secondary | ICD-10-CM | POA: Diagnosis not present

## 2022-05-30 ENCOUNTER — Other Ambulatory Visit: Payer: Self-pay | Admitting: Internal Medicine

## 2022-05-30 ENCOUNTER — Other Ambulatory Visit (HOSPITAL_COMMUNITY): Payer: Self-pay | Admitting: Internal Medicine

## 2022-05-30 DIAGNOSIS — R7989 Other specified abnormal findings of blood chemistry: Secondary | ICD-10-CM

## 2022-05-31 DIAGNOSIS — E2749 Other adrenocortical insufficiency: Secondary | ICD-10-CM | POA: Diagnosis not present

## 2022-06-03 ENCOUNTER — Encounter (INDEPENDENT_AMBULATORY_CARE_PROVIDER_SITE_OTHER): Payer: Self-pay | Admitting: *Deleted

## 2022-06-07 ENCOUNTER — Ambulatory Visit (HOSPITAL_COMMUNITY)
Admission: RE | Admit: 2022-06-07 | Discharge: 2022-06-07 | Disposition: A | Discharge status: Home / Self Care | Payer: Medicare HMO | Source: Ambulatory Visit | Attending: Internal Medicine | Admitting: Internal Medicine

## 2022-06-07 DIAGNOSIS — K760 Fatty (change of) liver, not elsewhere classified: Secondary | ICD-10-CM | POA: Diagnosis not present

## 2022-06-07 DIAGNOSIS — R7989 Other specified abnormal findings of blood chemistry: Secondary | ICD-10-CM | POA: Diagnosis not present
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
## 2022-06-13 ENCOUNTER — Inpatient Hospital Stay: Payer: Medicare HMO | Attending: Physician Assistant | Admitting: Physician Assistant

## 2022-06-13 ENCOUNTER — Inpatient Hospital Stay: Payer: Medicare HMO

## 2022-06-13 VITALS — BP 117/67 | HR 75 | Temp 98.5°F | Resp 16 | Ht 65.55 in | Wt 177.7 lb

## 2022-06-13 DIAGNOSIS — D72829 Elevated white blood cell count, unspecified: Secondary | ICD-10-CM | POA: Diagnosis not present

## 2022-06-13 DIAGNOSIS — Z803 Family history of malignant neoplasm of breast: Secondary | ICD-10-CM | POA: Diagnosis not present

## 2022-06-13 DIAGNOSIS — I10 Essential (primary) hypertension: Secondary | ICD-10-CM | POA: Diagnosis not present

## 2022-06-13 LAB — CBC WITH DIFFERENTIAL/PLATELET
Abs Immature Granulocytes: 0.05 10*3/uL (ref 0.00–0.07)
Basophils Absolute: 0.1 10*3/uL (ref 0.0–0.1)
Basophils Relative: 0 %
Eosinophils Absolute: 0.3 10*3/uL (ref 0.0–0.5)
Eosinophils Relative: 2 %
HCT: 43 % (ref 36.0–46.0)
Hemoglobin: 14 g/dL (ref 12.0–15.0)
Immature Granulocytes: 0 %
Lymphocytes Relative: 16 %
Lymphs Abs: 2.4 10*3/uL (ref 0.7–4.0)
MCH: 28.1 pg (ref 26.0–34.0)
MCHC: 32.6 g/dL (ref 30.0–36.0)
MCV: 86.2 fL (ref 80.0–100.0)
Monocytes Absolute: 2 10*3/uL — ABNORMAL HIGH (ref 0.1–1.0)
Monocytes Relative: 14 %
Neutro Abs: 10.1 10*3/uL — ABNORMAL HIGH (ref 1.7–7.7)
Neutrophils Relative %: 68 %
Platelets: 285 10*3/uL (ref 150–400)
RBC: 4.99 MIL/uL (ref 3.87–5.11)
RDW: 14 % (ref 11.5–15.5)
WBC: 14.9 10*3/uL — ABNORMAL HIGH (ref 4.0–10.5)
nRBC: 0 % (ref 0.0–0.2)

## 2022-06-13 LAB — LACTATE DEHYDROGENASE: LDH: 127 U/L (ref 98–192)

## 2022-06-13 NOTE — Patient Instructions (Signed)
[place] [facility] [place] **   You were seen today by Tarri Abernethy PA-C for your elevated white blood cells.  As we discussed, this is most likely related to your recent steroid injection.  We will check some additional labs today.  I will check your blood count again and see you for office visit in 1 month.   ** Thank you for trusting me with your healthcare!  I strive to provide all of my patients with quality care at each visit.  If you receive a survey for this visit, I would be so grateful to you for taking the time to provide feedback.  Thank you in advance!  ~ Aayushi Solorzano                   Dr. Derek Jack   &   Tarri Abernethy, PA-C   - - - - - - - - - - - - - - - - - -    Thank you for choosing [facility] to provide your oncology and hematology care.  To afford each patient quality time with our provider, please arrive at least 15 minutes before your scheduled appointment time.   If you have a lab appointment with the [facility] please come in thru the Main Entrance and check in at the main information desk.  You need to re-schedule your appointment should you arrive 10 or more minutes late.  We strive to give you quality time with our providers, and arriving late affects you and other patients whose appointments are after yours.  Also, if you no show three or more times for appointments you may be dismissed from the clinic at the providers discretion.     Again, thank you for choosing [facility].  Our hope is that these requests will decrease the amount of time that you wait before being seen by our physicians.       _____________________________________________________________  Should you have questions after your visit to [facility], please contact our office at [phone] and follow the prompts.  Our office hours are 8:00 a.m. and 4:30  p.m. Monday - Friday.  Please note that voicemails left after 4:00 p.m. may not be returned until the following business day.  We are closed weekends and major holidays.  You do have access to a nurse 24-7, just call the main number to the clinic [phone] and do not press any options, hold on the line and a nurse will answer the phone.    For prescription refill requests, have your pharmacy contact our office and allow 72 hours.

## 2022-06-18 LAB — FLOW CYTOMETRY

## 2022-06-19 ENCOUNTER — Ambulatory Visit: Payer: Medicare HMO | Attending: Cardiovascular Disease | Admitting: Cardiovascular Disease

## 2022-06-19 ENCOUNTER — Encounter: Payer: Self-pay | Admitting: Cardiovascular Disease

## 2022-06-19 VITALS — BP 130/74 | HR 71 | Ht 66.0 in | Wt 177.0 lb

## 2022-06-19 DIAGNOSIS — I771 Stricture of artery: Secondary | ICD-10-CM

## 2022-06-19 DIAGNOSIS — E785 Hyperlipidemia, unspecified: Secondary | ICD-10-CM | POA: Diagnosis not present

## 2022-06-19 DIAGNOSIS — I10 Essential (primary) hypertension: Secondary | ICD-10-CM

## 2022-06-19 DIAGNOSIS — R002 Palpitations: Secondary | ICD-10-CM

## 2022-06-19 NOTE — Assessment & Plan Note (Signed)
History of palpitations with event monitor performed a year ago that showed PACs, PVCs and short runs of SVT.  She does feel a thumping in her neck which may be her PVCs but does not wish to be on a beta-blocker at this time because of side effects.

## 2022-06-19 NOTE — Patient Instructions (Signed)
Medication Instructions:  Your physician recommends that you continue on your current medications as directed. Please refer to the Current Medication list given to you today.  *If you need a refill on your cardiac medications before your next appointment, please call your pharmacy*   Testing/Procedures: Your physician has requested that you have a carotid duplex. This test is an ultrasound of the carotid arteries in your neck. It looks at blood flow through these arteries that supply the brain with blood. Allow one hour for this exam. There are no restrictions or special instructions. To be done in June 2024. This procedure will be done at [address], [address]    Follow-Up: At [facility], you and your health needs are our priority.  As part of our continuing mission to provide you with exceptional heart care, we have created designated Provider Care Teams.  These Care Teams include your primary Cardiologist (physician) and Advanced Practice Providers (APPs -  Physician Assistants and Nurse Practitioners) who all work together to provide you with the care you need, when you need it.  We recommend signing up for the patient portal called "MyChart".  Sign up information is provided on this After Visit Summary.  MyChart is used to connect with patients for Virtual Visits (Telemedicine).  Patients are able to view lab/test results, encounter notes, upcoming appointments, etc.  Non-urgent messages can be sent to your provider as well.   To learn more about what you can do with MyChart, go to [email or web address].    Your next appointment:   June 2024  The format for your next appointment:   In Person  Provider:   Quay Burow, MD

## 2022-06-19 NOTE — Assessment & Plan Note (Signed)
History of hyperlipidemia on statin therapy lipid profile performed 05/30/2022 revealing total cholesterol 156, LDL of 83 and HDL 56.

## 2022-06-19 NOTE — Assessment & Plan Note (Signed)
History of left subclavian artery stenosis with Doppler study performed 04/01/2022 revealing only a 6 mm upper extremity blood pressure differential.  Her left subclavian artery velocities were 258 with antegrade vertebral blood flow.  She does have a loud bruit but is asymptomatic.  We will continue to follow this noninvasively on annual basis.

## 2022-06-19 NOTE — Progress Notes (Signed)
06/19/2022 Kaitlyn Baker   1940/10/24  [id number]  Primary Physician Redmond School, MD Primary Cardiologist: Lorretta Harp MD Kaitlyn Baker, [name]  HPI:  Kaitlyn Baker is a 81 y.o.  mildly overweight married Caucasian female, mother of 69, whose husband Kaitlyn Baker  is also a patient of mine. I last saw her 12/11/2021. She has a history of hyperlipidemia, treated hypertension, and family history of heart disease. She was complaining of some atypical chest pain radiating to her back, which is a new finding for her 2 years ago. She had a Myoview stress test performed on May 28, 2012, which was entirely normal, and subsequent to that her symptoms completely resolved.   She had  an episode of shortness of breath last summer which ultimately resolved spontaneously and turned out not to be anything serious.  She did spend time in [place] with her randdaughter several years ago touring [place], [place] in [place].  Her granddaughter graduated [employer] with honors and now is considering going to medical school.      She did have carotid Dopplers performed 03/26/2021 because of an auscultated bruit that showed patent internal carotid arteries with a left subclavian artery stenosis and a 12 mm upper extremity blood pressure gradient although she had antegrade vertebral filling but no symptoms of subclavian steal.  Since I saw her 6 months ago she has done well.  She does feels slightly fatigued.  She is referred back because of an auscultated murmur by her primary care physician, Dr. Gerarda Fraction .  She had a 2D echocardiogram performed 04/11/2021 that was essentially normal without valvular abnormalities.  Carotid Dopplers recently performed did show left subclavian artery stenosis although she only had a 6 mm upper extremity blood pressure differential with antegrade vertebral flow bilaterally.  She is completely asymptomatic from this.  It is possible that the bruit which I  auscultated radiates to her upper chest mimicking a murmur versus an innocent flow murmur.  She also experiences some palpitations in her neck but did have an event monitor performed a year ago that showed PVCs, PACs and short runs of SVT.  She does not wish to be on a beta-blocker.   Current Meds  Medication Sig   acetaminophen (TYLENOL) 500 MG tablet Take 500 mg by mouth daily as needed (pain).   amLODipine-benazepril (LOTREL) 10-20 MG per capsule Take 1 capsule by mouth daily.   aspirin 81 MG tablet Take 81 mg by mouth daily.   Cholecalciferol (VITAMIN D3) 50 MCG (2000 UT) capsule Take 2,000 Units by mouth daily.   levothyroxine (SYNTHROID, LEVOTHROID) 50 MCG tablet Take 50 mcg by mouth daily.   simvastatin (ZOCOR) 20 MG tablet Take 20 mg by mouth daily.     Allergies  Allergen Reactions   Methylprednisolone Anaphylaxis and Rash    Injection medication-Severe rash and edema-tongue and vocal cords   Morphine Anaphylaxis   Demerol [Meperidine] Swelling    Patient developed tongue swelling. He was treated with Benadryl and prednisone by PCP and is fine   Versed [Midazolam] Nausea And Vomiting    Hard time coming from under   Moviprep [Peg-Kcl-Nacl-Nasulf-Na Asc-C] Nausea And Vomiting    Severe itching all over, itching in the back of the throat, and lip swelling    Social History   Socioeconomic History   Marital status: Married    Spouse name: Not on file   Number of children: Not on file   Years of  education: Not on file   Highest education level: Not on file  Occupational History   Not on file  Tobacco Use   Smoking status: Never   Smokeless tobacco: Never  Substance and Sexual Activity   Alcohol use: Yes    Comment: 1 glass of wine once a week   Drug use: No   Sexual activity: Not on file  Other Topics Concern   Not on file  Social History Narrative   Retired Therapist, sports   Social Determinants of Health   Financial Resource Strain: Low Risk  (04/05/2021)   Overall  Financial Resource Strain (CARDIA)    Difficulty of Paying Living Expenses: Not hard at all  Food Insecurity: No Food Insecurity (04/05/2021)   Hunger Vital Sign    Worried About Running Out of Food in the Last Year: Never true    Smock in the Last Year: Never true  Transportation Needs: No Transportation Needs (04/05/2021)   PRAPARE - Hydrologist (Medical): No    Lack of Transportation (Non-Medical): No  Physical Activity: Sufficiently Active (04/05/2021)   Exercise Vital Sign    Days of Exercise per Week: 7 days    Minutes of Exercise per Session: 30 min  Stress: No Stress Concern Present (04/05/2021)   [place]    Feeling of Stress : Not at all  Social Connections: [place] (04/05/2021)   Social Connection and Isolation Panel [NHANES]    Frequency of Communication with Friends and Family: More than three times a week    Frequency of Social Gatherings with Friends and Family: More than three times a week    Attends Religious Services: More than 4 times per year    Active Member of [employer] or Organizations: Yes    Attends Archivist Meetings: 1 to 4 times per year    Marital Status: Married  Human resources officer Violence: Not At Risk (04/05/2021)   Humiliation, Afraid, Rape, and Kick questionnaire    Fear of Current or Ex-Partner: No    Emotionally Abused: No    Physically Abused: No    Sexually Abused: No     Review of Systems: General: negative for chills, fever, night sweats or weight changes.  Cardiovascular: negative for chest pain, dyspnea on exertion, edema, orthopnea, palpitations, paroxysmal nocturnal dyspnea or shortness of breath Dermatological: negative for rash Respiratory: negative for cough or wheezing Urologic: negative for hematuria Abdominal: negative for nausea, vomiting, diarrhea, bright red blood per rectum, melena, or  hematemesis Neurologic: negative for visual changes, syncope, or dizziness All other systems reviewed and are otherwise negative except as noted above.    Blood pressure 130/74, pulse 71, height '5\' 6"'$  (1.676 m), weight 177 lb (80.3 kg).  General appearance: alert and no distress Neck: no adenopathy, no carotid bruit, no JVD, supple, symmetrical, trachea midline, and thyroid not enlarged, symmetric, no tenderness/mass/nodules Lungs: clear to auscultation bilaterally Heart: Soft outflow tract murmur which may be left subclavian bruit which radiates. Extremities: extremities normal, atraumatic, no cyanosis or edema Pulses: 2+ and symmetric Skin: Skin color, texture, turgor normal. No rashes or lesions Neurologic: Grossly normal  EKG sinus rhythm at 71 without ST or T wave changes.  Personally reviewed this EKG.  ASSESSMENT AND PLAN:   HTN (hypertension) History of essential hypertension a blood pressure measured today at 130/74.  She is on Lotrel.  Dyslipidemia History of hyperlipidemia on statin therapy lipid profile  performed 05/30/2022 revealing total cholesterol 156, LDL of 83 and HDL 56.  Palpitations History of palpitations with event monitor performed a year ago that showed PACs, PVCs and short runs of SVT.  She does feel a thumping in her neck which may be her PVCs but does not wish to be on a beta-blocker at this time because of side effects.  Stenosis of left subclavian artery (HCC) History of left subclavian artery stenosis with Doppler study performed 04/01/2022 revealing only a 6 mm upper extremity blood pressure differential.  Her left subclavian artery velocities were 258 with antegrade vertebral blood flow.  She does have a loud bruit but is asymptomatic.  We will continue to follow this noninvasively on annual basis.     Lorretta Harp MD FACP,FACC,FAHA, [facility] 06/19/2022 10:36 AM

## 2022-06-19 NOTE — Assessment & Plan Note (Signed)
History of essential hypertension a blood pressure measured today at 130/74.  She is on Lotrel.

## 2022-07-15 ENCOUNTER — Encounter (INDEPENDENT_AMBULATORY_CARE_PROVIDER_SITE_OTHER): Payer: Self-pay | Admitting: Gastroenterology

## 2022-07-16 NOTE — Progress Notes (Deleted)
NO SHOW

## 2022-07-17 ENCOUNTER — Inpatient Hospital Stay: Payer: Medicare HMO | Attending: Physician Assistant | Admitting: Physician Assistant

## 2022-07-17 ENCOUNTER — Inpatient Hospital Stay: Payer: Medicare HMO

## 2022-07-17 DIAGNOSIS — D72829 Elevated white blood cell count, unspecified: Secondary | ICD-10-CM | POA: Insufficient documentation

## 2022-07-19 ENCOUNTER — Inpatient Hospital Stay: Payer: Medicare HMO

## 2022-07-19 DIAGNOSIS — Z23 Encounter for immunization: Secondary | ICD-10-CM | POA: Diagnosis not present

## 2022-07-19 DIAGNOSIS — D72829 Elevated white blood cell count, unspecified: Secondary | ICD-10-CM

## 2022-07-19 LAB — CBC WITH DIFFERENTIAL/PLATELET
Abs Immature Granulocytes: 0.05 10*3/uL (ref 0.00–0.07)
Basophils Absolute: 0.1 10*3/uL (ref 0.0–0.1)
Basophils Relative: 1 %
Eosinophils Absolute: 0.2 10*3/uL (ref 0.0–0.5)
Eosinophils Relative: 2 %
HCT: 42 % (ref 36.0–46.0)
Hemoglobin: 13.9 g/dL (ref 12.0–15.0)
Immature Granulocytes: 0 %
Lymphocytes Relative: 16 %
Lymphs Abs: 2.2 10*3/uL (ref 0.7–4.0)
MCH: 28.3 pg (ref 26.0–34.0)
MCHC: 33.1 g/dL (ref 30.0–36.0)
MCV: 85.4 fL (ref 80.0–100.0)
Monocytes Absolute: 1.8 10*3/uL — ABNORMAL HIGH (ref 0.1–1.0)
Monocytes Relative: 14 %
Neutro Abs: 8.8 10*3/uL — ABNORMAL HIGH (ref 1.7–7.7)
Neutrophils Relative %: 67 %
Platelets: 266 10*3/uL (ref 150–400)
RBC: 4.92 MIL/uL (ref 3.87–5.11)
RDW: 14.3 % (ref 11.5–15.5)
WBC: 13.1 10*3/uL — ABNORMAL HIGH (ref 4.0–10.5)
nRBC: 0 % (ref 0.0–0.2)
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
## 2022-07-31 ENCOUNTER — Inpatient Hospital Stay: Payer: Medicare HMO | Attending: Physician Assistant | Admitting: Physician Assistant

## 2022-07-31 VITALS — BP 105/67 | HR 72 | Temp 97.2°F | Resp 18 | Ht 66.0 in | Wt 179.9 lb

## 2022-07-31 DIAGNOSIS — Z803 Family history of malignant neoplasm of breast: Secondary | ICD-10-CM | POA: Diagnosis not present

## 2022-07-31 DIAGNOSIS — I10 Essential (primary) hypertension: Secondary | ICD-10-CM | POA: Diagnosis not present

## 2022-07-31 DIAGNOSIS — Z79890 Hormone replacement therapy (postmenopausal): Secondary | ICD-10-CM | POA: Insufficient documentation

## 2022-07-31 DIAGNOSIS — E039 Hypothyroidism, unspecified: Secondary | ICD-10-CM | POA: Insufficient documentation

## 2022-07-31 DIAGNOSIS — D72829 Elevated white blood cell count, unspecified: Secondary | ICD-10-CM | POA: Insufficient documentation

## 2022-07-31 DIAGNOSIS — Z79899 Other long term (current) drug therapy: Secondary | ICD-10-CM | POA: Insufficient documentation

## 2022-07-31 DIAGNOSIS — E785 Hyperlipidemia, unspecified: Secondary | ICD-10-CM | POA: Insufficient documentation

## 2022-07-31 DIAGNOSIS — D72821 Monocytosis (symptomatic): Secondary | ICD-10-CM | POA: Diagnosis not present

## 2022-07-31 DIAGNOSIS — Z7982 Long term (current) use of aspirin: Secondary | ICD-10-CM | POA: Insufficient documentation

## 2022-07-31 NOTE — Patient Instructions (Signed)
[facility] [place] **   You were seen today by Tarri Abernethy PA-C for your elevated white blood cells.   Your white blood cells remain mildly elevated (specifically your neutrophils and monocytes), but they have trended downward. We do not yet know the exact cause of your elevated white blood cells, but in the absence of other "red flag" symptoms, we will continue to monitor and consider additional testing if needed in the future.  FOLLOW-UP APPOINTMENT: We will check labs only in 2 months.  We will check labs and see you for follow-up visit in 4 months.  ** Thank you for trusting me with your healthcare!  I strive to provide all of my patients with quality care at each visit.  If you receive a survey for this visit, I would be so grateful to you for taking the time to provide feedback.  Thank you in advance!  ~ Brielynn Sekula                   Dr. Derek Jack   &   Tarri Abernethy, PA-C   - - - - - - - - - - - - - - - - - -    Thank you for choosing [facility] to provide your oncology and hematology care.  To afford each patient quality time with our provider, please arrive at least 15 minutes before your scheduled appointment time.   If you have a lab appointment with the [facility] please come in thru the Main Entrance and check in at the main information desk.  You need to re-schedule your appointment should you arrive 10 or more minutes late.  We strive to give you quality time with our providers, and arriving late affects you and other patients whose appointments are after yours.  Also, if you no show three or more times for appointments you may be dismissed from the clinic at the providers discretion.     Again, thank you for choosing [facility].  Our hope is that these requests will decrease the amount of time that you wait before being seen by our  physicians.       _____________________________________________________________  Should you have questions after your visit to [facility], please contact our office at [phone] and follow the prompts.  Our office hours are 8:00 a.m. and 4:30 p.m. Monday - Friday.  Please note that voicemails left after 4:00 p.m. may not be returned until the following business day.  We are closed weekends and major holidays.  You do have access to a nurse 24-7, just call the main number to the clinic [phone] and do not press any options, hold on the line and a nurse will answer the phone.    For prescription refill requests, have your pharmacy contact our office and allow 72 hours.

## 2022-08-08 ENCOUNTER — Ambulatory Visit: Payer: Medicare HMO | Admitting: Gastroenterology

## 2022-08-08 ENCOUNTER — Ambulatory Visit (INDEPENDENT_AMBULATORY_CARE_PROVIDER_SITE_OTHER): Payer: Medicare HMO | Admitting: Gastroenterology

## 2022-08-22 DIAGNOSIS — E2749 Other adrenocortical insufficiency: Secondary | ICD-10-CM | POA: Diagnosis not present

## 2022-09-30 ENCOUNTER — Inpatient Hospital Stay: Payer: Medicare HMO | Attending: Physician Assistant

## 2022-09-30 ENCOUNTER — Ambulatory Visit (INDEPENDENT_AMBULATORY_CARE_PROVIDER_SITE_OTHER): Payer: Medicare HMO | Admitting: Gastroenterology

## 2022-09-30 ENCOUNTER — Encounter (INDEPENDENT_AMBULATORY_CARE_PROVIDER_SITE_OTHER): Payer: Self-pay | Admitting: Gastroenterology

## 2022-09-30 DIAGNOSIS — D72821 Monocytosis (symptomatic): Secondary | ICD-10-CM

## 2022-09-30 DIAGNOSIS — K760 Fatty (change of) liver, not elsewhere classified: Secondary | ICD-10-CM | POA: Diagnosis not present

## 2022-09-30 DIAGNOSIS — D72829 Elevated white blood cell count, unspecified: Secondary | ICD-10-CM | POA: Diagnosis not present

## 2022-09-30 LAB — CBC WITH DIFFERENTIAL/PLATELET
Abs Immature Granulocytes: 0.06 10*3/uL (ref 0.00–0.07)
Basophils Absolute: 0.1 10*3/uL (ref 0.0–0.1)
Basophils Relative: 1 %
Eosinophils Absolute: 0.2 10*3/uL (ref 0.0–0.5)
Eosinophils Relative: 2 %
HCT: 42.1 % (ref 36.0–46.0)
Hemoglobin: 13.3 g/dL (ref 12.0–15.0)
Immature Granulocytes: 1 %
Lymphocytes Relative: 19 %
Lymphs Abs: 2.4 10*3/uL (ref 0.7–4.0)
MCH: 27.4 pg (ref 26.0–34.0)
MCHC: 31.6 g/dL (ref 30.0–36.0)
MCV: 86.6 fL (ref 80.0–100.0)
Monocytes Absolute: 1.6 10*3/uL — ABNORMAL HIGH (ref 0.1–1.0)
Monocytes Relative: 13 %
Neutro Abs: 8.3 10*3/uL — ABNORMAL HIGH (ref 1.7–7.7)
Neutrophils Relative %: 64 %
Platelets: 250 10*3/uL (ref 150–400)
RBC: 4.86 MIL/uL (ref 3.87–5.11)
RDW: 13.5 % (ref 11.5–15.5)
WBC: 12.6 10*3/uL — ABNORMAL HIGH (ref 4.0–10.5)
nRBC: 0 % (ref 0.0–0.2)

## 2022-09-30 LAB — LACTATE DEHYDROGENASE: LDH: 141 U/L (ref 98–192)

## 2022-09-30 NOTE — Progress Notes (Signed)
Kaitlyn Baker, M.D. Gastroenterology & Hepatology [facility] [address] [place], [place] [address]  Primary Care Physician: [facility], [place] [address]  I will communicate my assessment and recommendations to the referring MD via EMR.  Problems: Gilbert's syndrome  History of Present Illness: Kaitlyn Baker is a 81 y.o. female with PMH HLD, HTN, hypothyrodism, rheumatic fever,  who presents for follow up of elevated bilirubin.  The patient was last seen on 08/21/2021. At that time, the patient was advised to start pantoprazole 40 mg every day for epigastric pain and was scheduled for a colonoscopy with findings described below.  States that she was advised to come to our clinic as she was found to have a T Bili in the 2s range. She reports that she has been told she had Gilbert's syndrome since the 1970s and her bilirubin has fluctuated through the years with max numbers up to low 3s. The patient denies having any nausea, vomiting, fever, chills, hematochezia, melena, hematemesis, abdominal distention, abdominal pain, diarrhea, jaundice, pruritus or weight loss.  Most recent abdominal ultrasound from 06/07/2022 showed fatty liver without any other abnormalities.  Most recent CMP from 10/31/2021 showed a total bilirubin of 0.9, AST 16, ALT 19, albumin 4.1, alkaline phosphatase 78, creatinine 0.75 normal electrolytes.  Last Colonoscopy: 09/20/2021 - Melanosis in the colon. - Diverticulosis in the sigmoid colon. - External hemorrhoids.  Past Medical History: Past Medical History:  Diagnosis Date   Dyslipidemia    History of cardiac monitoring    HTN (hypertension)    Hypothyroidism    Rheumatic fever    as a child    Past Surgical History: Past Surgical History:  Procedure Laterality Date   COLONOSCOPY N/A 02/02/2015   Procedure: COLONOSCOPY;  Surgeon: Rogene Houston, MD;  [facility];  Service: Endoscopy;  Laterality: N/A;  930   COLONOSCOPY N/A 09/20/2021   Procedure: COLONOSCOPY;  Surgeon: Rogene Houston, MD;  [facility];  Service: Endoscopy;  Laterality: N/A;  10:45   ROTATOR CUFF REPAIR  1991   TUBAL LIGATION  1973    Family History: Family History  Problem Relation Age of Onset   Coronary artery disease Father 1       cardiac arrest   Diabetes Father    Breast cancer Paternal Grandmother     Social History: Social History   Tobacco Use  Smoking Status Never  Smokeless Tobacco Never   Social History   Substance and Sexual Activity  Alcohol Use Yes   Comment: 1 glass of wine once a week   Social History   Substance and Sexual Activity  Drug Use No    Allergies: Allergies  Allergen Reactions   Methylprednisolone Anaphylaxis and Rash    Injection medication-Severe rash and edema-tongue and vocal cords   Morphine Anaphylaxis   Demerol [Meperidine] Swelling    Patient developed tongue swelling. He was treated with Benadryl and prednisone by PCP and is fine   Versed [Midazolam] Nausea And Vomiting    Hard time coming from under   Moviprep [Peg-Kcl-Nacl-Nasulf-Na Asc-C] Nausea And Vomiting    Severe itching all over, itching in the back of the throat, and lip swelling    Medications: Current Outpatient Medications  Medication Sig Dispense Refill   acetaminophen (TYLENOL) 500 MG tablet Take 500 mg by mouth daily as needed (pain).     amLODipine-benazepril (LOTREL) 10-20 MG per capsule Take 1 capsule  by mouth daily.     aspirin 81 MG tablet Take 81 mg by mouth daily.     Cholecalciferol (VITAMIN D3) 50 MCG (2000 UT) capsule Take 2,000 Units by mouth daily.     levothyroxine (SYNTHROID, LEVOTHROID) 50 MCG tablet Take 50 mcg by mouth daily.     simvastatin (ZOCOR) 20 MG tablet Take 20 mg by mouth daily.     zinc gluconate 50 MG tablet Take 50 mg by mouth daily.     No current facility-administered medications for this  visit.    Review of Systems: GENERAL: negative for malaise, night sweats HEENT: No changes in hearing or vision, no nose bleeds or other nasal problems. NECK: Negative for lumps, goiter, pain and significant neck swelling RESPIRATORY: Negative for cough, wheezing CARDIOVASCULAR: Negative for chest pain, leg swelling, palpitations, orthopnea GI: SEE HPI MUSCULOSKELETAL: Negative for joint pain or swelling, back pain, and muscle pain. SKIN: Negative for lesions, rash PSYCH: Negative for sleep disturbance, mood disorder and recent psychosocial stressors. HEMATOLOGY Negative for prolonged bleeding, bruising easily, and swollen nodes. ENDOCRINE: Negative for cold or heat intolerance, polyuria, polydipsia and goiter. NEURO: negative for tremor, gait imbalance, syncope and seizures. The remainder of the review of systems is noncontributory.   Physical Exam: BP 119/77 (BP Location: Left Arm, Patient Position: Sitting, Cuff Size: Large)   Pulse 78   Temp (!) 97.1 F (36.2 C) (Temporal)   Ht '5\' 6"'$  (1.676 m)   Wt 182 lb 4.8 oz (82.7 kg)   BMI 29.42 kg/m  GENERAL: The patient is AO x3, in no acute distress. HEENT: Head is normocephalic and atraumatic. EOMI are intact. Mouth is well hydrated and without lesions. NECK: Supple. No masses LUNGS: Clear to auscultation. No presence of rhonchi/wheezing/rales. Adequate chest expansion HEART: RRR, normal s1 and s2. ABDOMEN: Soft, nontender, no guarding, no peritoneal signs, and nondistended. BS +. No masses. EXTREMITIES: Without any cyanosis, clubbing, rash, lesions or edema. NEUROLOGIC: AOx3, no focal motor deficit. SKIN: no jaundice, no rashes  Imaging/Labs: as above  I personally reviewed and interpreted the available labs, imaging and endoscopic files.  Impression and Plan: Kaitlyn Baker is a 81 y.o. female with PMH HLD, HTN, hypothyrodism, rheumatic fever,  who presents for follow up of elevated bilirubin.  Patient has been completely  asymptomatic.  She reports a chronic history of elevated total bilirubin fluctuation.  He has been told several times in the past this was related to Gilbert's syndrome.  Notably, she has recent ultrasound that did not show any biliary obstruction, had presence of fatty liver but had normal aminotransferases.  I agree that her abnormal bilirubin is related to Gilbert's syndrome and no further workup is warranted.  Will request most recent labs from PCP.  If tolerating is more than 4.0, we will proceed with further workup.  Patient was reassured about the benign etiology of her abnormal bilirubin.  - Request labs from PCP  All questions were answered.      Kaitlyn Peppers, MD Gastroenterology and Hepatology [facility]

## 2022-10-07 DIAGNOSIS — Z0001 Encounter for general adult medical examination with abnormal findings: Secondary | ICD-10-CM | POA: Diagnosis not present

## 2022-10-07 DIAGNOSIS — E6609 Other obesity due to excess calories: Secondary | ICD-10-CM | POA: Diagnosis not present

## 2022-10-07 DIAGNOSIS — M5136 Other intervertebral disc degeneration, lumbar region: Secondary | ICD-10-CM | POA: Diagnosis not present

## 2022-10-07 DIAGNOSIS — E039 Hypothyroidism, unspecified: Secondary | ICD-10-CM | POA: Diagnosis not present

## 2022-10-07 DIAGNOSIS — I771 Stricture of artery: Secondary | ICD-10-CM | POA: Diagnosis not present

## 2022-10-07 DIAGNOSIS — Z1331 Encounter for screening for depression: Secondary | ICD-10-CM | POA: Diagnosis not present

## 2022-10-07 DIAGNOSIS — Z6830 Body mass index (BMI) 30.0-30.9, adult: Secondary | ICD-10-CM | POA: Diagnosis not present

## 2022-10-08 ENCOUNTER — Other Ambulatory Visit: Payer: Self-pay | Admitting: Obstetrics and Gynecology

## 2022-10-08 DIAGNOSIS — Z1231 Encounter for screening mammogram for malignant neoplasm of breast: Secondary | ICD-10-CM

## 2022-11-29 ENCOUNTER — Other Ambulatory Visit: Payer: Self-pay

## 2022-11-29 DIAGNOSIS — H40013 Open angle with borderline findings, low risk, bilateral: Secondary | ICD-10-CM | POA: Diagnosis not present

## 2022-11-29 DIAGNOSIS — H5213 Myopia, bilateral: Secondary | ICD-10-CM | POA: Diagnosis not present

## 2022-11-29 DIAGNOSIS — H04123 Dry eye syndrome of bilateral lacrimal glands: Secondary | ICD-10-CM | POA: Diagnosis not present

## 2022-11-29 DIAGNOSIS — H0100A Unspecified blepharitis right eye, upper and lower eyelids: Secondary | ICD-10-CM | POA: Diagnosis not present

## 2022-11-29 DIAGNOSIS — H26491 Other secondary cataract, right eye: Secondary | ICD-10-CM | POA: Diagnosis not present

## 2022-11-29 DIAGNOSIS — H52203 Unspecified astigmatism, bilateral: Secondary | ICD-10-CM | POA: Diagnosis not present

## 2022-11-29 DIAGNOSIS — D72829 Elevated white blood cell count, unspecified: Secondary | ICD-10-CM

## 2022-11-29 DIAGNOSIS — H524 Presbyopia: Secondary | ICD-10-CM | POA: Diagnosis not present

## 2022-11-29 DIAGNOSIS — H0100B Unspecified blepharitis left eye, upper and lower eyelids: Secondary | ICD-10-CM | POA: Diagnosis not present

## 2022-12-02 ENCOUNTER — Inpatient Hospital Stay: Payer: Medicare HMO | Attending: Physician Assistant

## 2022-12-02 ENCOUNTER — Ambulatory Visit
Admission: RE | Admit: 2022-12-02 | Discharge: 2022-12-02 | Disposition: A | Discharge status: Home / Self Care | Payer: Medicare HMO | Source: Ambulatory Visit | Attending: Obstetrics and Gynecology | Admitting: Obstetrics and Gynecology

## 2022-12-02 DIAGNOSIS — Z1231 Encounter for screening mammogram for malignant neoplasm of breast: Secondary | ICD-10-CM

## 2022-12-02 DIAGNOSIS — Z803 Family history of malignant neoplasm of breast: Secondary | ICD-10-CM | POA: Diagnosis not present

## 2022-12-02 DIAGNOSIS — D72829 Elevated white blood cell count, unspecified: Secondary | ICD-10-CM | POA: Diagnosis not present

## 2022-12-02 DIAGNOSIS — D72821 Monocytosis (symptomatic): Secondary | ICD-10-CM | POA: Insufficient documentation

## 2022-12-02 DIAGNOSIS — I10 Essential (primary) hypertension: Secondary | ICD-10-CM | POA: Insufficient documentation

## 2022-12-02 LAB — CBC WITH DIFFERENTIAL/PLATELET
Abs Immature Granulocytes: 0.04 10*3/uL (ref 0.00–0.07)
Basophils Absolute: 0.1 10*3/uL (ref 0.0–0.1)
Basophils Relative: 1 %
Eosinophils Absolute: 0.3 10*3/uL (ref 0.0–0.5)
Eosinophils Relative: 2 %
HCT: 42.8 % (ref 36.0–46.0)
Hemoglobin: 13.8 g/dL (ref 12.0–15.0)
Immature Granulocytes: 0 %
Lymphocytes Relative: 25 %
Lymphs Abs: 3.3 10*3/uL (ref 0.7–4.0)
MCH: 27.3 pg (ref 26.0–34.0)
MCHC: 32.2 g/dL (ref 30.0–36.0)
MCV: 84.6 fL (ref 80.0–100.0)
Monocytes Absolute: 1.6 10*3/uL — ABNORMAL HIGH (ref 0.1–1.0)
Monocytes Relative: 13 %
Neutro Abs: 7.8 10*3/uL — ABNORMAL HIGH (ref 1.7–7.7)
Neutrophils Relative %: 59 %
Platelets: 250 10*3/uL (ref 150–400)
RBC: 5.06 MIL/uL (ref 3.87–5.11)
RDW: 13.8 % (ref 11.5–15.5)
WBC: 13.1 10*3/uL — ABNORMAL HIGH (ref 4.0–10.5)
nRBC: 0 % (ref 0.0–0.2)

## 2022-12-02 LAB — LACTATE DEHYDROGENASE: LDH: 141 U/L (ref 98–192)

## 2022-12-07 NOTE — Progress Notes (Signed)
Kaitlyn Baker, [place] [address]   CLINIC:  Medical Oncology/Hematology  PCP:  [facility], [place] [id number] [phone]   REASON FOR VISIT:  Follow-up for leukocytosis with monocytosis  PRIOR THERAPY: None  CURRENT THERAPY: Surveillance  INTERVAL HISTORY:   Kaitlyn Baker 82 y.o. female returns for routine follow-up of her leukocytosis with monocytosis.  She was last seen by Tarri Abernethy PA-C on 07/31/2022.  At today's visit, she reports feeling well.  No recent hospitalizations, surgeries, or changes in baseline health status.  She denies any abnormal fatigue.  Her previous concerns about weight loss have resolved and her weight has been stable.  She denies any unexplained fevers, chills, or drenching night sweats.   She denies any frequent infections.   She has not had any systemic steroids since steroid injection to her shoulder on 05/14/2022.  She has not noticed any new lumps or bumps.  She denies early satiety or abdominal fullness.  She has 85% energy and 100% appetite. She endorses that she is maintaining a stable weight.   ASSESSMENT & PLAN:  1.  Neutrophilic and MONOCYTIC leukocytosis: - CBC on 03/05/2021 with white count 11.1 (differential neutrophils 61%, lymphocytes 24%, monocytes 13%), absolute monocyte count of 1.5.  Prior to that her white count was 11.6 with ANC of 7.2 and absolute monocyte count of 1.5. - Work-up unremarkable (04/06/2021):  JAK2, CALR, and MPL were negative; BCR/ABL negative; rheumatoid factor and ANA negative; LDH, CRP and ESR within normal limits - She is a lifelong non-smoker.  No history of splenectomy.  No known autoimmune or inflammatory conditions.   - No splenomegaly or lymphadenopathy appreciated on exam - No chronic systemic steroids.  Received steroid injection into her shoulder on 05/14/2022  - She denies any B symptoms or recent infections. - Lab  trends: (10/31/2021): Normal WBC 9.7, persistently elevated monocytes 1.3 (05/30/2022 - drawn by PCP 2 weeks after steroid injection): WBC 18.1, with ANC 12.1 and monocytes 2.3 (06/13/2022): WBC trending down at 14.9/ANC 10.1/monocytes 2.0.  LDH normal. (07/19/2022): WBC 13.1, ANC 8.8, monocytes 1.8 - Most recent CBC (12/02/2022): WBC 13.1, ANC 7.8, monocytes 1.6 (13% of WBC) - DIFFERENTIAL DIAGNOSIS for persistent monocytosis includes CMML vs reactive monocytosis - Discussed with patient that since she is currently asymptomatic we can continue active surveillance or obtain additional workup (myeloid panel and/or bone marrow biopsy) if she prefers. If any progressive monocytosis, WBC >15, cytopenias, or constitutional symptoms would absolutely recommend bone marrow biopsy for risk stratification and possible treatment - PLAN: Discussed with Dr. Delton Coombes, who agrees with the above.  We will continue to monitor closely and consider bone marrow biopsy if any clinical concern for CMML (as in the case of cytopenias or constitutional symptoms) - We will see if her insurance will cover myeloid gene panel - CBC only in 2 months.  We will repeat CBC and see for follow-up visit in 4 months.    2.  Social/family history: - She has Gilbert's disease -- She is a retired Equities trader.  Never smoker.  No exposure to chemicals. - No family history of leukemia.  Paternal grandmother had breast cancer.    PLAN SUMMARY: >> (Checking with insurance to see if myeloid gene panel is covered) >> Labs only (CBC/D) in 2 months >> Same-day labs (CBC/D) + OFFICE visit in 4 months     REVIEW OF SYSTEMS:   Review of Systems  Constitutional:  Negative for  appetite change, chills, diaphoresis, fatigue, fever and unexpected weight change.  HENT:   Negative for lump/mass and nosebleeds.   Eyes:  Negative for eye problems.  Respiratory:  Negative for cough, hemoptysis and shortness of breath.   Cardiovascular:   Negative for chest pain, leg swelling and palpitations.  Gastrointestinal:  Negative for abdominal pain, blood in stool, constipation, diarrhea, nausea and vomiting.  Genitourinary:  Negative for hematuria.   Skin: Negative.   Neurological:  Negative for dizziness, headaches and light-headedness.  Hematological:  Does not bruise/bleed easily.     PHYSICAL EXAM:  ECOG PERFORMANCE STATUS: 0 - Asymptomatic  There were no vitals filed for this visit. There were no vitals filed for this visit. Physical Exam Constitutional:      Appearance: Normal appearance.  HENT:     Head: Normocephalic and atraumatic.     Mouth/Throat:     Mouth: Mucous membranes are moist.  Eyes:     Extraocular Movements: Extraocular movements intact.     Pupils: Pupils are equal, round, and reactive to light.  Neck:     Vascular: Carotid bruit (left) present.  Cardiovascular:     Rate and Rhythm: Normal rate and regular rhythm.     Pulses: Normal pulses.     Heart sounds: Murmur (radiation of carotid bruits) heard.  Pulmonary:     Effort: Pulmonary effort is normal.     Breath sounds: Normal breath sounds.  Abdominal:     General: Bowel sounds are normal.     Palpations: Abdomen is soft. There is no splenomegaly.     Tenderness: There is no abdominal tenderness.  Musculoskeletal:        General: No swelling.     Right lower leg: No edema.     Left lower leg: No edema.  Lymphadenopathy:     Cervical: No cervical adenopathy.  Skin:    General: Skin is warm and dry.  Neurological:     General: No focal deficit present.     Mental Status: She is alert and oriented to person, place, and time.  Psychiatric:        Mood and Affect: Mood normal.        Behavior: Behavior normal.     PAST MEDICAL/SURGICAL HISTORY:  Past Medical History:  Diagnosis Date   Dyslipidemia    History of cardiac monitoring    HTN (hypertension)    Hypothyroidism    Rheumatic fever    as a child   Past Surgical History:   Procedure Laterality Date   COLONOSCOPY N/A 02/02/2015   Procedure: COLONOSCOPY;  Surgeon: Rogene Houston, MD;  [facility];  Service: Endoscopy;  Laterality: N/A;  930   COLONOSCOPY N/A 09/20/2021   Procedure: COLONOSCOPY;  Surgeon: Rogene Houston, MD;  [facility];  Service: Endoscopy;  Laterality: N/A;  10:45   ROTATOR CUFF REPAIR  1991   TUBAL LIGATION  1973    SOCIAL HISTORY:  Social History   Socioeconomic History   Marital status: Married    Spouse name: Not on file   Number of children: Not on file   Years of education: Not on file   Highest education level: Not on file  Occupational History   Not on file  Tobacco Use   Smoking status: Never   Smokeless tobacco: Never  Vaping Use   Vaping Use: Never used  Substance and Sexual Activity   Alcohol use: Yes    Comment: 1 glass of  wine once a week   Drug use: No   Sexual activity: Not on file  Other Topics Concern   Not on file  Social History Narrative   Retired Therapist, sports   Social Determinants of Health   Financial Resource Strain: Low Risk  (04/05/2021)   Overall Financial Resource Strain (CARDIA)    Difficulty of Paying Living Expenses: Not hard at all  Food Insecurity: No Food Insecurity (04/05/2021)   Hunger Vital Sign    Worried About Running Out of Food in the Last Year: Never true    [place] in the Last Year: Never true  Transportation Needs: No Transportation Needs (04/05/2021)   PRAPARE - Hydrologist (Medical): No    Lack of Transportation (Non-Medical): No  Physical Activity: Sufficiently Active (04/05/2021)   Exercise Vital Sign    Days of Exercise per Week: 7 days    Minutes of Exercise per Session: 30 min  Stress: No Stress Concern Present (04/05/2021)   [place]    Feeling of Stress : Not at all  Social Connections: [place] (04/05/2021)   Social Connection and  Isolation Panel [NHANES]    Frequency of Communication with Friends and Family: More than three times a week    Frequency of Social Gatherings with Friends and Family: More than three times a week    Attends Religious Services: More than 4 times per year    Active Member of [employer] or Organizations: Yes    Attends Archivist Meetings: 1 to 4 times per year    Marital Status: Married  Human resources officer Violence: Not At Risk (04/05/2021)   Humiliation, Afraid, Rape, and Kick questionnaire    Fear of Current or Ex-Partner: No    Emotionally Abused: No    Physically Abused: No    Sexually Abused: No    FAMILY HISTORY:  Family History  Problem Relation Age of Onset   Coronary artery disease Father 33       cardiac arrest   Diabetes Father    Breast cancer Paternal Grandmother     CURRENT MEDICATIONS:  Outpatient Encounter Medications as of 12/09/2022  Medication Sig   acetaminophen (TYLENOL) 500 MG tablet Take 500 mg by mouth daily as needed (pain).   amLODipine-benazepril (LOTREL) 10-20 MG per capsule Take 1 capsule by mouth daily.   aspirin 81 MG tablet Take 81 mg by mouth daily.   Cholecalciferol (VITAMIN D3) 50 MCG (2000 UT) capsule Take 2,000 Units by mouth daily.   levothyroxine (SYNTHROID, LEVOTHROID) 50 MCG tablet Take 50 mcg by mouth daily.   simvastatin (ZOCOR) 20 MG tablet Take 20 mg by mouth daily.   zinc gluconate 50 MG tablet Take 50 mg by mouth daily.   No facility-administered encounter medications on file as of 12/09/2022.    ALLERGIES:  Allergies  Allergen Reactions   Methylprednisolone Anaphylaxis and Rash    Injection medication-Severe rash and edema-tongue and vocal cords   Morphine Anaphylaxis   Demerol [Meperidine] Swelling    Patient developed tongue swelling. He was treated with Benadryl and prednisone by PCP and is fine   Versed [Midazolam] Nausea And Vomiting    Hard time coming from under   Moviprep [Peg-Kcl-Nacl-Nasulf-Na Asc-C] Nausea And  Vomiting    Severe itching all over, itching in the back of the throat, and lip swelling    LABORATORY DATA:  I have reviewed the labs as listed.  CBC    Component Value Date/Time   WBC 13.1 (H) 12/02/2022 1405   RBC 5.06 12/02/2022 1405   HGB 13.8 12/02/2022 1405   HCT 42.8 12/02/2022 1405   PLT 250 12/02/2022 1405   MCV 84.6 12/02/2022 1405   MCH 27.3 12/02/2022 1405   MCHC 32.2 12/02/2022 1405   RDW 13.8 12/02/2022 1405   LYMPHSABS 3.3 12/02/2022 1405   MONOABS 1.6 (H) 12/02/2022 1405   EOSABS 0.3 12/02/2022 1405   BASOSABS 0.1 12/02/2022 1405      Latest Ref Rng & Units 10/31/2021    8:17 AM 08/21/2021    1:36 PM 02/08/2020    5:35 PM  CMP  Glucose 70 - 99 mg/dL 117  85  135   BUN 8 - 23 mg/dL 17  14  15   $ Creatinine 0.44 - 1.00 mg/dL 0.75  0.75  0.79   Sodium 135 - 145 mmol/L 140  140  137   Potassium 3.5 - 5.1 mmol/L 4.3  4.3  4.0   Chloride 98 - 111 mmol/L 108  106  104   CO2 22 - 32 mmol/L 27  23  23   $ Calcium 8.9 - 10.3 mg/dL 9.1  9.9  9.6   Total Protein 6.5 - 8.1 g/dL 6.5  6.8    Total Bilirubin 0.3 - 1.2 mg/dL 0.9  2.4    Alkaline Phos 38 - 126 U/L 78     AST 15 - 41 U/L 16  15    ALT 0 - 44 U/L 19  14      DIAGNOSTIC IMAGING:  I have independently reviewed the relevant imaging and discussed with the patient.   WRAP UP:  All questions were answered. The patient knows to call the clinic with any problems, questions or concerns.  Medical decision making: Moderate  Time spent on visit: I spent 20 minutes counseling the patient face to face. The total time spent in the appointment was 30 minutes and more than 50% was on counseling.  Harriett Rush, PA-C  12/09/22 11:10 AM

## 2022-12-09 ENCOUNTER — Inpatient Hospital Stay (HOSPITAL_BASED_OUTPATIENT_CLINIC_OR_DEPARTMENT_OTHER): Payer: Medicare HMO | Admitting: Physician Assistant

## 2022-12-09 VITALS — BP 128/75 | HR 72 | Temp 97.5°F | Resp 18 | Ht 65.5 in | Wt 185.0 lb

## 2022-12-09 DIAGNOSIS — D72829 Elevated white blood cell count, unspecified: Secondary | ICD-10-CM | POA: Diagnosis not present

## 2022-12-09 DIAGNOSIS — I10 Essential (primary) hypertension: Secondary | ICD-10-CM | POA: Diagnosis not present

## 2022-12-09 DIAGNOSIS — Z803 Family history of malignant neoplasm of breast: Secondary | ICD-10-CM | POA: Diagnosis not present

## 2022-12-09 DIAGNOSIS — D72821 Monocytosis (symptomatic): Secondary | ICD-10-CM

## 2022-12-09 NOTE — Patient Instructions (Signed)
[facility] [place] **   You were seen today by Tarri Abernethy PA-C for your elevated white blood cells.  Your persistent elevation in neutrophils and monocytes is concerning for a possible type of chronic leukemia known as CMML.   We do not know for sure if you have CMML. We will check to see if your insurance will cover a "myeloid gene panel," which will give [place] further information as to the likelihood of CMML. If you have worsening blood counts or develop any symptoms (such as unexplained fever, chills, night sweats, or weight loss), I would recommend pursuing bone marrow biopsy for definitive diagnosis.  LABS: Return every 2 months for repeat blood check  FOLLOW-UP APPOINTMENT: Office visit in 4 months (or sooner if needed based on symptoms or other lab results)  ** Thank you for trusting me with your healthcare!  I strive to provide all of my patients with quality care at each visit.  If you receive a survey for this visit, I would be so grateful to you for taking the time to provide feedback.  Thank you in advance!  ~ Kjell Brannen                   Dr. Derek Jack   &   Tarri Abernethy, PA-C   - - - - - - - - - - - - - - - - - -    Thank you for choosing [facility] [place] [facility] to provide your oncology and hematology care.  To afford each patient quality time with our provider, please arrive at least 15 minutes before your scheduled appointment time.   If you have a lab appointment with the [facility] please come in thru the Main Entrance and check in at the main information desk.  You need to re-schedule your appointment should you arrive 10 or more minutes late.  We strive to give you quality time with our providers, and arriving late affects you and other patients whose appointments are after yours.  Also, if you no show three or more times for appointments you may be dismissed  from the clinic at the providers discretion.     Again, thank you for choosing [facility].  Our hope is that these requests will decrease the amount of time that you wait before being seen by our physicians.       _____________________________________________________________  Should you have questions after your visit to [facility], please contact our office at [phone] and follow the prompts.  Our office hours are 8:00 a.m. and 4:30 p.m. Monday - Friday.  Please note that voicemails left after 4:00 p.m. may not be returned until the following business day.  We are closed weekends and major holidays.  You do have access to a nurse 24-7, just call the main number to the clinic [phone] and do not press any options, hold on the line and a nurse will answer the phone.    For prescription refill requests, have your pharmacy contact our office and allow 72 hours.

## 2022-12-10 ENCOUNTER — Telehealth: Payer: Self-pay | Admitting: Physician Assistant

## 2022-12-10 ENCOUNTER — Other Ambulatory Visit: Payer: Self-pay | Admitting: *Deleted

## 2022-12-10 ENCOUNTER — Other Ambulatory Visit: Payer: Self-pay

## 2022-12-10 DIAGNOSIS — D72821 Monocytosis (symptomatic): Secondary | ICD-10-CM

## 2022-12-10 DIAGNOSIS — D72829 Elevated white blood cell count, unspecified: Secondary | ICD-10-CM

## 2022-12-10 NOTE — Telephone Encounter (Signed)
PC TO [employer] SPOKE WITH PAUL S. QUESTIONED IF CPT [id number] IS A COVERED EXPENSE  PER PAUL IT IS VAILD AND BILLABLE UNDER THE PLAN.  PLAN PAYS 100% AND THE DED IS WAIVED, NO COPAY. NO PA REQ  CALL REF# [id number]

## 2022-12-11 DIAGNOSIS — N952 Postmenopausal atrophic vaginitis: Secondary | ICD-10-CM | POA: Diagnosis not present

## 2022-12-11 DIAGNOSIS — N6310 Unspecified lump in the right breast, unspecified quadrant: Secondary | ICD-10-CM | POA: Diagnosis not present

## 2022-12-11 DIAGNOSIS — Z6830 Body mass index (BMI) 30.0-30.9, adult: Secondary | ICD-10-CM | POA: Diagnosis not present

## 2022-12-11 DIAGNOSIS — Z01419 Encounter for gynecological examination (general) (routine) without abnormal findings: Secondary | ICD-10-CM | POA: Diagnosis not present

## 2022-12-12 ENCOUNTER — Other Ambulatory Visit: Payer: Self-pay | Admitting: Obstetrics and Gynecology

## 2022-12-12 DIAGNOSIS — N6310 Unspecified lump in the right breast, unspecified quadrant: Secondary | ICD-10-CM

## 2022-12-20 ENCOUNTER — Other Ambulatory Visit: Payer: Self-pay

## 2022-12-23 ENCOUNTER — Other Ambulatory Visit: Payer: Self-pay

## 2022-12-23 ENCOUNTER — Inpatient Hospital Stay: Payer: Medicare HMO | Attending: Physician Assistant

## 2022-12-23 DIAGNOSIS — D72821 Monocytosis (symptomatic): Secondary | ICD-10-CM

## 2022-12-23 DIAGNOSIS — I10 Essential (primary) hypertension: Secondary | ICD-10-CM | POA: Insufficient documentation

## 2022-12-23 DIAGNOSIS — D72829 Elevated white blood cell count, unspecified: Secondary | ICD-10-CM

## 2022-12-23 DIAGNOSIS — Z803 Family history of malignant neoplasm of breast: Secondary | ICD-10-CM | POA: Diagnosis not present

## 2022-12-24 ENCOUNTER — Ambulatory Visit
Admission: RE | Admit: 2022-12-24 | Discharge: 2022-12-24 | Disposition: A | Discharge status: Home / Self Care | Payer: Medicare HMO | Source: Ambulatory Visit | Attending: Obstetrics and Gynecology | Admitting: Obstetrics and Gynecology

## 2022-12-24 ENCOUNTER — Other Ambulatory Visit: Payer: Medicare HMO

## 2022-12-24 DIAGNOSIS — N6310 Unspecified lump in the right breast, unspecified quadrant: Secondary | ICD-10-CM

## 2022-12-24 DIAGNOSIS — N6489 Other specified disorders of breast: Secondary | ICD-10-CM | POA: Diagnosis not present

## 2022-12-31 ENCOUNTER — Ambulatory Visit: Payer: Medicare HMO | Admitting: Physician Assistant

## 2023-01-06 LAB — INTELLIGEN MYELOID

## 2023-01-10 NOTE — Progress Notes (Signed)
[facility] [address], [place] [address]   CLINIC:  Medical Oncology/Hematology  PCP:  Elfredia Nevins, MD [address] [place] [address] [phone]   REASON FOR VISIT:  Follow-up for leukocytosis with monocytosis (suspected CMML)  PRIOR THERAPY: None  CURRENT THERAPY: Surveillance  INTERVAL HISTORY:   Kaitlyn Baker 82 y.o. female returns for routine follow-up of her leukocytosis with monocytosis, which is suspected to be CMML.  She was last seen by Rojelio Brenner PA-C on 12/09/2022.  At today's visit, she reports feeling fairly well.  No recent hospitalizations, surgeries, or changes in baseline health status.  She denies any abnormal fatigue.   Her previous concerns about weight loss have resolved and her weight has been stable. She denies any unexplained fevers, chills, or drenching night sweats.    She denies any frequent infections.    She has not had any systemic steroids since steroid injection to her shoulder on 05/14/2022.   She has not noticed any new lumps or bumps.   She denies early satiety or abdominal fullness.  She has 80% energy and 100% appetite. She endorses that she is maintaining a stable weight.  ASSESSMENT & PLAN:  1.  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  Prior to that her white count was 11.6 with ANC of 7.2 and absolute monocyte count of 1.5. - Work-up unremarkable (04/06/2021):  JAK2, CALR, and MPL were negative; BCR/ABL negative; rheumatoid factor and ANA negative; LDH, CRP and ESR within normal limits - She is a lifelong non-smoker.  No history of splenectomy.  No known autoimmune or inflammatory conditions.   - No splenomegaly or lymphadenopathy appreciated on exam - No chronic systemic steroids.  Received steroid injection  into her shoulder on 05/14/2022  - She denies any B symptoms or recent infections. - Lab trends: (10/31/2021): Normal WBC 9.7, persistently elevated monocytes 1.3 (05/30/2022 - drawn by PCP 2 weeks after steroid injection): WBC 18.1, with ANC 12.1 and monocytes 2.3 (06/13/2022): WBC trending down at 14.9/ANC 10.1/monocytes 2.0.  LDH normal. (07/19/2022): WBC 13.1, ANC 8.8, monocytes 1.8 - CBC today (01/13/2023): WBC 11.4/monocytes 1.7 (15%)/ANC 7.0 - [employer] myeloid panel (12/23/2022): ASXL1 detected: Highly associated with CMML and also frequently seen in AML, MPN, MDS, and age-related clonal hematopoiesis NRAS detected: Contributes to phenotype similar to CMML or AML like disease and MDS. - DIFFERENTIAL DIAGNOSIS: Consider likely Chronic Myelomonocytic Leukemia (CMML) in the setting of persistent monocytic and neutrophilic leukocytosis and ASXL1 and NRAS mutations. dDx also includes age-related clonal hematopoiesis [facility]), which is sometimes considered preleukemic and has been associated with increased risk for blood cancers. dDx also includes other MDS and MPN syndromes - Discussed with patient that since she is currently asymptomatic we can continue active surveillance or obtain additional workup (bone marrow biopsy) if she prefers. If any progressive monocytosis, WBC >15, cytopenias, or constitutional symptoms would absolutely recommend bone marrow biopsy for risk stratification and possible treatment. - PLAN: Discussed with Dr. Ellin Saba, who agrees with the above.  We will continue to monitor closely with CBC/differential and office visit every 3 months. - We will consider bone marrow biopsy if patient prefers or in the presence of severe leukocytosis, cytopenias, or constitutional symptoms.  Patient is agreeable to hold off on bone marrow biopsy for the time being. - Repeat CBC/differential with MD VISIT with DR. KATRAGADDA  in 3 months    2.  Social/family history: - She has Gilbert's  disease -- She is a retired Designer, jewellery.  Never smoker.  No exposure to chemicals. - No family history of leukemia.  Paternal grandmother had breast cancer.    PLAN SUMMARY: >> Labs in 3 months = CBC/differential, LDH >> MD VISIT with DR. KATRAGADDA in 3 months     REVIEW OF SYSTEMS:   Review of Systems  Constitutional:  Negative for appetite change, chills, diaphoresis, fatigue, fever and unexpected weight change.  HENT:   Negative for lump/mass and nosebleeds.   Eyes:  Negative for eye problems.  Respiratory:  Negative for cough, hemoptysis and shortness of breath.   Cardiovascular:  Negative for chest pain, leg swelling and palpitations.  Gastrointestinal:  Negative for abdominal pain, blood in stool, constipation, diarrhea, nausea and vomiting.  Genitourinary:  Negative for hematuria.   Skin: Negative.   Neurological:  Negative for dizziness, headaches and light-headedness.  Hematological:  Does not bruise/bleed easily.     PHYSICAL EXAM:   ECOG PERFORMANCE STATUS: 0 - Asymptomatic There were no vitals filed for this visit. There were no vitals filed for this visit. Physical Exam Constitutional:      Appearance: Normal appearance.  HENT:     Head: Normocephalic and atraumatic.     Mouth/Throat:     Mouth: Mucous membranes are moist.  Eyes:     Extraocular Movements: Extraocular movements intact.     Pupils: Pupils are equal, round, and reactive to light.  Neck:     Vascular: Carotid bruit (left) present.  Cardiovascular:     Rate and Rhythm: Normal rate and regular rhythm.     Pulses: Normal pulses.     Heart sounds: Murmur (radiation of carotid bruits) heard.  Pulmonary:     Effort: Pulmonary effort is normal.     Breath sounds: Normal breath sounds.  Abdominal:     General: Bowel sounds are normal.     Palpations: Abdomen is soft. There is no splenomegaly.     Tenderness: There is no abdominal tenderness.  Musculoskeletal:        General: No swelling.      Right lower leg: No edema.     Left lower leg: No edema.  Lymphadenopathy:     Cervical: No cervical adenopathy.  Skin:    General: Skin is warm and dry.  Neurological:     General: No focal deficit present.     Mental Status: She is alert and oriented to person, place, and time.  Psychiatric:        Mood and Affect: Mood normal.        Behavior: Behavior normal.    PAST MEDICAL/SURGICAL HISTORY:  Past Medical History:  Diagnosis Date   Dyslipidemia    History of cardiac monitoring    HTN (hypertension)    Hypothyroidism    Rheumatic fever    as a child   Past Surgical History:  Procedure Laterality Date   COLONOSCOPY N/A 02/02/2015   Procedure: COLONOSCOPY;  Surgeon: Malissa Hippo, MD;  Location: [facility];  Service: Endoscopy;  Laterality: N/A;  930   COLONOSCOPY N/A 09/20/2021   Procedure: COLONOSCOPY;  Surgeon: Malissa Hippo, MD;  Location: [facility];  Service: Endoscopy;  Laterality: N/A;  10:45   ROTATOR CUFF REPAIR  1991   TUBAL LIGATION  1973    SOCIAL HISTORY:  Social History   Socioeconomic History   Marital status:  Married    Spouse name: Not on file   Number of children: Not on file   Years of education: Not on file   Highest education level: Not on file  Occupational History   Not on file  Tobacco Use   Smoking status: Never   Smokeless tobacco: Never  Vaping Use   Vaping Use: Never used  Substance and Sexual Activity   Alcohol use: Yes    Comment: 1 glass of wine once a week   Drug use: No   Sexual activity: Not on file  Other Topics Concern   Not on file  Social History Narrative   Retired Charity fundraiser   Social Determinants of Health   Financial Resource Strain: Low Risk  (04/05/2021)   Overall Financial Resource Strain (CARDIA)    Difficulty of Paying Living Expenses: Not hard at all  Food Insecurity: No Food Insecurity (04/05/2021)   Hunger Vital Sign    Worried About Running Out of Food in the Last Year: Never true    Ran Out of  Food in the Last Year: Never true  Transportation Needs: No Transportation Needs (04/05/2021)   PRAPARE - Administrator, Civil Service (Medical): No    Lack of Transportation (Non-Medical): No  Physical Activity: Sufficiently Active (04/05/2021)   Exercise Vital Sign    Days of Exercise per Week: 7 days    Minutes of Exercise per Session: 30 min  Stress: No Stress Concern Present (04/05/2021)   [employer] of Occupational Health - Occupational Stress Questionnaire    Feeling of Stress : Not at all  Social Connections: Socially Integrated (04/05/2021)   Social Connection and Isolation Panel [NHANES]    Frequency of Communication with Friends and Family: More than three times a week    Frequency of Social Gatherings with Friends and Family: More than three times a week    Attends Religious Services: More than 4 times per year    Active Member of [employer] or Organizations: Yes    Attends Banker Meetings: 1 to 4 times per year    Marital Status: Married  Catering manager Violence: Not At Risk (04/05/2021)   Humiliation, Afraid, Rape, and Kick questionnaire    Fear of Current or Ex-Partner: No    Emotionally Abused: No    Physically Abused: No    Sexually Abused: No    FAMILY HISTORY:  Family History  Problem Relation Age of Onset   Coronary artery disease Father 57       cardiac arrest   Diabetes Father    Breast cancer Paternal Grandmother     CURRENT MEDICATIONS:  Outpatient Encounter Medications as of 01/13/2023  Medication Sig   acetaminophen (TYLENOL) 500 MG tablet Take 500 mg by mouth daily as needed (pain).   amLODipine-benazepril (LOTREL) 10-20 MG per capsule Take 1 capsule by mouth daily.   aspirin 81 MG tablet Take 81 mg by mouth daily.   Cholecalciferol (VITAMIN D3) 50 MCG (2000 UT) capsule Take 2,000 Units by mouth daily.   levothyroxine (SYNTHROID, LEVOTHROID) 50 MCG tablet Take 50 mcg by mouth daily.   simvastatin (ZOCOR) 20 MG tablet Take  20 mg by mouth daily.   zinc gluconate 50 MG tablet Take 50 mg by mouth daily.   No facility-administered encounter medications on file as of 01/13/2023.    ALLERGIES:  Allergies  Allergen Reactions   Methylprednisolone Anaphylaxis and Rash    Injection medication-Severe rash and edema-tongue and vocal cords  Morphine Anaphylaxis   Demerol [Meperidine] Swelling    Patient developed tongue swelling. He was treated with Benadryl and prednisone by PCP and is fine   Versed [Midazolam] Nausea And Vomiting    Hard time coming from under   Moviprep [Peg-Kcl-Nacl-Nasulf-Na Asc-C] Nausea And Vomiting    Severe itching all over, itching in the back of the throat, and lip swelling    LABORATORY DATA:  I have reviewed the labs as listed.  CBC    Component Value Date/Time   WBC 13.1 (H) 12/02/2022 1405   RBC 5.06 12/02/2022 1405   HGB 13.8 12/02/2022 1405   HCT 42.8 12/02/2022 1405   PLT 250 12/02/2022 1405   MCV 84.6 12/02/2022 1405   MCH 27.3 12/02/2022 1405   MCHC 32.2 12/02/2022 1405   RDW 13.8 12/02/2022 1405   LYMPHSABS 3.3 12/02/2022 1405   MONOABS 1.6 (H) 12/02/2022 1405   EOSABS 0.3 12/02/2022 1405   BASOSABS 0.1 12/02/2022 1405      Latest Ref Rng & Units 10/31/2021    8:17 AM 08/21/2021    1:36 PM 02/08/2020    5:35 PM  CMP  Glucose 70 - 99 mg/dL 884  85  166   BUN 8 - 23 mg/dL 17  14  15    Creatinine 0.44 - 1.00 mg/dL 0.63  0.16  0.10   Sodium 135 - 145 mmol/L 140  140  137   Potassium 3.5 - 5.1 mmol/L 4.3  4.3  4.0   Chloride 98 - 111 mmol/L 108  106  104   CO2 22 - 32 mmol/L 27  23  23    Calcium 8.9 - 10.3 mg/dL 9.1  9.9  9.6   Total Protein 6.5 - 8.1 g/dL 6.5  6.8    Total Bilirubin 0.3 - 1.2 mg/dL 0.9  2.4    Alkaline Phos 38 - 126 U/L 78     AST 15 - 41 U/L 16  15    ALT 0 - 44 U/L 19  14      DIAGNOSTIC IMAGING:  I have independently reviewed the relevant imaging and discussed with the patient.   WRAP UP:  All questions were answered. The patient  knows to call the clinic with any problems, questions or concerns.  Medical decision making: Moderate  Time spent on visit: I spent 20 minutes counseling the patient face to face. The total time spent in the appointment was 30 minutes and more than 50% was on counseling.  Carnella Guadalajara, PA-C  01/13/23 5:00 PM

## 2023-01-13 ENCOUNTER — Inpatient Hospital Stay (HOSPITAL_BASED_OUTPATIENT_CLINIC_OR_DEPARTMENT_OTHER): Payer: Medicare HMO | Admitting: Physician Assistant

## 2023-01-13 ENCOUNTER — Inpatient Hospital Stay: Payer: Medicare HMO

## 2023-01-13 VITALS — BP 117/78 | HR 77 | Temp 97.4°F | Resp 18 | Ht 65.5 in | Wt 183.0 lb

## 2023-01-13 DIAGNOSIS — D72829 Elevated white blood cell count, unspecified: Secondary | ICD-10-CM

## 2023-01-13 DIAGNOSIS — C9310 Chronic myelomonocytic leukemia not having achieved remission: Secondary | ICD-10-CM | POA: Diagnosis not present

## 2023-01-13 DIAGNOSIS — D72821 Monocytosis (symptomatic): Secondary | ICD-10-CM

## 2023-01-13 DIAGNOSIS — I10 Essential (primary) hypertension: Secondary | ICD-10-CM | POA: Diagnosis not present

## 2023-01-13 DIAGNOSIS — Z803 Family history of malignant neoplasm of breast: Secondary | ICD-10-CM | POA: Diagnosis not present

## 2023-01-13 LAB — CBC WITH DIFFERENTIAL/PLATELET
Abs Immature Granulocytes: 0.03 10*3/uL (ref 0.00–0.07)
Basophils Absolute: 0.1 10*3/uL (ref 0.0–0.1)
Basophils Relative: 1 %
Eosinophils Absolute: 0.2 10*3/uL (ref 0.0–0.5)
Eosinophils Relative: 2 %
HCT: 42.8 % (ref 36.0–46.0)
Hemoglobin: 14 g/dL (ref 12.0–15.0)
Immature Granulocytes: 0 %
Lymphocytes Relative: 21 %
Lymphs Abs: 2.4 10*3/uL (ref 0.7–4.0)
MCH: 27.9 pg (ref 26.0–34.0)
MCHC: 32.7 g/dL (ref 30.0–36.0)
MCV: 85.3 fL (ref 80.0–100.0)
Monocytes Absolute: 1.7 10*3/uL — ABNORMAL HIGH (ref 0.1–1.0)
Monocytes Relative: 15 %
Neutro Abs: 7 10*3/uL (ref 1.7–7.7)
Neutrophils Relative %: 61 %
Platelets: 260 10*3/uL (ref 150–400)
RBC: 5.02 MIL/uL (ref 3.87–5.11)
RDW: 14 % (ref 11.5–15.5)
WBC: 11.4 10*3/uL — ABNORMAL HIGH (ref 4.0–10.5)
nRBC: 0 % (ref 0.0–0.2)

## 2023-01-13 NOTE — Patient Instructions (Signed)
[place] [facility] [place] **   You were seen today by Tarri Abernethy PA-C for your follow-up visit.    HIGH WHITE BLOOD CELLS Your blood work shows that you may have diagnosis of CMML (chronic myelomonocytic leukemia).  Although we cannot definitively diagnose this without bone marrow biopsy, your persistent elevations in monocytes and neutrophils, as well as your mutations in NRAS and ASXL1 correlate with possible CMML. Since you are currently asymptomatic with only mild abnormalities in your blood counts, you do not need bone marrow biopsy or chemotherapy treatment. However, we will continue active surveillance with repeat blood counts and office visit in 3 months.  FOLLOW-UP APPOINTMENT: Office visit with Dr. Delton Coombes in 3 months  ** Thank you for trusting me with your healthcare!  I strive to provide all of my patients with quality care at each visit.  If you receive a survey for this visit, I would be so grateful to you for taking the time to provide feedback.  Thank you in advance!  ~ Roselind Klus                   Dr. Derek Jack   &   Tarri Abernethy, PA-C   - - - - - - - - - - - - - - - - - -    Thank you for choosing [facility] to provide your oncology and hematology care.  To afford each patient quality time with our provider, please arrive at least 15 minutes before your scheduled appointment time.   If you have a lab appointment with the [facility] please come in thru the Main Entrance and check in at the main information desk.  You need to re-schedule your appointment should you arrive 10 or more minutes late.  We strive to give you quality time with our providers, and arriving late affects you and other patients whose appointments are after yours.  Also, if you no show three or more times for appointments you may be dismissed from the clinic at the providers  discretion.     Again, thank you for choosing [facility].  Our hope is that these requests will decrease the amount of time that you wait before being seen by our physicians.       _____________________________________________________________  Should you have questions after your visit to [facility], please contact our office at [phone] and follow the prompts.  Our office hours are 8:00 a.m. and 4:30 p.m. Monday - Friday.  Please note that voicemails left after 4:00 p.m. may not be returned until the following business day.  We are closed weekends and major holidays.  You do have access to a nurse 24-7, just call the main number to the clinic [phone] and do not press any options, hold on the line and a nurse will answer the phone.    For prescription refill requests, have your pharmacy contact our office and allow 72 hours.

## 2023-01-14 ENCOUNTER — Other Ambulatory Visit: Payer: Self-pay

## 2023-01-14 DIAGNOSIS — D72829 Elevated white blood cell count, unspecified: Secondary | ICD-10-CM

## 2023-01-14 DIAGNOSIS — C9310 Chronic myelomonocytic leukemia not having achieved remission: Secondary | ICD-10-CM

## 2023-01-14 DIAGNOSIS — D72821 Monocytosis (symptomatic): Secondary | ICD-10-CM

## 2023-02-07 ENCOUNTER — Other Ambulatory Visit: Payer: Medicare HMO

## 2023-03-27 ENCOUNTER — Ambulatory Visit (HOSPITAL_COMMUNITY)
Admission: RE | Admit: 2023-03-27 | Discharge: 2023-03-27 | Disposition: A | Discharge status: Home / Self Care | Payer: Medicare HMO | Source: Ambulatory Visit | Attending: Cardiology | Admitting: Cardiology

## 2023-03-27 ENCOUNTER — Encounter: Payer: Self-pay | Admitting: Cardiovascular Disease

## 2023-03-27 ENCOUNTER — Ambulatory Visit: Payer: Medicare HMO | Admitting: Cardiovascular Disease

## 2023-03-27 VITALS — BP 122/72 | HR 77 | Ht 65.5 in | Wt 183.2 lb

## 2023-03-27 DIAGNOSIS — R0989 Other specified symptoms and signs involving the circulatory and respiratory systems: Secondary | ICD-10-CM | POA: Insufficient documentation

## 2023-03-27 DIAGNOSIS — I771 Stricture of artery: Secondary | ICD-10-CM | POA: Insufficient documentation

## 2023-03-27 DIAGNOSIS — I10 Essential (primary) hypertension: Secondary | ICD-10-CM | POA: Insufficient documentation

## 2023-03-27 NOTE — Patient Instructions (Signed)
Medication Instructions:  The current medical regimen is effective;  continue present plan and medications.  *If you need a refill on your cardiac medications before your next appointment, please call your pharmacy*   Testing/Procedures: Your physician has requested that you have a carotid duplex(12 month). This test is an ultrasound of the carotid arteries in your neck. It looks at blood flow through these arteries that supply the brain with blood. Allow one hour for this exam. There are no restrictions or special instructions.    Follow-Up: At [facility], you and your health needs are our priority.  As part of our continuing mission to provide you with exceptional heart care, we have created designated Provider Care Teams.  These Care Teams include your primary Cardiologist (physician) and Advanced Practice Providers (APPs -  Physician Assistants and Nurse Practitioners) who all work together to provide you with the care you need, when you need it.  We recommend signing up for the patient portal called "MyChart".  Sign up information is provided on this After Visit Summary.  MyChart is used to connect with patients for Virtual Visits (Telemedicine).  Patients are able to view lab/test results, encounter notes, upcoming appointments, etc.  Non-urgent messages can be sent to your provider as well.   To learn more about what you can do with MyChart, go to [email or web address].    Your next appointment:   12 month(s)  Provider:   Nanetta Batty, MD

## 2023-03-27 NOTE — Assessment & Plan Note (Signed)
History of dyslipidemia on simvastatin with lipid profile performed a year ago revealing total cholesterol 156, LDL 83 and HDL of 56, acceptable for primary prevention.

## 2023-03-27 NOTE — Assessment & Plan Note (Signed)
History of mild left subclavian artery stenosis which has been stable by duplex ultrasound most recently performed today 03/27/2023 revealing a 15 mm upper extremity blood pressure differential, normal internal carotid arteries and antegrade blood flow in the left subclavian artery.  She is totally asymptomatic from this.  Will continue to follow this noninvasively on annual basis.

## 2023-03-27 NOTE — Assessment & Plan Note (Signed)
History of essential hypertension blood pressure measured today at 122/72.  She is on Lotrel.

## 2023-03-27 NOTE — Progress Notes (Signed)
03/27/2023 RICKEL CETINA   05/10/1941  [id number]  Primary Physician Kaitlyn Nevins, MD Primary Cardiologist: Kaitlyn Gess MD Kaitlyn Baker, [place]  HPI:  Kaitlyn Baker is a 82 y.o.  mildly overweight married Caucasian female, mother of 3, whose husband Kaitlyn Baker  is also a patient of mine. I last saw her 06/19/2022. She has a history of hyperlipidemia, treated hypertension, and family history of heart disease. She was complaining of some atypical chest pain radiating to her back, which is a new finding for her 2 years ago. She had a Myoview stress test performed on May 28, 2012, which was entirely normal, and subsequent to that her symptoms completely resolved.   She had  an episode of shortness of breath last summer which ultimately resolved spontaneously and turned out not to be anything serious.  She did spend time in [place] with her granddaughter several years ago touring [place], [place], [place] and [place].  Her granddaughter graduated [employer] with honors and now is considering going to medical school.      She did have carotid Dopplers performed 03/26/2021 because of an auscultated bruit that showed patent internal carotid arteries with a left subclavian artery stenosis and a 12 mm upper extremity blood pressure gradient although she had antegrade vertebral filling but no symptoms of subclavian steal.   Since I saw her a year ago she is remained stable.  She does have a new diagnosis of CMML although this has been relatively stable.  She denies chest pain or shortness of breath.  Her carotid Dopplers performed today showed no changes in her left subclavian artery stenosis.  Her palpitations have not changed in frequency or severity.  She denies chest pain or shortness of breath.   Current Meds  Medication Sig   acetaminophen (TYLENOL) 500 MG tablet Take 500 mg by mouth daily as needed (pain).   amLODipine-benazepril (LOTREL) 10-20 MG per capsule Take 1 capsule  by mouth daily.   aspirin 81 MG tablet Take 81 mg by mouth daily.   Cholecalciferol (VITAMIN D3) 50 MCG (2000 UT) capsule Take 2,000 Units by mouth daily.   levothyroxine (SYNTHROID, LEVOTHROID) 50 MCG tablet Take 50 mcg by mouth daily.   simvastatin (ZOCOR) 20 MG tablet Take 20 mg by mouth daily.   zinc gluconate 50 MG tablet Take 50 mg by mouth daily.     Allergies  Allergen Reactions   Methylprednisolone Anaphylaxis and Rash    Injection medication-Severe rash and edema-tongue and vocal cords   Morphine Anaphylaxis   Demerol [Meperidine] Swelling    Patient developed tongue swelling. He was treated with Benadryl and prednisone by PCP and is fine   Versed [Midazolam] Nausea And Vomiting    Hard time coming from under   Moviprep [Peg-Kcl-Nacl-Nasulf-Na Asc-C] Nausea And Vomiting    Severe itching all over, itching in the back of the throat, and lip swelling    Social History   Socioeconomic History   Marital status: Married    Spouse name: Not on file   Number of children: Not on file   Years of education: Not on file   Highest education level: Not on file  Occupational History   Not on file  Tobacco Use   Smoking status: Never   Smokeless tobacco: Never  Vaping Use   Vaping Use: Never used  Substance and Sexual Activity   Alcohol use: Yes    Comment: 1 glass of wine once a week  Drug use: No   Sexual activity: Not on file  Other Topics Concern   Not on file  Social History Narrative   Retired Charity fundraiser   Social Determinants of Health   Financial Resource Strain: Low Risk  (04/05/2021)   Overall Financial Resource Strain (CARDIA)    Difficulty of Paying Living Expenses: Not hard at all  Food Insecurity: No Food Insecurity (04/05/2021)   Hunger Vital Sign    Worried About Running Out of Food in the Last Year: Never true    Ran Out of Food in the Last Year: Never true  Transportation Needs: No Transportation Needs (04/05/2021)   PRAPARE - Scientist, research (physical sciences) (Medical): No    Lack of Transportation (Non-Medical): No  Physical Activity: Sufficiently Active (04/05/2021)   Exercise Vital Sign    Days of Exercise per Week: 7 days    Minutes of Exercise per Session: 30 min  Stress: No Stress Concern Present (04/05/2021)   [employer] of Occupational Health - Occupational Stress Questionnaire    Feeling of Stress : Not at all  Social Connections: Socially Integrated (04/05/2021)   Social Connection and Isolation Panel [NHANES]    Frequency of Communication with Friends and Family: More than three times a week    Frequency of Social Gatherings with Friends and Family: More than three times a week    Attends Religious Services: More than 4 times per year    Active Member of [employer] or Organizations: Yes    Attends Banker Meetings: 1 to 4 times per year    Marital Status: Married  Catering manager Violence: Not At Risk (04/05/2021)   Humiliation, Afraid, Rape, and Kick questionnaire    Fear of Current or Ex-Partner: No    Emotionally Abused: No    Physically Abused: No    Sexually Abused: No     Review of Systems: General: negative for chills, fever, night sweats or weight changes.  Cardiovascular: negative for chest pain, dyspnea on exertion, edema, orthopnea, palpitations, paroxysmal nocturnal dyspnea or shortness of breath Dermatological: negative for rash Respiratory: negative for cough or wheezing Urologic: negative for hematuria Abdominal: negative for nausea, vomiting, diarrhea, bright red blood per rectum, melena, or hematemesis Neurologic: negative for visual changes, syncope, or dizziness All other systems reviewed and are otherwise negative except as noted above.    Blood pressure 122/72, pulse 77, height 5' 5.5" (1.664 m), weight 183 lb 3.2 oz (83.1 kg), SpO2 99 %.  General appearance: alert and no distress Neck: no adenopathy, no JVD, supple, symmetrical, trachea midline, thyroid not enlarged,  symmetric, no tenderness/mass/nodules, and left carotid/subclavian bruit Lungs: clear to auscultation bilaterally Heart: regular rate and rhythm, S1, S2 normal, no murmur, click, rub or gallop Extremities: extremities normal, atraumatic, no cyanosis or edema Pulses: 2+ and symmetric Skin: Skin color, texture, turgor normal. No rashes or lesions Neurologic: Grossly normal  EKG sinus rhythm at 77 without ST or T wave changes.  I personally reviewed this EKG.  ASSESSMENT AND PLAN:   HTN (hypertension) History of essential hypertension blood pressure measured today at 122/72.  She is on Lotrel.  Dyslipidemia History of dyslipidemia on simvastatin with lipid profile performed a year ago revealing total cholesterol 156, LDL 83 and HDL of 56, acceptable for primary prevention.  Stenosis of left subclavian artery (HCC) History of mild left subclavian artery stenosis which has been stable by duplex ultrasound most recently performed today 03/27/2023 revealing a 15 mm upper extremity  blood pressure differential, normal internal carotid arteries and antegrade blood flow in the left subclavian artery.  She is totally asymptomatic from this.  Will continue to follow this noninvasively on annual basis.     Kaitlyn Gess MD FACP,FACC,FAHA, [facility] 03/27/2023 2:05 PM

## 2023-04-07 DIAGNOSIS — D225 Melanocytic nevi of trunk: Secondary | ICD-10-CM | POA: Diagnosis not present

## 2023-04-07 DIAGNOSIS — X32XXXD Exposure to sunlight, subsequent encounter: Secondary | ICD-10-CM | POA: Diagnosis not present

## 2023-04-07 DIAGNOSIS — Z1283 Encounter for screening for malignant neoplasm of skin: Secondary | ICD-10-CM | POA: Diagnosis not present

## 2023-04-07 DIAGNOSIS — L570 Actinic keratosis: Secondary | ICD-10-CM | POA: Diagnosis not present

## 2023-04-11 ENCOUNTER — Inpatient Hospital Stay: Payer: Medicare HMO | Attending: Physician Assistant

## 2023-04-11 DIAGNOSIS — E039 Hypothyroidism, unspecified: Secondary | ICD-10-CM | POA: Diagnosis not present

## 2023-04-11 DIAGNOSIS — Z79890 Hormone replacement therapy (postmenopausal): Secondary | ICD-10-CM | POA: Insufficient documentation

## 2023-04-11 DIAGNOSIS — E785 Hyperlipidemia, unspecified: Secondary | ICD-10-CM | POA: Diagnosis not present

## 2023-04-11 DIAGNOSIS — D72829 Elevated white blood cell count, unspecified: Secondary | ICD-10-CM

## 2023-04-11 DIAGNOSIS — Z7982 Long term (current) use of aspirin: Secondary | ICD-10-CM | POA: Diagnosis not present

## 2023-04-11 DIAGNOSIS — C9310 Chronic myelomonocytic leukemia not having achieved remission: Secondary | ICD-10-CM

## 2023-04-11 DIAGNOSIS — D72821 Monocytosis (symptomatic): Secondary | ICD-10-CM | POA: Diagnosis not present

## 2023-04-11 DIAGNOSIS — I10 Essential (primary) hypertension: Secondary | ICD-10-CM | POA: Diagnosis not present

## 2023-04-11 DIAGNOSIS — Z803 Family history of malignant neoplasm of breast: Secondary | ICD-10-CM | POA: Diagnosis not present

## 2023-04-11 DIAGNOSIS — Z79899 Other long term (current) drug therapy: Secondary | ICD-10-CM | POA: Insufficient documentation

## 2023-04-11 LAB — CBC WITH DIFFERENTIAL/PLATELET
Abs Immature Granulocytes: 0.05 10*3/uL (ref 0.00–0.07)
Basophils Absolute: 0.1 10*3/uL (ref 0.0–0.1)
Basophils Relative: 1 %
Eosinophils Absolute: 0.3 10*3/uL (ref 0.0–0.5)
Eosinophils Relative: 2 %
HCT: 41.2 % (ref 36.0–46.0)
Hemoglobin: 13.4 g/dL (ref 12.0–15.0)
Immature Granulocytes: 0 %
Lymphocytes Relative: 20 %
Lymphs Abs: 2.6 10*3/uL (ref 0.7–4.0)
MCH: 27.5 pg (ref 26.0–34.0)
MCHC: 32.5 g/dL (ref 30.0–36.0)
MCV: 84.4 fL (ref 80.0–100.0)
Monocytes Absolute: 2 10*3/uL — ABNORMAL HIGH (ref 0.1–1.0)
Monocytes Relative: 15 %
Neutro Abs: 8.2 10*3/uL — ABNORMAL HIGH (ref 1.7–7.7)
Neutrophils Relative %: 62 %
Platelets: 259 10*3/uL (ref 150–400)
RBC: 4.88 MIL/uL (ref 3.87–5.11)
RDW: 13.9 % (ref 11.5–15.5)
WBC: 13.1 10*3/uL — ABNORMAL HIGH (ref 4.0–10.5)
nRBC: 0 % (ref 0.0–0.2)

## 2023-04-11 LAB — LACTATE DEHYDROGENASE: LDH: 117 U/L (ref 98–192)

## 2023-04-16 NOTE — Progress Notes (Signed)
[facility] [address] [place] [address]    Clinic Day:  04/17/2023  Referring physician: Elfredia Nevins, MD  Patient Care Team: Elfredia Nevins, MD as PCP - General (Internal Medicine) Runell Gess, MD as PCP - Cardiology (Cardiology) Malissa Hippo, MD (Inactive) as Consulting Physician (Gastroenterology)   ASSESSMENT & PLAN:   Assessment: 1.  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  Prior to that her white count was 11.6 with ANC of 7.2 and absolute monocyte count of 1.5. - Work-up unremarkable (04/06/2021):  JAK2, CALR, and MPL were negative; BCR/ABL negative; rheumatoid factor and ANA negative; LDH, CRP and ESR within normal limits - She is a lifelong non-smoker.  No history of splenectomy.  No known autoimmune or inflammatory conditions.   - No splenomegaly or lymphadenopathy appreciated on exam - No chronic systemic steroids.  Received steroid injection into her shoulder on 05/14/2022  - She denies any B symptoms or recent infections. - Lab trends:       -- (10/31/2021): Normal WBC 9.7, persistently elevated monocytes 1.3       -- (05/30/2022 - drawn by PCP 2 weeks after steroid injection): WBC 18.1, with ANC 12.1 and monocytes 2.3       -- (06/13/2022): WBC trending down at 14.9/ANC 10.1/monocytes 2.0.  LDH normal.       -- (07/19/2022): WBC 13.1, ANC 8.8, monocytes 1.8 - CBC today (01/13/2023): WBC 11.4/monocytes 1.7 (15%)/ANC 7.0 - [employer] myeloid panel (12/23/2022):       -- ASXL1 detected: Highly associated with CMML and also frequently seen in AML, MPN, MDS, and age-related clonal hematopoiesis       -- NRAS detected: Contributes to phenotype similar to CMML or AML like disease and MDS. - DIFFERENTIAL DIAGNOSIS: Consider likely Chronic Myelomonocytic Leukemia  (CMML) in the setting of persistent monocytic and neutrophilic leukocytosis and ASXL1 and NRAS mutations.       -- dDx also includes age-related clonal hematopoiesis [facility]), which is sometimes considered preleukemic and has been associated with increased risk for blood cancers.       -- dDx also includes other MDS and MPN syndromes  2.  Social/family history: - She has Gilbert's disease -- She is a retired Designer, jewellery.  Never smoker.  No exposure to chemicals. - No family history of leukemia.  Paternal grandmother had breast cancer.   Plan: 1.  Neutrophilic and monocytic leukocytosis, suspected Chronic Myelomonocytic Leukemia (CMML):  - We have reviewed her labs from 04/11/2023.  White count is elevated and stable with neutrophilic leukocytosis and monocytosis.  No other cytopenias.  LDH normal. - We discussed the differential diagnosis of CMML. - She does not have any B symptoms/recurrent infections. - Physical exam: No palpable spleen. - We will proceed with bone marrow biopsy with cytogenetics and MDS FISH panel. - RTC 3 weeks after bone marrow biopsy to discuss results and further plan.    Orders Placed This Encounter  Procedures   CT BONE MARROW BIOPSY & ASPIRATION    Cytogenetics; MDS FISH panel    Standing Status:   Future    Standing Expiration Date:   04/16/2024    Order Specific Question:   Reason for Exam (SYMPTOM  OR DIAGNOSIS REQUIRED)    Answer:   confirm dx CMML    Order Specific Question:   Preferred location?  Answer:   [facility]      I,Katie Daubenspeck,acting as a scribe for Doreatha Massed, MD.,have documented all relevant documentation on the behalf of Doreatha Massed, MD,as directed by  Doreatha Massed, MD while in the presence of Doreatha Massed, MD.   I, Doreatha Massed MD, have reviewed the above documentation for accuracy and completeness, and I agree with the above.   Doreatha Massed, MD   6/27/20245:15  PM  CHIEF COMPLAINT:   Diagnosis:  leukocytosis with monocytosis (suspected CMML)    Cancer Staging  No matching staging information was found for the patient.   Prior Therapy: none  Current Therapy:  surveillance   HISTORY OF PRESENT ILLNESS:   Oncology History   No history exists.     INTERVAL HISTORY:   Kaitlyn Baker is a 82 y.o. female presenting to clinic today for follow up of  leukocytosis with monocytosis (suspected CMML). She was last seen by PA Rebekah on 01/13/23.  Today, she states that she is doing well overall. Her appetite level is at 100%. Her energy level is at 75 5%.  PAST MEDICAL HISTORY:   Past Medical History: Past Medical History:  Diagnosis Date   Dyslipidemia    History of cardiac monitoring    HTN (hypertension)    Hypothyroidism    Rheumatic fever    as a child    Surgical History: Past Surgical History:  Procedure Laterality Date   COLONOSCOPY N/A 02/02/2015   Procedure: COLONOSCOPY;  Surgeon: Malissa Hippo, MD;  [facility];  Service: Endoscopy;  Laterality: N/A;  930   COLONOSCOPY N/A 09/20/2021   Procedure: COLONOSCOPY;  Surgeon: Malissa Hippo, MD;  Location: [facility];  Service: Endoscopy;  Laterality: N/A;  10:45   ROTATOR CUFF REPAIR  1991   TUBAL LIGATION  1973    Social History: Social History   Socioeconomic History   Marital status: Married    Spouse name: Not on file   Number of children: Not on file   Years of education: Not on file   Highest education level: Not on file  Occupational History   Not on file  Tobacco Use   Smoking status: Never   Smokeless tobacco: Never  Vaping Use   Vaping Use: Never used  Substance and Sexual Activity   Alcohol use: Yes    Comment: 1 glass of wine once a week   Drug use: No   Sexual activity: Not on file  Other Topics Concern   Not on file  Social History Narrative   Retired Charity fundraiser   Social Determinants of Health   Financial Resource Strain: Low Risk   (04/05/2021)   Overall Financial Resource Strain (CARDIA)    Difficulty of Paying Living Expenses: Not hard at all  Food Insecurity: No Food Insecurity (04/05/2021)   Hunger Vital Sign    Worried About Running Out of Food in the Last Year: Never true    Ran Out of Food in the Last Year: Never true  Transportation Needs: No Transportation Needs (04/05/2021)   PRAPARE - Administrator, Civil Service (Medical): No    Lack of Transportation (Non-Medical): No  Physical Activity: Sufficiently Active (04/05/2021)   Exercise Vital Sign    Days of Exercise per Week: 7 days    Minutes of Exercise per Session: 30 min  Stress: No Stress Concern Present (04/05/2021)   [employer] of Occupational Health - Occupational Stress Questionnaire    Feeling of Stress :  Not at all  Social Connections: Socially Integrated (04/05/2021)   Social Connection and Isolation Panel [NHANES]    Frequency of Communication with Friends and Family: More than three times a week    Frequency of Social Gatherings with Friends and Family: More than three times a week    Attends Religious Services: More than 4 times per year    Active Member of [employer] or Organizations: Yes    Attends Banker Meetings: 1 to 4 times per year    Marital Status: Married  Catering manager Violence: Not At Risk (04/05/2021)   Humiliation, Afraid, Rape, and Kick questionnaire    Fear of Current or Ex-Partner: No    Emotionally Abused: No    Physically Abused: No    Sexually Abused: No    Family History: Family History  Problem Relation Age of Onset   Coronary artery disease Father 33       cardiac arrest   Diabetes Father    Breast cancer Paternal Grandmother     Current Medications:  Current Outpatient Medications:    acetaminophen (TYLENOL) 500 MG tablet, Take 500 mg by mouth daily as needed (pain)., Disp: , Rfl:    amLODipine-benazepril (LOTREL) 10-20 MG per capsule, Take 1 capsule by mouth daily., Disp: ,  Rfl:    aspirin 81 MG tablet, Take 81 mg by mouth daily., Disp: , Rfl:    Cholecalciferol (VITAMIN D3) 50 MCG (2000 UT) capsule, Take 2,000 Units by mouth daily., Disp: , Rfl:    levothyroxine (SYNTHROID, LEVOTHROID) 50 MCG tablet, Take 50 mcg by mouth daily., Disp: , Rfl:    simvastatin (ZOCOR) 20 MG tablet, Take 20 mg by mouth daily., Disp: , Rfl:    Allergies: Allergies  Allergen Reactions   Methylprednisolone Anaphylaxis and Rash    Injection medication-Severe rash and edema-tongue and vocal cords   Morphine Anaphylaxis   Demerol [Meperidine] Swelling    Patient developed tongue swelling. He was treated with Benadryl and prednisone by PCP and is fine   Versed [Midazolam] Nausea And Vomiting    Hard time coming from under   Moviprep [Peg-Kcl-Nacl-Nasulf-Na Asc-C] Nausea And Vomiting    Severe itching all over, itching in the back of the throat, and lip swelling    REVIEW OF SYSTEMS:   Review of Systems  Constitutional:  Negative for chills, fatigue and fever.  HENT:   Negative for lump/mass, mouth sores, nosebleeds, sore throat and trouble swallowing.   Eyes:  Negative for eye problems.  Respiratory:  Negative for cough and shortness of breath.   Cardiovascular:  Negative for chest pain, leg swelling and palpitations.  Gastrointestinal:  Negative for abdominal pain, constipation, diarrhea, nausea and vomiting.  Genitourinary:  Negative for bladder incontinence, difficulty urinating, dysuria, frequency, hematuria and nocturia.   Musculoskeletal:  Negative for arthralgias, back pain, flank pain, myalgias and neck pain.  Skin:  Negative for itching and rash.  Neurological:  Negative for dizziness, headaches and numbness.  Hematological:  Does not bruise/bleed easily.  Psychiatric/Behavioral:  Negative for depression, sleep disturbance and suicidal ideas. The patient is not nervous/anxious.   All other systems reviewed and are negative.    VITALS:   Blood pressure 110/67,  pulse 84, temperature 98.3 F (36.8 C), temperature source Oral, resp. rate 16, weight 183 lb 9.6 oz (83.3 kg), SpO2 99 %.  Wt Readings from Last 3 Encounters:  04/17/23 183 lb 9.6 oz (83.3 kg)  03/27/23 183 lb 3.2 oz (83.1 kg)  01/13/23  183 lb (83 kg)    Body mass index is 30.09 kg/m.  Performance status (ECOG): 1 - Symptomatic but completely ambulatory  PHYSICAL EXAM:   Physical Exam Vitals and nursing note reviewed. Exam conducted with a chaperone present.  Constitutional:      Appearance: Normal appearance.  Cardiovascular:     Rate and Rhythm: Normal rate and regular rhythm.     Pulses: Normal pulses.     Heart sounds: Normal heart sounds.  Pulmonary:     Effort: Pulmonary effort is normal.     Breath sounds: Normal breath sounds.  Abdominal:     Palpations: Abdomen is soft. There is no hepatomegaly, splenomegaly or mass.     Tenderness: There is no abdominal tenderness.  Musculoskeletal:     Right lower leg: No edema.     Left lower leg: No edema.  Lymphadenopathy:     Cervical: No cervical adenopathy.     Right cervical: No superficial, deep or posterior cervical adenopathy.    Left cervical: No superficial, deep or posterior cervical adenopathy.     Upper Body:     Right upper body: No supraclavicular or axillary adenopathy.     Left upper body: No supraclavicular or axillary adenopathy.  Neurological:     General: No focal deficit present.     Mental Status: She is alert and oriented to person, place, and time.  Psychiatric:        Mood and Affect: Mood normal.        Behavior: Behavior normal.     LABS:      Latest Ref Rng & Units 04/11/2023   12:45 PM 01/13/2023   11:14 AM 12/02/2022    2:05 PM  CBC  WBC 4.0 - 10.5 K/uL 13.1  11.4  13.1   Hemoglobin 12.0 - 15.0 g/dL 69.6  29.5  28.4   Hematocrit 36.0 - 46.0 % 41.2  42.8  42.8   Platelets 150 - 400 K/uL 259  260  250       Latest Ref Rng & Units 10/31/2021    8:17 AM 08/21/2021    1:36 PM 02/08/2020     5:35 PM  CMP  Glucose 70 - 99 mg/dL 132  85  440   BUN 8 - 23 mg/dL 17  14  15    Creatinine 0.44 - 1.00 mg/dL 1.02  7.25  3.66   Sodium 135 - 145 mmol/L 140  140  137   Potassium 3.5 - 5.1 mmol/L 4.3  4.3  4.0   Chloride 98 - 111 mmol/L 108  106  104   CO2 22 - 32 mmol/L 27  23  23    Calcium 8.9 - 10.3 mg/dL 9.1  9.9  9.6   Total Protein 6.5 - 8.1 g/dL 6.5  6.8    Total Bilirubin 0.3 - 1.2 mg/dL 0.9  2.4    Alkaline Phos 38 - 126 U/L 78     AST 15 - 41 U/L 16  15    ALT 0 - 44 U/L 19  14       No results found for: "CEA1", "CEA" / No results found for: "CEA1", "CEA" No results found for: "PSA1" No results found for: "YQI347" No results found for: "CAN125"  No results found for: "TOTALPROTELP", "ALBUMINELP", "A1GS", "A2GS", "BETS", "BETA2SER", "GAMS", "MSPIKE", "SPEI" No results found for: "TIBC", "FERRITIN", "IRONPCTSAT" Lab Results  Component Value Date   LDH 117 04/11/2023   LDH 141 12/02/2022  LDH 141 09/30/2022     STUDIES:   VAS US CAROTID  Result Date: 03/27/2023 Carotid Arterial Duplex Study Patient Name:  FAUNA DEISHER  Date of Exam:   03/27/2023 Medical Rec #: [id number]         Accession #:    [id number] Date of Birth: Feb 28, 1941         Patient Gender: F Patient Age:   32 years Exam Location:  [facility] Procedure:      VAS US CAROTID Referring Phys: Christiane Ha BERRY --------------------------------------------------------------------------------  Indications:       Left bruit. Risk Factors:      Hypertension, hyperlipidemia. Comparison Study:  04/01/22 performed showed left subclavian artery stenosis                    although she only had a 6 mm upper extremity blood pressure                    differential with antegrade vertebral flow bilaterally. She                    is completely asymptomatic from this. ICA flow was normal. Performing Technologist: Melissa Church BS, RVT, RDCS  Examination Guidelines: A complete evaluation includes B-mode imaging, spectral  Doppler, color Doppler, and power Doppler as needed of all accessible portions of each vessel. Bilateral testing is considered an integral part of a complete examination. Limited examinations for reoccurring indications may be performed as noted.  Right Carotid Findings: +----------+--------+--------+--------+------------------+--------+           PSV cm/sEDV cm/sStenosisPlaque DescriptionComments +----------+--------+--------+--------+------------------+--------+ CCA Prox  1       0                                          +----------+--------+--------+--------+------------------+--------+ CCA Distal1       0                                          +----------+--------+--------+--------+------------------+--------+ ICA Prox  1       0       Normal                             +----------+--------+--------+--------+------------------+--------+ ICA Mid   1       0                                          +----------+--------+--------+--------+------------------+--------+ ICA Distal1       0                                          +----------+--------+--------+--------+------------------+--------+ ECA       1       0                                          +----------+--------+--------+--------+------------------+--------+ +----------+--------+-------+----------------+-------------------+  PSV cm/sEDV cmsDescribe        Arm Pressure (mmHG) +----------+--------+-------+----------------+-------------------+ Pleas Koch              Multiphasic, [id number]                 +----------+--------+-------+----------------+-------------------+ +---------+--------+-+--------+---------+ VertebralPSV cm/s0EDV cm/sAntegrade +---------+--------+-+--------+---------+ Right brachial artery has triphasic flow. Left Carotid Findings: +----------+--------+--------+--------+------------------+--------+           PSV cm/sEDV cm/sStenosisPlaque  DescriptionComments +----------+--------+--------+--------+------------------+--------+ CCA Prox  1       0                                          +----------+--------+--------+--------+------------------+--------+ CCA Distal1       0                                          +----------+--------+--------+--------+------------------+--------+ ICA Prox  0       0       Normal                             +----------+--------+--------+--------+------------------+--------+ ICA Mid   1       0                                          +----------+--------+--------+--------+------------------+--------+ ICA Distal1       0                                          +----------+--------+--------+--------+------------------+--------+ ECA       1       0                                          +----------+--------+--------+--------+------------------+--------+ +----------+--------+--------+---------+-------------------+           PSV cm/sEDV cm/sDescribe Arm Pressure (mmHG) +----------+--------+--------+---------+-------------------+ Pleas Koch               [id number]                 +----------+--------+--------+---------+-------------------+ +---------+--------+-+--------+-+---------+ VertebralPSV cm/s1EDV cm/s0Antegrade +---------+--------+-+--------+-+---------+ Left brachial artery has biphasic flow with delayed acceleration.  Summary: Right Carotid: There is no evidence of stenosis in the right ICA. Left Carotid: There is no evidence of stenosis in the left ICA. Vertebrals:  Bilateral vertebral arteries demonstrate antegrade flow. Subclavians: Left subclavian artery was stenotic. Normal flow hemodynamics were              seen in the right subclavian artery. Right brachial triphasic, left              biphasic. *See table(s) above for measurements and observations. Suggest follow up study in 12 months. Electronically signed by Julien Nordmann MD on  03/27/2023 at 5:33:52 PM.    Final

## 2023-04-17 ENCOUNTER — Inpatient Hospital Stay: Payer: Medicare HMO | Admitting: Hematology

## 2023-04-17 VITALS — BP 110/67 | HR 84 | Temp 98.3°F | Resp 16 | Wt 183.6 lb

## 2023-04-17 DIAGNOSIS — D72829 Elevated white blood cell count, unspecified: Secondary | ICD-10-CM

## 2023-04-17 DIAGNOSIS — E785 Hyperlipidemia, unspecified: Secondary | ICD-10-CM | POA: Diagnosis not present

## 2023-04-17 DIAGNOSIS — C9310 Chronic myelomonocytic leukemia not having achieved remission: Secondary | ICD-10-CM

## 2023-04-17 DIAGNOSIS — I10 Essential (primary) hypertension: Secondary | ICD-10-CM | POA: Diagnosis not present

## 2023-04-17 DIAGNOSIS — D72821 Monocytosis (symptomatic): Secondary | ICD-10-CM | POA: Diagnosis not present

## 2023-04-17 DIAGNOSIS — E039 Hypothyroidism, unspecified: Secondary | ICD-10-CM | POA: Diagnosis not present

## 2023-04-17 DIAGNOSIS — Z79890 Hormone replacement therapy (postmenopausal): Secondary | ICD-10-CM | POA: Diagnosis not present

## 2023-04-17 DIAGNOSIS — Z7982 Long term (current) use of aspirin: Secondary | ICD-10-CM | POA: Diagnosis not present

## 2023-04-17 DIAGNOSIS — Z79899 Other long term (current) drug therapy: Secondary | ICD-10-CM | POA: Diagnosis not present

## 2023-04-17 DIAGNOSIS — Z803 Family history of malignant neoplasm of breast: Secondary | ICD-10-CM | POA: Diagnosis not present

## 2023-04-17 NOTE — Patient Instructions (Signed)
[facility] at [facility] Discharge Instructions   You were seen and examined today by Dr. Ellin Saba.  He discussed with you the results of your lab work. Your blood work is stable. It seems that unconfirmed CMML is of the proliferative type. In order to confirm your diagnosis, we will arrange for you to have a bone marrow biopsy.      Thank you for choosing [facility] at [facility] to provide your oncology and hematology care.  To afford each patient quality time with our provider, please arrive at least 15 minutes before your scheduled appointment time.   If you have a lab appointment with the Cancer Center please come in thru the Main Entrance and check in at the main information desk.  You need to re-schedule your appointment should you arrive 10 or more minutes late.  We strive to give you quality time with our providers, and arriving late affects you and other patients whose appointments are after yours.  Also, if you no show three or more times for appointments you may be dismissed from the clinic at the providers discretion.     Again, thank you for choosing [facility].  Our hope is that these requests will decrease the amount of time that you wait before being seen by our physicians.       _____________________________________________________________  Should you have questions after your visit to [facility], please contact our office at [phone] and follow the prompts.  Our office hours are 8:00 a.m. and 4:30 p.m. Monday - Friday.  Please note that voicemails left after 4:00 p.m. may not be returned until the following business day.  We are closed weekends and major holidays.  You do have access to a nurse 24-7, just call the main number to the clinic [phone] and do not press any options, hold on the line and a nurse will answer the phone.    For prescription refill requests, have your pharmacy  contact our office and allow 72 hours.    Due to Covid, you will need to wear a mask upon entering the hospital. If you do not have a mask, a mask will be given to you at the Main Entrance upon arrival. For doctor visits, patients may have 1 support person age 55 or older with them. For treatment visits, patients can not have anyone with them due to social distancing guidelines and our immunocompromised population.

## 2023-04-18 ENCOUNTER — Other Ambulatory Visit: Payer: Medicare HMO

## 2023-04-18 ENCOUNTER — Ambulatory Visit: Payer: Medicare HMO | Admitting: Physician Assistant

## 2023-04-18 DIAGNOSIS — T783XXA Angioneurotic edema, initial encounter: Secondary | ICD-10-CM | POA: Diagnosis not present

## 2023-04-18 DIAGNOSIS — C9310 Chronic myelomonocytic leukemia not having achieved remission: Secondary | ICD-10-CM | POA: Diagnosis not present

## 2023-04-18 DIAGNOSIS — I10 Essential (primary) hypertension: Secondary | ICD-10-CM | POA: Diagnosis not present

## 2023-04-18 DIAGNOSIS — E6609 Other obesity due to excess calories: Secondary | ICD-10-CM | POA: Diagnosis not present

## 2023-04-18 DIAGNOSIS — Z6830 Body mass index (BMI) 30.0-30.9, adult: Secondary | ICD-10-CM | POA: Diagnosis not present

## 2023-04-18 DIAGNOSIS — I358 Other nonrheumatic aortic valve disorders: Secondary | ICD-10-CM | POA: Diagnosis not present

## 2023-04-18 DIAGNOSIS — M5136 Other intervertebral disc degeneration, lumbar region: Secondary | ICD-10-CM | POA: Diagnosis not present

## 2023-05-05 ENCOUNTER — Other Ambulatory Visit: Payer: Self-pay | Admitting: Radiology

## 2023-05-05 DIAGNOSIS — D72829 Elevated white blood cell count, unspecified: Secondary | ICD-10-CM

## 2023-05-05 NOTE — Consult Note (Signed)
Chief Complaint: Patient was seen in consultation today for CT-guided bone marrow biopsy  Referring Physician(s): Katragadda,Sreedhar  Supervising Physician: Marliss Coots  Patient Status: [facility] - Out-pt  History of Present Illness: Kaitlyn Baker is an 82 y.o. female with past medical history of hyperlipidemia, hypertension, Gilbert's disease, hypothyroidism, childhood rheumatic fever who presents now with neutrophilic and monocytic leukocytosis concerning for CMML.  She is scheduled today for CT-guided bone marrow biopsy for further evaluation.  Past Medical History:  Diagnosis Date   Dyslipidemia    History of cardiac monitoring    HTN (hypertension)    Hypothyroidism    Rheumatic fever    as a child    Past Surgical History:  Procedure Laterality Date   COLONOSCOPY N/A 02/02/2015   Procedure: COLONOSCOPY;  Surgeon: Malissa Hippo, MD;  Location: [facility];  Service: Endoscopy;  Laterality: N/A;  930   COLONOSCOPY N/A 09/20/2021   Procedure: COLONOSCOPY;  Surgeon: Malissa Hippo, MD;  Location: [facility];  Service: Endoscopy;  Laterality: N/A;  10:45   ROTATOR CUFF REPAIR  1991   TUBAL LIGATION  1973    Allergies: Methylprednisolone, Morphine, Demerol [meperidine], Versed [midazolam], and Moviprep [peg-kcl-nacl-nasulf-na asc-c]  Medications: Prior to Admission medications   Medication Sig Start Date End Date Taking? Authorizing Provider  acetaminophen (TYLENOL) 500 MG tablet Take 500 mg by mouth daily as needed (pain).    [provider]  amLODipine-benazepril (LOTREL) 10-20 MG per capsule Take 1 capsule by mouth daily.    [provider]  aspirin 81 MG tablet Take 81 mg by mouth daily.    [provider]  Cholecalciferol (VITAMIN D3) 50 MCG (2000 UT) capsule Take 2,000 Units by mouth daily.    [provider]  levothyroxine (SYNTHROID, LEVOTHROID) 50 MCG tablet Take 50 mcg by mouth daily. 07/15/17   [provider]  simvastatin (ZOCOR) 20 MG tablet Take 20 mg by mouth daily. 05/23/16   [provider]     Family History  Problem Relation Age of Onset   Coronary artery disease Father 18       cardiac arrest   Diabetes Father    Breast cancer Paternal Grandmother     Social History   Socioeconomic History   Marital status: Married    Spouse name: Not on file   Number of children: Not on file   Years of education: Not on file   Highest education level: Not on file  Occupational History   Not on file  Tobacco Use   Smoking status: Never   Smokeless tobacco: Never  Vaping Use   Vaping status: Never Used  Substance and Sexual Activity   Alcohol use: Yes    Comment: 1 glass of wine once a week   Drug use: No   Sexual activity: Not on file  Other Topics Concern   Not on file  Social History Narrative   Retired Charity fundraiser   Social Determinants of Health   Financial Resource Strain: Low Risk  (04/05/2021)   Overall Financial Resource Strain (CARDIA)    Difficulty of Paying Living Expenses: Not hard at all  Food Insecurity: No Food Insecurity (04/05/2021)   Hunger Vital Sign    Worried About Running Out of Food in the Last Year: Never true    Ran Out of Food in the Last Year: Never true  Transportation Needs: No Transportation Needs (04/05/2021)   PRAPARE - Administrator, Civil Service (Medical):  No    Lack of Transportation (Non-Medical): No  Physical Activity: Sufficiently Active (04/05/2021)   Exercise Vital Sign    Days of Exercise per Week: 7 days    Minutes of Exercise per Session: 30 min  Stress: No Stress Concern Present (04/05/2021)   [employer] of Occupational Health - Occupational Stress Questionnaire    Feeling of Stress : Not at all  Social Connections: Socially Integrated (04/05/2021)   Social Connection and Isolation Panel [NHANES]    Frequency of Communication with Friends and Family: More than three times a week    Frequency of Social  Gatherings with Friends and Family: More than three times a week    Attends Religious Services: More than 4 times per year    Active Member of [employer] or Organizations: Yes    Attends Banker Meetings: 1 to 4 times per year    Marital Status: Married      Review of Systems; denies fever,HA,CP,dyspnea, cough, abd/back pain,N/V or bleeding  Vital Signs: Vitals:   05/06/23 0704 05/06/23 0713  BP:  134/72  Pulse: 72   Resp: 16   Temp: 97.9 F (36.6 C)        Code Status: FULL CODE  Advance Care Plan: no documents on file    Physical Exam; awake/alert; chest- CTA bilat; heart- RRR; abd- soft,+BS,NT; ext with FROM  Imaging: No results found.  Labs:  CBC: Recent Labs    09/30/22 1257 12/02/22 1405 01/13/23 1114 04/11/23 1245  WBC 12.6* 13.1* 11.4* 13.1*  HGB 13.3 13.8 14.0 13.4  HCT 42.1 42.8 42.8 41.2  PLT 250 250 260 259    COAGS: No results for input(s): "INR", "APTT" in the last 8760 hours.  BMP: No results for input(s): "NA", "K", "CL", "CO2", "GLUCOSE", "BUN", "CALCIUM", "CREATININE", "GFRNONAA", "GFRAA" in the last 8760 hours.  Invalid input(s): "CMP"  LIVER FUNCTION TESTS: No results for input(s): "BILITOT", "AST", "ALT", "ALKPHOS", "PROT", "ALBUMIN" in the last 8760 hours.  TUMOR MARKERS: No results for input(s): "AFPTM", "CEA", "CA199", "CHROMGRNA" in the last 8760 hours.  Assessment and Plan: 82 y.o. female with past medical history of hyperlipidemia, hypertension, Gilbert's disease, hypothyroidism, childhood rheumatic fever who presents now with neutrophilic and monocytic leukocytosis concerning for CMML.  She is scheduled today for CT-guided bone marrow biopsy for further evaluation.Risks and benefits of procedure was discussed with the patient /spouse including, but not limited to bleeding, infection, damage to adjacent structures or low yield requiring additional tests.  All of the questions were answered and there is agreement to  proceed.  Consent signed and in chart.    Thank you for this interesting consult.  I greatly enjoyed meeting Kaitlyn Baker and look forward to participating in their care.  A copy of this report was sent to the requesting provider on this date.  Electronically Signed: D. Jeananne Rama, PA-C 05/05/2023, 1:18 PM   I spent a total of 20 minutes    in face to face in clinical consultation, greater than 50% of which was counseling/coordinating care for CT-guided bone marrow biopsy

## 2023-05-06 ENCOUNTER — Encounter (HOSPITAL_COMMUNITY): Payer: Self-pay

## 2023-05-06 ENCOUNTER — Ambulatory Visit (HOSPITAL_COMMUNITY)
Admission: RE | Admit: 2023-05-06 | Discharge: 2023-05-06 | Disposition: A | Discharge status: Home / Self Care | Payer: Medicare HMO | Source: Ambulatory Visit | Attending: Hematology | Admitting: Hematology

## 2023-05-06 ENCOUNTER — Other Ambulatory Visit: Payer: Self-pay

## 2023-05-06 DIAGNOSIS — D72829 Elevated white blood cell count, unspecified: Secondary | ICD-10-CM

## 2023-05-06 DIAGNOSIS — C9310 Chronic myelomonocytic leukemia not having achieved remission: Secondary | ICD-10-CM | POA: Diagnosis not present

## 2023-05-06 DIAGNOSIS — D759 Disease of blood and blood-forming organs, unspecified: Secondary | ICD-10-CM | POA: Diagnosis not present

## 2023-05-06 LAB — CBC WITH DIFFERENTIAL/PLATELET
Abs Immature Granulocytes: 0.02 10*3/uL (ref 0.00–0.07)
Basophils Absolute: 0 10*3/uL (ref 0.0–0.1)
Basophils Relative: 0 %
Eosinophils Absolute: 0.3 10*3/uL (ref 0.0–0.5)
Eosinophils Relative: 3 %
HCT: 41.8 % (ref 36.0–46.0)
Hemoglobin: 13.4 g/dL (ref 12.0–15.0)
Immature Granulocytes: 0 %
Lymphocytes Relative: 27 %
Lymphs Abs: 2.5 10*3/uL (ref 0.7–4.0)
MCH: 26.8 pg (ref 26.0–34.0)
MCHC: 32.1 g/dL (ref 30.0–36.0)
MCV: 83.6 fL (ref 80.0–100.0)
Monocytes Absolute: 1.4 10*3/uL — ABNORMAL HIGH (ref 0.1–1.0)
Monocytes Relative: 15 %
Neutro Abs: 5.2 10*3/uL (ref 1.7–7.7)
Neutrophils Relative %: 55 %
Platelets: 238 10*3/uL (ref 150–400)
RBC: 5 MIL/uL (ref 3.87–5.11)
RDW: 14.2 % (ref 11.5–15.5)
WBC: 9.3 10*3/uL (ref 4.0–10.5)
nRBC: 0 % (ref 0.0–0.2)

## 2023-05-06 MED ORDER — FENTANYL CITRATE (PF) 100 MCG/2ML IJ SOLN
INTRAMUSCULAR | Status: AC
  Filled 2023-05-06: qty 2

## 2023-05-06 MED ORDER — ONDANSETRON HCL 4 MG/2ML IJ SOLN
INTRAMUSCULAR | Status: AC | PRN
  Administered 2023-05-06: 4 mg via INTRAVENOUS

## 2023-05-06 MED ORDER — FENTANYL CITRATE (PF) 100 MCG/2ML IJ SOLN
INTRAMUSCULAR | Status: AC | PRN
  Administered 2023-05-06: 50 ug via INTRAVENOUS

## 2023-05-06 MED ORDER — LIDOCAINE HCL (PF) 1 % IJ SOLN
INTRAMUSCULAR | Status: AC | PRN
  Administered 2023-05-06: 20 mL

## 2023-05-06 MED ORDER — MIDAZOLAM HCL 2 MG/2ML IJ SOLN
INTRAMUSCULAR | Status: AC
  Filled 2023-05-06: qty 2

## 2023-05-06 MED ORDER — MIDAZOLAM HCL 2 MG/2ML IJ SOLN
INTRAMUSCULAR | Status: AC | PRN
  Administered 2023-05-06: 1 mg via INTRAVENOUS

## 2023-05-06 MED ORDER — SODIUM CHLORIDE 0.9 % IV SOLN: INTRAVENOUS | Status: DC

## 2023-05-06 MED ORDER — ONDANSETRON HCL 4 MG/2ML IJ SOLN
INTRAMUSCULAR | Status: AC
  Filled 2023-05-06: qty 2

## 2023-05-06 NOTE — Procedures (Signed)
 Interventional Radiology Procedure Note  Procedure: CT guided aspirate and core biopsy of right iliac bone  Complications: None  Recommendations: - Bedrest supine x 1 hrs - Hydrocodone PRN  Pain - Follow biopsy results   Dylan Suttle, MD   

## 2023-05-06 NOTE — Discharge Instructions (Signed)
Please call Interventional Radiology clinic [phone] with any questions or concerns. ? ?You may remove your dressing and shower tomorrow. ? ? ?Bone Marrow Aspiration and Bone Marrow Biopsy, Adult, Care After ?This sheet gives you information about how to care for yourself after your procedure. Your health care provider may also give you more specific instructions. If you have problems or questions, contact your health care provider. ?What can I expect after the procedure? ?After the procedure, it is common to have: ?Mild pain and tenderness. ?Swelling. ?Bruising. ?Follow these instructions at home: ?Puncture site care ?Follow instructions from your health care provider about how to take care of the puncture site. Make sure you: ?Wash your hands with soap and water before and after you change your bandage (dressing). If soap and water are not available, use hand sanitizer. ?Change your dressing as told by your health care provider. ?Check your puncture site every day for signs of infection. Check for: ?More redness, swelling, or pain. ?Fluid or blood. ?Warmth. ?Pus or a bad smell.   ?Activity ?Return to your normal activities as told by your health care provider. Ask your health care provider what activities are safe for you. ?Do not lift anything that is heavier than 10 lb (4.5 kg), or the limit that you are told, until your health care provider says that it is safe. ?Do not drive for 24 hours if you were given a sedative during your procedure. ?General instructions ?Take over-the-counter and prescription medicines only as told by your health care provider. ?Do not take baths, swim, or use a hot tub until your health care provider approves. Ask your health care provider if you may take showers. You may only be allowed to take sponge baths. ?If directed, put ice on the affected area. To do this: ?Put ice in a plastic bag. ?Place a towel between your skin and the bag. ?Leave the ice on for 20 minutes, 2-3 times a  day. ?Keep all follow-up visits as told by your health care provider. This is important.   ?Contact a health care provider if: ?Your pain is not controlled with medicine. ?You have a fever. ?You have more redness, swelling, or pain around the puncture site. ?You have fluid or blood coming from the puncture site. ?Your puncture site feels warm to the touch. ?You have pus or a bad smell coming from the puncture site. ?Summary ?After the procedure, it is common to have mild pain, tenderness, swelling, and bruising. ?Follow instructions from your health care provider about how to take care of the puncture site and what activities are safe for you. ?Take over-the-counter and prescription medicines only as told by your health care provider. ?Contact a health care provider if you have any signs of infection, such as fluid or blood coming from the puncture site. ?This information is not intended to replace advice given to you by your health care provider. Make sure you discuss any questions you have with your health care provider. ?Document Revised: 02/23/2019 Document Reviewed: 02/23/2019 ?Elsevier Patient Education ? 2021 [employer] ? ? ?Moderate Conscious Sedation, Adult, Care After ?This sheet gives you information about how to care for yourself after your procedure. Your health care provider may also give you more specific instructions. If you have problems or questions, contact your health care provider. ?What can I expect after the procedure? ?After the procedure, it is common to have: ?Sleepiness for several hours. ?Impaired judgment for several hours. ?Difficulty with balance. ?Vomiting if you eat too   soon. ?Follow these instructions at home: ?For the time period you were told by your health care provider: ?Rest. ?Do not participate in activities where you could fall or become injured. ?Do not drive or use machinery. ?Do not drink alcohol. ?Do not take sleeping pills or medicines that cause drowsiness. ?Do not  make important decisions or sign legal documents. ?Do not take care of children on your own.  ?  ?  ?Eating and drinking ?Follow the diet recommended by your health care provider. ?Drink enough fluid to keep your urine pale yellow. ?If you vomit: ?Drink water, juice, or soup when you can drink without vomiting. ?Make sure you have little or no nausea before eating solid foods.   ?General instructions ?Take over-the-counter and prescription medicines only as told by your health care provider. ?Have a responsible adult stay with you for the time you are told. It is important to have someone help care for you until you are awake and alert. ?Do not smoke. ?Keep all follow-up visits as told by your health care provider. This is important. ?Contact a health care provider if: ?You are still sleepy or having trouble with balance after 24 hours. ?You feel light-headed. ?You keep feeling nauseous or you keep vomiting. ?You develop a rash. ?You have a fever. ?You have redness or swelling around the IV site. ?Get help right away if: ?You have trouble breathing. ?You have new-onset confusion at home. ?Summary ?After the procedure, it is common to feel sleepy, have impaired judgment, or feel nauseous if you eat too soon. ?Rest after you get home. Know the things you should not do after the procedure. ?Follow the diet recommended by your health care provider and drink enough fluid to keep your urine pale yellow. ?Get help right away if you have trouble breathing or new-onset confusion at home. ?This information is not intended to replace advice given to you by your health care provider. Make sure you discuss any questions you have with your health care provider. ?Document Revised: 02/04/2020 Document Reviewed: 09/02/2019 ?Elsevier Patient Education ? 2021 Elsevier [employer].  ?

## 2023-05-12 ENCOUNTER — Encounter (HOSPITAL_COMMUNITY): Payer: Self-pay | Admitting: Hematology

## 2023-05-12 LAB — SURGICAL PATHOLOGY

## 2023-05-13 ENCOUNTER — Encounter (HOSPITAL_COMMUNITY): Payer: Self-pay | Admitting: Hematology

## 2023-05-22 DIAGNOSIS — I10 Essential (primary) hypertension: Secondary | ICD-10-CM | POA: Diagnosis not present

## 2023-05-22 DIAGNOSIS — W5501XA Bitten by cat, initial encounter: Secondary | ICD-10-CM | POA: Diagnosis not present

## 2023-05-22 DIAGNOSIS — Z23 Encounter for immunization: Secondary | ICD-10-CM | POA: Diagnosis not present

## 2023-05-22 DIAGNOSIS — C9310 Chronic myelomonocytic leukemia not having achieved remission: Secondary | ICD-10-CM | POA: Diagnosis not present

## 2023-05-26 NOTE — Progress Notes (Signed)
[name] [address] [place] [address]    Clinic Day:  05/27/2023  Referring physician: Elfredia Nevins, MD  Patient Care Team: Kaitlyn Nevins, MD as PCP - General (Internal Medicine) Kaitlyn Gess, MD as PCP - Cardiology (Cardiology) Kaitlyn Hippo, MD (Inactive) as Consulting Physician (Gastroenterology)   ASSESSMENT & PLAN:   Assessment: 1.  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  Prior to that her white count was 11.6 with ANC of 7.2 and absolute monocyte count of 1.5. - Work-up unremarkable (04/06/2021):  JAK2, CALR, and MPL were negative; BCR/ABL negative; rheumatoid factor and ANA negative; LDH, CRP and ESR within normal limits - She is a lifelong non-smoker.  No history of splenectomy.  No known autoimmune or inflammatory conditions.   - No splenomegaly or lymphadenopathy appreciated on exam - No chronic systemic steroids.  Received steroid injection into her shoulder on 05/14/2022  - She denies any B symptoms or recent infections. - Lab trends:       -- (10/31/2021): Normal WBC 9.7, persistently elevated monocytes 1.3       -- (05/30/2022 - drawn by PCP 2 weeks after steroid injection): WBC 18.1, with ANC 12.1 and monocytes 2.3       -- (06/13/2022): WBC trending down at 14.9/ANC 10.1/monocytes 2.0.  LDH normal.       -- (07/19/2022): WBC 13.1, ANC 8.8, monocytes 1.8 - CBC today (01/13/2023): WBC 11.4/monocytes 1.7 (15%)/ANC 7.0 - [facility] myeloid panel (12/23/2022):       -- ASXL1 detected: Highly associated with CMML and also frequently seen in AML, MPN, MDS, and age-related clonal hematopoiesis       -- NRAS detected: Contributes to phenotype similar to CMML or AML like disease and MDS. - DIFFERENTIAL DIAGNOSIS: Consider likely Chronic Myelomonocytic Leukemia  (CMML) in the setting of persistent monocytic and neutrophilic leukocytosis and ASXL1 and NRAS mutations.       -- dDx also includes age-related clonal hematopoiesis [facility]), which is sometimes considered preleukemic and has been associated with increased risk for blood cancers.       -- dDx also includes other MDS and MPN syndromes  2.  Social/family history: - She has Gilbert's disease -- She is a retired Designer, jewellery.  Never smoker.  No exposure to chemicals. - No family history of leukemia.  Paternal grandmother had breast cancer.   Plan: 1.  Neutrophilic and monocytic leukocytosis, suspected Chronic Myelomonocytic Leukemia (CMML):  - We have reviewed her labs from 04/11/2023.  White count is elevated and stable with neutrophilic leukocytosis and monocytosis.  No other cytopenias.  LDH normal. - We discussed the differential diagnosis of CMML. - She does not have any B symptoms/recurrent infections. - Physical exam: No palpable spleen. - We will proceed with bone marrow biopsy with cytogenetics and MDS FISH panel. - RTC 3 weeks after bone marrow biopsy to discuss results and further plan.    No orders of the defined types were placed in this encounter.     Kaitlyn Baker,acting as a Neurosurgeon for Kaitlyn Massed, MD.,have documented all relevant documentation on the behalf of Kaitlyn Massed, MD,as directed by  Kaitlyn Massed, MD while in the presence of Kaitlyn Massed, MD.  ***   Kaitlyn Baker   8/5/20249:03 PM  CHIEF COMPLAINT:   Diagnosis:  leukocytosis with monocytosis (suspected CMML)  Cancer Staging  No matching staging information was found for the patient.    Prior Therapy: none  Current Therapy:  surveillance   HISTORY OF PRESENT ILLNESS:   Oncology History   No history exists.     INTERVAL HISTORY:   Kaitlyn Baker is a 82 y.o. female presenting to clinic today for follow up of  leukocytosis with monocytosis (suspected CMML). She was  last seen by me on 04/17/23.  Since her last visit, she had a bone marrow biopsy on 05/06/23. Surgical pathology of the bone marrow, aspirate, clot, core revealed: cellular bone marrow involved by chronic lymphocytic leukemia (approximately 10% of cellularity by CD20 immunohistochemical analysis). The peripheral blood revealed monocytosis. The flow pathology report revealed abnormal clonal B-cell population constituting 59% of lymphocytes.  The B cells are positive for CD5, CD19, CD20, CD200, HLA-DR and express kappa light chains. Cytogenics and molecular pathology reports were normal.   Today, she states that she is doing well overall. Her appetite level is at ***%. Her energy level is at ***%.  PAST MEDICAL HISTORY:   Past Medical History: Past Medical History:  Diagnosis Date   Dyslipidemia    History of cardiac monitoring    HTN (hypertension)    Hypothyroidism    Rheumatic fever    as a child    Surgical History: Past Surgical History:  Procedure Laterality Date   COLONOSCOPY N/A 02/02/2015   Procedure: COLONOSCOPY;  Surgeon: Kaitlyn Hippo, MD;  [facility];  Service: Endoscopy;  Laterality: N/A;  930   COLONOSCOPY N/A 09/20/2021   Procedure: COLONOSCOPY;  Surgeon: Kaitlyn Hippo, MD;  Location: [facility];  Service: Endoscopy;  Laterality: N/A;  10:45   ROTATOR CUFF REPAIR  1991   TUBAL LIGATION  1973    Social History: Social History   Socioeconomic History   Marital status: Married    Spouse name: Not on file   Number of children: Not on file   Years of education: Not on file   Highest education level: Not on file  Occupational History   Not on file  Tobacco Use   Smoking status: Never   Smokeless tobacco: Never  Vaping Use   Vaping status: Never Used  Substance and Sexual Activity   Alcohol use: Yes    Comment: 1 glass of wine once a week   Drug use: No   Sexual activity: Not on file  Other Topics Concern   Not on file  Social History  Narrative   Retired Charity fundraiser   Social Determinants of Health   Financial Resource Strain: Low Risk  (04/05/2021)   Overall Financial Resource Strain (CARDIA)    Difficulty of Paying Living Expenses: Not hard at all  Food Insecurity: No Food Insecurity (04/05/2021)   Hunger Vital Sign    Worried About Running Out of Food in the Last Year: Never true    Ran Out of Food in the Last Year: Never true  Transportation Needs: No Transportation Needs (04/05/2021)   PRAPARE - Administrator, Civil Service (Medical): No    Lack of Transportation (Non-Medical): No  Physical Activity: Sufficiently Active (04/05/2021)   Exercise Vital Sign    Days of Exercise per Week: 7 days    Minutes of Exercise per Session: 30 min  Stress: No Stress Concern Present (04/05/2021)   [employer] of Occupational Health - Occupational Stress Questionnaire    Feeling of Stress : Not at all  Social Connections: Socially Integrated (  04/05/2021)   Social Connection and Isolation Panel [NHANES]    Frequency of Communication with Friends and Family: More than three times a week    Frequency of Social Gatherings with Friends and Family: More than three times a week    Attends Religious Services: More than 4 times per year    Active Member of [employer] or Organizations: Yes    Attends Banker Meetings: 1 to 4 times per year    Marital Status: Married  Catering manager Violence: Not At Risk (04/05/2021)   Humiliation, Afraid, Rape, and Kick questionnaire    Fear of Current or Ex-Partner: No    Emotionally Abused: No    Physically Abused: No    Sexually Abused: No    Family History: Family History  Problem Relation Age of Onset   Coronary artery disease Father 32       cardiac arrest   Diabetes Father    Breast cancer Paternal Grandmother     Current Medications:  Current Outpatient Medications:    acetaminophen (TYLENOL) 500 MG tablet, Take 500 mg by mouth daily as needed (pain)., Disp: , Rfl:     amLODipine-benazepril (LOTREL) 10-20 MG per capsule, Take 1 capsule by mouth daily., Disp: , Rfl:    aspirin 81 MG tablet, Take 81 mg by mouth daily., Disp: , Rfl:    Cholecalciferol (VITAMIN D3) 50 MCG (2000 UT) capsule, Take 2,000 Units by mouth daily., Disp: , Rfl:    levothyroxine (SYNTHROID, LEVOTHROID) 50 MCG tablet, Take 50 mcg by mouth daily., Disp: , Rfl:    simvastatin (ZOCOR) 20 MG tablet, Take 20 mg by mouth daily., Disp: , Rfl:    Allergies: Allergies  Allergen Reactions   Methylprednisolone Anaphylaxis and Rash    Injection medication-Severe rash and edema-tongue and vocal cords   Morphine Anaphylaxis   Demerol [Meperidine] Swelling    Patient developed tongue swelling. He was treated with Benadryl and prednisone by PCP and is fine   Versed [Midazolam] Nausea And Vomiting    Hard time coming from under   Moviprep [Peg-Kcl-Nacl-Nasulf-Na Asc-C] Nausea And Vomiting    Severe itching all over, itching in the back of the throat, and lip swelling    REVIEW OF SYSTEMS:   Review of Systems  Constitutional:  Negative for chills, fatigue and fever.  HENT:   Negative for lump/mass, mouth sores, nosebleeds, sore throat and trouble swallowing.   Eyes:  Negative for eye problems.  Respiratory:  Negative for cough and shortness of breath.   Cardiovascular:  Negative for chest pain, leg swelling and palpitations.  Gastrointestinal:  Negative for abdominal pain, constipation, diarrhea, nausea and vomiting.  Genitourinary:  Negative for bladder incontinence, difficulty urinating, dysuria, frequency, hematuria and nocturia.   Musculoskeletal:  Negative for arthralgias, back pain, flank pain, myalgias and neck pain.  Skin:  Negative for itching and rash.  Neurological:  Negative for dizziness, headaches and numbness.  Hematological:  Does not bruise/bleed easily.  Psychiatric/Behavioral:  Negative for depression, sleep disturbance and suicidal ideas. The patient is not  nervous/anxious.   All other systems reviewed and are negative.    VITALS:   There were no vitals taken for this visit.  Wt Readings from Last 3 Encounters:  05/06/23 180 lb (81.6 kg)  04/17/23 183 lb 9.6 oz (83.3 kg)  03/27/23 183 lb 3.2 oz (83.1 kg)    There is no height or weight on file to calculate BMI.  Performance status (ECOG): 1 - Symptomatic but  completely ambulatory  PHYSICAL EXAM:   Physical Exam Vitals and nursing note reviewed. Exam conducted with a chaperone present.  Constitutional:      Appearance: Normal appearance.  Cardiovascular:     Rate and Rhythm: Normal rate and regular rhythm.     Pulses: Normal pulses.     Heart sounds: Normal heart sounds.  Pulmonary:     Effort: Pulmonary effort is normal.     Breath sounds: Normal breath sounds.  Abdominal:     Palpations: Abdomen is soft. There is no hepatomegaly, splenomegaly or mass.     Tenderness: There is no abdominal tenderness.  Musculoskeletal:     Right lower leg: No edema.     Left lower leg: No edema.  Lymphadenopathy:     Cervical: No cervical adenopathy.     Right cervical: No superficial, deep or posterior cervical adenopathy.    Left cervical: No superficial, deep or posterior cervical adenopathy.     Upper Body:     Right upper body: No supraclavicular or axillary adenopathy.     Left upper body: No supraclavicular or axillary adenopathy.  Neurological:     General: No focal deficit present.     Mental Status: She is alert and oriented to person, place, and time.  Psychiatric:        Mood and Affect: Mood normal.        Behavior: Behavior normal.     LABS:      Latest Ref Rng & Units 05/06/2023    7:16 AM 04/11/2023   12:45 PM 01/13/2023   11:14 AM  CBC  WBC 4.0 - 10.5 K/uL 9.3  13.1  11.4   Hemoglobin 12.0 - 15.0 g/dL 56.2  13.0  86.5   Hematocrit 36.0 - 46.0 % 41.8  41.2  42.8   Platelets 150 - 400 K/uL 238  259  260       Latest Ref Rng & Units 10/31/2021    8:17 AM  08/21/2021    1:36 PM 02/08/2020    5:35 PM  CMP  Glucose 70 - 99 mg/dL 784  85  696   BUN 8 - 23 mg/dL 17  14  15    Creatinine 0.44 - 1.00 mg/dL 2.95  2.84  1.32   Sodium 135 - 145 mmol/L 140  140  137   Potassium 3.5 - 5.1 mmol/L 4.3  4.3  4.0   Chloride 98 - 111 mmol/L 108  106  104   CO2 22 - 32 mmol/L 27  23  23    Calcium 8.9 - 10.3 mg/dL 9.1  9.9  9.6   Total Protein 6.5 - 8.1 g/dL 6.5  6.8    Total Bilirubin 0.3 - 1.2 mg/dL 0.9  2.4    Alkaline Phos 38 - 126 U/L 78     AST 15 - 41 U/L 16  15    ALT 0 - 44 U/L 19  14       No results found for: "CEA1", "CEA" / No results found for: "CEA1", "CEA" No results found for: "PSA1" No results found for: "GMW102" No results found for: "CAN125"  No results found for: "TOTALPROTELP", "ALBUMINELP", "A1GS", "A2GS", "BETS", "BETA2SER", "GAMS", "MSPIKE", "SPEI" No results found for: "TIBC", "FERRITIN", "IRONPCTSAT" Lab Results  Component Value Date   LDH 117 04/11/2023   LDH 141 12/02/2022   LDH 141 09/30/2022     STUDIES:   CT BONE MARROW BIOPSY & ASPIRATION  Result Date: 05/06/2023 INDICATION:  82 year old female with concern for CMML. EXAM: CT-GUIDED BONE MARROW BIOPSY AND ASPIRATION MEDICATIONS: 4 mg Zofran, intravenous ANESTHESIA/SEDATION: Fentanyl 50 mcg IV; Versed 1 mg IV Sedation Time: 10 minutes; The patient was continuously monitored during the procedure by the interventional radiology nurse under my direct supervision. COMPLICATIONS: None immediate. PROCEDURE: Informed consent was obtained from the patient following an explanation of the procedure, risks, benefits and alternatives. The patient understands, agrees and consents for the procedure. All questions were addressed. A time out was performed prior to the initiation of the procedure. The patient was positioned prone and non-contrast localization CT was performed of the pelvis to demonstrate the iliac marrow spaces. The operative site was prepped and draped in the usual  sterile fashion. Under sterile conditions and local anesthesia, a 22 gauge spinal needle was utilized for procedural planning. Next, an 11 gauge coaxial bone biopsy needle was advanced into the right iliac marrow space. Needle position was confirmed with CT imaging. Initially, a bone marrow aspiration was performed. Next, a bone marrow biopsy was obtained with the 11 gauge outer bone marrow device. Samples were prepared with the cytotechnologist and deemed adequate. The needle was removed and superficial hemostasis was obtained with manual compression. A dressing was applied. The patient tolerated the procedure well without immediate post procedural complication. IMPRESSION: Successful CT guided right iliac bone marrow aspiration and core biopsy. Marliss Coots, MD [facility] Electronically Signed   By: Marliss Coots M.D.   On: 05/06/2023 11:43

## 2023-05-27 ENCOUNTER — Inpatient Hospital Stay: Payer: Medicare HMO | Attending: Physician Assistant | Admitting: Hematology

## 2023-05-27 VITALS — BP 120/63 | HR 87 | Temp 97.9°F | Resp 18 | Wt 187.5 lb

## 2023-05-27 DIAGNOSIS — C9110 Chronic lymphocytic leukemia of B-cell type not having achieved remission: Secondary | ICD-10-CM | POA: Insufficient documentation

## 2023-05-27 DIAGNOSIS — D72829 Elevated white blood cell count, unspecified: Secondary | ICD-10-CM

## 2023-05-27 DIAGNOSIS — D72821 Monocytosis (symptomatic): Secondary | ICD-10-CM

## 2023-05-27 NOTE — Patient Instructions (Addendum)
[facility]  Discharge Instructions  You were seen and examined today by Dr. Ellin Saba.  Dr. Ellin Saba discussed your most recent lab work which revealed everything looks good.  Follow-up as scheduled in 6 months.    Thank you for choosing [facility] to provide your oncology and hematology care.   To afford each patient quality time with our provider, please arrive at least 15 minutes before your scheduled appointment time. You may need to reschedule your appointment if you arrive late (10 or more minutes). Arriving late affects you and other patients whose appointments are after yours.  Also, if you miss three or more appointments without notifying the office, you may be dismissed from the clinic at the provider's discretion.    Again, thank you for choosing [facility].  Our hope is that these requests will decrease the amount of time that you wait before being seen by our physicians.   If you have a lab appointment with the Cancer Center - please note that after April 8th, all labs will be drawn in the cancer center.  You do not have to check in or register with the main entrance as you have in the past but will complete your check-in at the cancer center.            _____________________________________________________________  Should you have questions after your visit to [facility], please contact our office at [phone] and follow the prompts.  Our office hours are 8:00 a.m. to 4:30 p.m. Monday - Thursday and 8:00 a.m. to 2:30 p.m. Friday.  Please note that voicemails left after 4:00 p.m. may not be returned until the following business day.  We are closed weekends and all major holidays.  You do have access to a nurse 24-7, just call the main number to the clinic [phone] and do not press any options, hold on the line and a nurse will answer the phone.    For prescription refill requests, have  your pharmacy contact our office and allow 72 hours.    Masks are no longer required in the cancer centers. If you would like for your care team to wear a mask while they are taking care of you, please let them know. You may have one support person who is at least 82 years old accompany you for your appointments.

## 2023-05-28 ENCOUNTER — Ambulatory Visit: Payer: Medicare HMO | Admitting: Hematology

## 2023-06-09 DIAGNOSIS — E039 Hypothyroidism, unspecified: Secondary | ICD-10-CM | POA: Diagnosis not present

## 2023-06-09 DIAGNOSIS — Z0001 Encounter for general adult medical examination with abnormal findings: Secondary | ICD-10-CM | POA: Diagnosis not present

## 2023-06-09 DIAGNOSIS — E559 Vitamin D deficiency, unspecified: Secondary | ICD-10-CM | POA: Diagnosis not present

## 2023-06-09 DIAGNOSIS — D518 Other vitamin B12 deficiency anemias: Secondary | ICD-10-CM | POA: Diagnosis not present

## 2023-10-20 ENCOUNTER — Other Ambulatory Visit: Payer: Self-pay | Admitting: Obstetrics and Gynecology

## 2023-10-20 DIAGNOSIS — Z1231 Encounter for screening mammogram for malignant neoplasm of breast: Secondary | ICD-10-CM

## 2023-11-20 ENCOUNTER — Inpatient Hospital Stay: Payer: Medicare HMO | Attending: Hematology

## 2023-11-20 DIAGNOSIS — D72821 Monocytosis (symptomatic): Secondary | ICD-10-CM

## 2023-11-20 DIAGNOSIS — D72829 Elevated white blood cell count, unspecified: Secondary | ICD-10-CM

## 2023-11-20 DIAGNOSIS — C9110 Chronic lymphocytic leukemia of B-cell type not having achieved remission: Secondary | ICD-10-CM | POA: Insufficient documentation

## 2023-11-20 LAB — CBC WITH DIFFERENTIAL/PLATELET
Abs Immature Granulocytes: 0.05 10*3/uL (ref 0.00–0.07)
Basophils Absolute: 0.1 10*3/uL (ref 0.0–0.1)
Basophils Relative: 1 %
Eosinophils Absolute: 0.4 10*3/uL (ref 0.0–0.5)
Eosinophils Relative: 3 %
HCT: 44.5 % (ref 36.0–46.0)
Hemoglobin: 14.4 g/dL (ref 12.0–15.0)
Immature Granulocytes: 0 %
Lymphocytes Relative: 22 %
Lymphs Abs: 3.1 10*3/uL (ref 0.7–4.0)
MCH: 27.1 pg (ref 26.0–34.0)
MCHC: 32.4 g/dL (ref 30.0–36.0)
MCV: 83.8 fL (ref 80.0–100.0)
Monocytes Absolute: 2.3 10*3/uL — ABNORMAL HIGH (ref 0.1–1.0)
Monocytes Relative: 17 %
Neutro Abs: 7.7 10*3/uL (ref 1.7–7.7)
Neutrophils Relative %: 57 %
Platelets: 293 10*3/uL (ref 150–400)
RBC: 5.31 MIL/uL — ABNORMAL HIGH (ref 3.87–5.11)
RDW: 14.3 % (ref 11.5–15.5)
WBC: 13.7 10*3/uL — ABNORMAL HIGH (ref 4.0–10.5)
nRBC: 0 % (ref 0.0–0.2)

## 2023-11-20 LAB — LACTATE DEHYDROGENASE: LDH: 131 U/L (ref 98–192)

## 2023-11-25 NOTE — Progress Notes (Signed)
 [facility] [address], [place] [address]    Clinic Day:  11/27/23   Referring physician: Bertell Satterfield, MD  Patient Care Team: Bertell Satterfield, MD as PCP - General (Internal Medicine) Court Dorn PARAS, MD as PCP - Cardiology (Cardiology) Golda Claudis PENNER, MD (Inactive) as Consulting Physician (Gastroenterology)   ASSESSMENT & PLAN:   Assessment: 1.  CLL/SLL involving bone marrow: - She was initially seen at our clinic for leukocytosis, monocytosis. - IntelliGEN myeloid panel (12/23/2022): ASXL1 and NRAS - BMBx (05/06/2023): Cellular marrow with no dysplastic findings.  Trilineage hematopoiesis is present.  Monocytes does not appear increased.  Morphology and flow cytometry showed CD5/CD19 positive kappa restricted B-cell population constituting 59% of the lymphocytes.  IHC of these lymphocytes consistent with CLL (10% of cellularity).  Cytogenetics showed normal female chromosome.  MDS FISH panel was negative.   2.  Social/family history: - She has Gilbert's disease -- She is a retired designer, jewellery.  Never smoker.  No exposure to chemicals. - No family history of leukemia.  Paternal grandmother had breast cancer.   Plan: 1.  CLL/SLL involving bone marrow: - Denies any fevers, night sweats or weight loss in the last 6 months.  Denies any infections. - Physical exam: No palpable adenopathy or splenomegaly. - Reviewed labs from 11/20/2023: WBC 13.7, predominantly monocytes.  Hemoglobin and platelets are normal.  LDH is normal. - No indication for treatment at this time.  RTC 6 months for follow-up with repeat labs and exam.    Orders Placed This Encounter  Procedures   CBC with Differential    Standing Status:   Future    Expected Date:   05/19/2024    Expiration Date:   11/26/2024   Lactate dehydrogenase    Standing Status:   Future    Expected Date:   05/19/2024    Expiration Date:   11/26/2024      [name],acting as a scribe for  [name], MD.,have documented all relevant documentation on the behalf of [name], MD,as directed by  [name], MD while in the presence of [name], MD.  I, [name] MD, have reviewed the above documentation for accuracy and completeness, and I agree with the above.     [name], MD   2/6/20254:58 PM  CHIEF COMPLAINT:   Diagnosis:  leukocytosis with monocytosis (suspected CMML)    Cancer Staging  CLL (chronic lymphocytic leukemia) (HCC) Staging form: Chronic Lymphocytic Leukemia / Small Lymphocytic Lymphoma, AJCC 8th Edition - Clinical stage from 05/27/2023: [place]: Stage IV, Lymphocytosis: Absent, Adenopathy: Absent, Organomegaly: Absent, Anemia: Absent, Thrombocytopenia: Absent - Unsigned    Prior Therapy: none  Current Therapy:  surveillance   HISTORY OF PRESENT ILLNESS:   Oncology History   No history exists.     INTERVAL HISTORY:   Kaitlyn Baker is a 83 y.o. female presenting to clinic today for follow up of  leukocytosis with monocytosis (suspected CMML). She was last seen by me on 05/27/23.  Today, she states that she is doing well overall. Her appetite level is at 100%. Her energy level is at 75%. The patient denies any fevers, night sweats, or unintentional weight loss. She reports she had a virus in December 2024 and has a lingering cough, that she believes may be chronic. [name] also notes a feeling of fullness in the abdomen.   PAST MEDICAL HISTORY:   Past Medical History: Past Medical History:  Diagnosis Date   Dyslipidemia  History of cardiac monitoring    HTN (hypertension)    Hypothyroidism    Rheumatic fever    as a child    Surgical History: Past Surgical History:  Procedure Laterality Date   COLONOSCOPY N/A 02/02/2015   Procedure: COLONOSCOPY;  Surgeon: Claudis RAYMOND Rivet, MD;  Location: [facility];  Service: Endoscopy;  Laterality: N/A;  930   COLONOSCOPY N/A 09/20/2021   Procedure:  COLONOSCOPY;  Surgeon: Rivet Claudis RAYMOND, MD;  Location: [facility];  Service: Endoscopy;  Laterality: N/A;  10:45   ROTATOR CUFF REPAIR  1991   TUBAL LIGATION  1973    Social History: Social History   Socioeconomic History   Marital status: Married    Spouse name: Not on file   Number of children: Not on file   Years of education: Not on file   Highest education level: Not on file  Occupational History   Not on file  Tobacco Use   Smoking status: Never   Smokeless tobacco: Never  Vaping Use   Vaping status: Never Used  Substance and Sexual Activity   Alcohol use: Yes    Comment: 1 glass of wine once a week   Drug use: No   Sexual activity: Not on file  Other Topics Concern   Not on file  Social History Narrative   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   Social Drivers of Corporate Investment Banker Strain: Low Risk  (04/05/2021)   Overall Financial Resource Strain (CARDIA)    Difficulty of Paying Living Expenses: Not hard at all  Food Insecurity: No Food Insecurity (04/05/2021)   Hunger Vital Sign    Worried About Running Out of Food in the Last Year: Never true    Ran Out of Food in the Last Year: Never true  Transportation Needs: No Transportation Needs (04/05/2021)   PRAPARE - Administrator, Civil Service (Medical): No    Lack of Transportation (Non-Medical): No  Physical Activity: Sufficiently Active (04/05/2021)   Exercise Vital Sign    Days of Exercise per Week: 7 days    Minutes of Exercise per Session: 30 min  Stress: No Stress Concern Present (04/05/2021)   [employer] of Occupational Health - Occupational Stress Questionnaire    Feeling of Stress : Not at all  Social Connections: Socially Integrated (04/05/2021)   Social Connection and Isolation Panel [NHANES]    Frequency of Communication with Friends and Family: More than three times a week    Frequency of Social Gatherings with Friends and Family: More than three times a week    Attends Religious Services: More  than 4 times per year    Active Member of [employer] or Organizations: Yes    Attends Banker Meetings: 1 to 4 times per year    Marital Status: Married  Catering Manager Violence: Not At Risk (04/05/2021)   Humiliation, Afraid, Rape, and Kick questionnaire    Fear of Current or Ex-Partner: No    Emotionally Abused: No    Physically Abused: No    Sexually Abused: No    Family History: Family History  Problem Relation Age of Onset   Coronary artery disease Father 62       cardiac arrest   Diabetes Father    Breast cancer Paternal Grandmother     Current Medications:  Current Outpatient Medications:    acetaminophen  (TYLENOL ) 500 MG tablet, Take 500 mg by mouth daily as needed (pain)., Disp: , Rfl:  amLODipine-benazepril (LOTREL) 10-20 MG per capsule, Take 1 capsule by mouth daily., Disp: , Rfl:    aspirin 81 MG tablet, Take 81 mg by mouth daily., Disp: , Rfl:    chlorzoxazone (PARAFON) 500 MG tablet, Take 500 mg by mouth 3 (three) times daily., Disp: , Rfl:    Cholecalciferol (VITAMIN D3) 50 MCG (2000 UT) capsule, Take 2,000 Units by mouth daily., Disp: , Rfl:    levothyroxine (SYNTHROID, LEVOTHROID) 50 MCG tablet, Take 50 mcg by mouth daily., Disp: , Rfl:    simvastatin (ZOCOR) 20 MG tablet, Take 20 mg by mouth daily., Disp: , Rfl:    Allergies: Allergies  Allergen Reactions   Methylprednisolone  Anaphylaxis and Rash    Injection medication-Severe rash and edema-tongue and vocal cords   Morphine Anaphylaxis   Demerol  [Meperidine ] Swelling    Patient developed tongue swelling. He was treated with Benadryl  and prednisone by PCP and is fine   Ibuprofen Swelling   Versed  [Midazolam ] Nausea And Vomiting    Hard time coming from under   Moviprep [Peg-Kcl-Nacl-Nasulf-Na Asc-C] Nausea And Vomiting    Severe itching all over, itching in the back of the throat, and lip swelling    REVIEW OF SYSTEMS:   Review of Systems  Constitutional:  Negative for chills, fatigue  and fever.  HENT:   Negative for lump/mass, mouth sores, nosebleeds, sore throat and trouble swallowing.   Eyes:  Negative for eye problems.  Respiratory:  Positive for cough. Negative for shortness of breath.   Cardiovascular:  Negative for chest pain, leg swelling and palpitations.  Gastrointestinal:  Negative for abdominal pain, constipation, diarrhea, nausea and vomiting.  Genitourinary:  Negative for bladder incontinence, difficulty urinating, dysuria, frequency, hematuria and nocturia.   Musculoskeletal:  Negative for arthralgias, back pain, flank pain, myalgias and neck pain.  Skin:  Negative for itching and rash.  Neurological:  Negative for dizziness, headaches and numbness.  Hematological:  Does not bruise/bleed easily.  Psychiatric/Behavioral:  Negative for depression, sleep disturbance and suicidal ideas. The patient is not nervous/anxious.   All other systems reviewed and are negative.    VITALS:   Blood pressure 110/74, pulse 78, temperature 98 F (36.7 C), temperature source Oral, resp. rate 16, weight 187 lb 2.7 oz (84.9 kg), SpO2 100%.  Wt Readings from Last 3 Encounters:  11/27/23 187 lb 2.7 oz (84.9 kg)  05/27/23 187 lb 8 oz (85 kg)  05/06/23 180 lb (81.6 kg)    Body mass index is 30.21 kg/m.  Performance status (ECOG): 1 - Symptomatic but completely ambulatory  PHYSICAL EXAM:   Physical Exam Vitals and nursing note reviewed. Exam conducted with a chaperone present.  Constitutional:      Appearance: Normal appearance.  Cardiovascular:     Rate and Rhythm: Normal rate and regular rhythm.     Pulses: Normal pulses.     Heart sounds: Normal heart sounds.  Pulmonary:     Effort: Pulmonary effort is normal.     Breath sounds: Normal breath sounds.  Abdominal:     Palpations: Abdomen is soft. There is no hepatomegaly, splenomegaly or mass.     Tenderness: There is no abdominal tenderness.  Musculoskeletal:     Right lower leg: No edema.     Left lower leg:  No edema.  Lymphadenopathy:     Cervical: No cervical adenopathy.     Right cervical: No superficial, deep or posterior cervical adenopathy.    Left cervical: No superficial, deep or posterior cervical adenopathy.  Upper Body:     Right upper body: No supraclavicular or axillary adenopathy.     Left upper body: No supraclavicular or axillary adenopathy.  Neurological:     General: No focal deficit present.     Mental Status: She is alert and oriented to person, place, and time.  Psychiatric:        Mood and Affect: Mood normal.        Behavior: Behavior normal.     LABS:      Latest Ref Rng & Units 11/20/2023    8:15 AM 05/06/2023    7:16 AM 04/11/2023   12:45 PM  CBC  WBC 4.0 - 10.5 K/uL 13.7  9.3  13.1   Hemoglobin 12.0 - 15.0 g/dL 85.5  86.5  86.5   Hematocrit 36.0 - 46.0 % 44.5  41.8  41.2   Platelets 150 - 400 K/uL 293  238  259       Latest Ref Rng & Units 10/31/2021    8:17 AM 08/21/2021    1:36 PM 02/08/2020    5:35 PM  CMP  Glucose 70 - 99 mg/dL 882  85  864   BUN 8 - 23 mg/dL 17  14  15    Creatinine 0.44 - 1.00 mg/dL 9.24  9.24  9.20   Sodium 135 - 145 mmol/L 140  140  137   Potassium 3.5 - 5.1 mmol/L 4.3  4.3  4.0   Chloride 98 - 111 mmol/L 108  106  104   CO2 22 - 32 mmol/L 27  23  23    Calcium 8.9 - 10.3 mg/dL 9.1  9.9  9.6   Total Protein 6.5 - 8.1 g/dL 6.5  6.8    Total Bilirubin 0.3 - 1.2 mg/dL 0.9  2.4    Alkaline Phos 38 - 126 U/L 78     AST 15 - 41 U/L 16  15    ALT 0 - 44 U/L 19  14       No results found for: CEA1, CEA / No results found for: CEA1, CEA No results found for: PSA1 No results found for: CAN199 No results found for: CAN125  No results found for: TOTALPROTELP, ALBUMINELP, A1GS, A2GS, BETS, BETA2SER, GAMS, MSPIKE, SPEI No results found for: TIBC, FERRITIN, IRONPCTSAT Lab Results  Component Value Date   LDH 131 11/20/2023   LDH 117 04/11/2023   LDH 141 12/02/2022     STUDIES:   No  results found.

## 2023-11-27 ENCOUNTER — Inpatient Hospital Stay: Payer: Medicare HMO | Attending: Hematology | Admitting: Hematology

## 2023-11-27 VITALS — BP 110/74 | HR 78 | Temp 98.0°F | Resp 16 | Wt 187.2 lb

## 2023-11-27 DIAGNOSIS — Z79890 Hormone replacement therapy (postmenopausal): Secondary | ICD-10-CM | POA: Diagnosis not present

## 2023-11-27 DIAGNOSIS — E785 Hyperlipidemia, unspecified: Secondary | ICD-10-CM | POA: Diagnosis not present

## 2023-11-27 DIAGNOSIS — Z79899 Other long term (current) drug therapy: Secondary | ICD-10-CM | POA: Insufficient documentation

## 2023-11-27 DIAGNOSIS — I10 Essential (primary) hypertension: Secondary | ICD-10-CM | POA: Diagnosis not present

## 2023-11-27 DIAGNOSIS — Z7982 Long term (current) use of aspirin: Secondary | ICD-10-CM | POA: Insufficient documentation

## 2023-11-27 DIAGNOSIS — R059 Cough, unspecified: Secondary | ICD-10-CM | POA: Insufficient documentation

## 2023-11-27 DIAGNOSIS — Z803 Family history of malignant neoplasm of breast: Secondary | ICD-10-CM | POA: Insufficient documentation

## 2023-11-27 DIAGNOSIS — C9110 Chronic lymphocytic leukemia of B-cell type not having achieved remission: Secondary | ICD-10-CM | POA: Insufficient documentation

## 2023-11-27 DIAGNOSIS — E069 Thyroiditis, unspecified: Secondary | ICD-10-CM | POA: Diagnosis not present

## 2023-11-27 NOTE — Patient Instructions (Signed)

## 2023-12-01 DIAGNOSIS — H40013 Open angle with borderline findings, low risk, bilateral: Secondary | ICD-10-CM | POA: Diagnosis not present

## 2023-12-01 DIAGNOSIS — H5203 Hypermetropia, bilateral: Secondary | ICD-10-CM | POA: Diagnosis not present

## 2023-12-01 DIAGNOSIS — H04123 Dry eye syndrome of bilateral lacrimal glands: Secondary | ICD-10-CM | POA: Diagnosis not present

## 2023-12-04 ENCOUNTER — Ambulatory Visit
Admission: RE | Admit: 2023-12-04 | Discharge: 2023-12-04 | Disposition: A | Discharge status: Home / Self Care | Payer: Medicare HMO | Source: Ambulatory Visit | Attending: Obstetrics and Gynecology | Admitting: Obstetrics and Gynecology

## 2023-12-04 DIAGNOSIS — Z1231 Encounter for screening mammogram for malignant neoplasm of breast: Secondary | ICD-10-CM | POA: Diagnosis not present

## 2023-12-04 DIAGNOSIS — X32XXXD Exposure to sunlight, subsequent encounter: Secondary | ICD-10-CM | POA: Diagnosis not present

## 2023-12-04 DIAGNOSIS — L570 Actinic keratosis: Secondary | ICD-10-CM | POA: Diagnosis not present

## 2023-12-04 DIAGNOSIS — L308 Other specified dermatitis: Secondary | ICD-10-CM | POA: Diagnosis not present

## 2024-02-10 DIAGNOSIS — Z961 Presence of intraocular lens: Secondary | ICD-10-CM | POA: Diagnosis not present

## 2024-02-10 DIAGNOSIS — H40013 Open angle with borderline findings, low risk, bilateral: Secondary | ICD-10-CM | POA: Diagnosis not present

## 2024-03-11 ENCOUNTER — Other Ambulatory Visit: Payer: Self-pay | Admitting: Cardiovascular Disease

## 2024-03-11 ENCOUNTER — Encounter: Payer: Self-pay | Admitting: Cardiovascular Disease

## 2024-03-11 DIAGNOSIS — I10 Essential (primary) hypertension: Secondary | ICD-10-CM

## 2024-03-11 DIAGNOSIS — I771 Stricture of artery: Secondary | ICD-10-CM

## 2024-03-26 ENCOUNTER — Ambulatory Visit (HOSPITAL_COMMUNITY)
Admission: RE | Admit: 2024-03-26 | Discharge: 2024-03-26 | Disposition: A | Discharge status: Home / Self Care | Source: Ambulatory Visit | Attending: Cardiovascular Disease | Admitting: Cardiovascular Disease

## 2024-03-26 ENCOUNTER — Encounter (HOSPITAL_COMMUNITY)

## 2024-03-26 DIAGNOSIS — I771 Stricture of artery: Secondary | ICD-10-CM | POA: Diagnosis not present

## 2024-03-28 ENCOUNTER — Ambulatory Visit: Payer: Self-pay | Admitting: Cardiovascular Disease

## 2024-04-20 ENCOUNTER — Ambulatory Visit: Attending: Cardiovascular Disease | Admitting: Cardiovascular Disease

## 2024-04-20 ENCOUNTER — Encounter: Payer: Self-pay | Admitting: Cardiovascular Disease

## 2024-04-20 VITALS — BP 128/62 | HR 66 | Ht 65.0 in | Wt 185.0 lb

## 2024-04-20 DIAGNOSIS — R002 Palpitations: Secondary | ICD-10-CM

## 2024-04-20 DIAGNOSIS — E785 Hyperlipidemia, unspecified: Secondary | ICD-10-CM

## 2024-04-20 DIAGNOSIS — I771 Stricture of artery: Secondary | ICD-10-CM | POA: Diagnosis not present

## 2024-04-20 DIAGNOSIS — I10 Essential (primary) hypertension: Secondary | ICD-10-CM | POA: Diagnosis not present

## 2024-04-20 NOTE — Progress Notes (Signed)
 04/20/2024 Kaitlyn Baker   [birth year]  [id number]  Primary Physician Kaitlyn Satterfield, MD Primary Cardiologist: Kaitlyn Kaitlyn Lesches MD GENI Kaitlyn Baker, Baker  HPI:  Kaitlyn Baker is a 83 y.o.  mildly overweight married Caucasian female, mother of 3, grandmother of 5 grandchildren, whose husband [name]  is also a patient of mine. I last saw her 03/27/2023. She has a history of hyperlipidemia, treated hypertension, and family history of heart disease. She was complaining of some atypical chest pain radiating to her back, which is a new finding for her 2 years ago. She had a Myoview  stress test performed on May 28, 2012, which was entirely normal, and subsequent to that her symptoms completely resolved.   She had  an episode of shortness of breath last summer which ultimately resolved spontaneously and turned out not to be anything serious.  She did spend time in [place] with her granddaughter several years ago touring [place], [place], [place] and [place].  Her granddaughter graduated [employer] with honors and now is considering going to medical school.      She did have carotid Dopplers performed 03/26/2021 because of an auscultated bruit that showed patent internal carotid arteries with a left subclavian artery stenosis and a 12 mm upper extremity blood pressure gradient although she had antegrade vertebral filling but no symptoms of subclavian steal.   Since I saw her a year ago she is remained stable.  She does have a new diagnosis of CLL although this has been relatively stable.  She denies chest pain or shortness of breath.  Her carotid Dopplers performed today showed no changes in her left subclavian artery stenosis.    She denies chest pain or shortness of breath.   No outpatient medications have been marked as taking for the 04/20/24 encounter (Office Visit) with Baker Kaitlyn JINNY, MD.     Allergies  Allergen Reactions   Methylprednisolone  Anaphylaxis and Rash     Injection medication-Severe rash and edema-tongue and vocal cords   Morphine Anaphylaxis   Demerol  [Meperidine ] Swelling    Patient developed tongue swelling. He was treated with Benadryl  and prednisone by PCP and is fine   Ibuprofen Swelling   Versed  [Midazolam ] Nausea And Vomiting    Hard time coming from under   Moviprep [Peg-Kcl-Nacl-Nasulf-Na Asc-C] Nausea And Vomiting    Severe itching all over, itching in the back of the throat, and lip swelling    Social History   Socioeconomic History   Marital status: Married    Spouse name: Not on file   Number of children: Not on file   Years of education: Not on file   Highest education level: Not on file  Occupational History   Not on file  Tobacco Use   Smoking status: Never   Smokeless tobacco: Never  Vaping Use   Vaping status: Never Used  Substance and Sexual Activity   Alcohol use: Yes    Comment: 1 glass of wine once a week   Drug use: No   Sexual activity: Not on file  Other Topics Concern   Not on file  Social History Narrative   Retired Charity fundraiser   Social Drivers of Corporate investment banker Strain: Low Risk  (04/05/2021)   Overall Financial Resource Strain (CARDIA)    Difficulty of Paying Living Expenses: Not hard at all  Food Insecurity: No Food Insecurity (04/05/2021)   Hunger Vital Sign    Worried About Programme researcher, broadcasting/film/video in  the Last Year: Never true    Ran Out of Food in the Last Year: Never true  Transportation Needs: No Transportation Needs (04/05/2021)   PRAPARE - Administrator, Civil Service (Medical): No    Lack of Transportation (Non-Medical): No  Physical Activity: Sufficiently Active (04/05/2021)   Exercise Vital Sign    Days of Exercise per Week: 7 days    Minutes of Exercise per Session: 30 min  Stress: No Stress Concern Present (04/05/2021)   [employer] of Occupational Health - Occupational Stress Questionnaire    Feeling of Stress : Not at all  Social Connections: Socially  Integrated (04/05/2021)   Social Connection and Isolation Panel    Frequency of Communication with Friends and Family: More than three times a week    Frequency of Social Gatherings with Friends and Family: More than three times a week    Attends Religious Services: More than 4 times per year    Active Member of [employer] or Organizations: Yes    Attends Banker Meetings: 1 to 4 times per year    Marital Status: Married  Catering manager Violence: Not At Risk (04/05/2021)   Humiliation, Afraid, Rape, and Kick questionnaire    Fear of Current or Ex-Partner: No    Emotionally Abused: No    Physically Abused: No    Sexually Abused: No     Review of Systems: General: negative for chills, fever, night sweats or weight changes.  Cardiovascular: negative for chest pain, dyspnea on exertion, edema, orthopnea, palpitations, paroxysmal nocturnal dyspnea or shortness of breath Dermatological: negative for rash Respiratory: negative for cough or wheezing Urologic: negative for hematuria Abdominal: negative for nausea, vomiting, diarrhea, bright red blood per rectum, melena, or hematemesis Neurologic: negative for visual changes, syncope, or dizziness All other systems reviewed and are otherwise negative except as noted above.    Blood pressure 128/62, pulse 66, height 5' 5 (1.651 m), weight 185 lb (83.9 kg), SpO2 98%.  General appearance: alert and no distress Neck: no adenopathy, no JVD, supple, symmetrical, trachea midline, thyroid  not enlarged, symmetric, no tenderness/mass/nodules, and bilateral carotid bruits left louder than right Lungs: clear to auscultation bilaterally Heart: regular rate and rhythm, S1, S2 normal, no murmur, click, rub or gallop Extremities: extremities normal, atraumatic, no cyanosis or edema Pulses: 2+ and symmetric Skin: Skin color, texture, turgor normal. No rashes or lesions Neurologic: Grossly normal  EKG EKG Interpretation Date/Time:  Tuesday April 20 2024 11:57:45 EDT Ventricular Rate:  66 PR Interval:  134 QRS Duration:  88 QT Interval:  392 QTC Calculation: 410 R Axis:   47  Text Interpretation: Normal sinus rhythm Normal ECG No previous ECGs available Confirmed by Court Carrier [phone]) on 04/20/2024 12:16:51 PM    ASSESSMENT AND PLAN:   HTN (hypertension) History of essential hypertension with blood pressure measured today at 128/62.  She is on amlodipine and benazepril.  Dyslipidemia History of dyslipidemia on statin therapy lipid profile performed 06/10/2023 belated total cholesterol 151, LDL of 81 and HDL of 50.  Stenosis of left subclavian artery (HCC) History of left subclavian artery stenosis with no increased velocity and only a 10 mm blood pressure differential, asymptomatic.     Carrier DOROTHA Court MD FACP,FACC,FAHA, [facility] 04/20/2024 12:27 PM

## 2024-04-20 NOTE — Assessment & Plan Note (Signed)
 History of dyslipidemia on statin therapy lipid profile performed 06/10/2023 belated total cholesterol 151, LDL of 81 and HDL of 50.

## 2024-04-20 NOTE — Assessment & Plan Note (Signed)
 History of essential hypertension with blood pressure measured today at 128/62.  She is on amlodipine and benazepril.

## 2024-04-20 NOTE — Assessment & Plan Note (Signed)
 History of left subclavian artery stenosis with no increased velocity and only a 10 mm blood pressure differential, asymptomatic.

## 2024-04-20 NOTE — Patient Instructions (Signed)
 Medication Instructions:  Your physician recommends that you continue on your current medications as directed. Please refer to the Current Medication list given to you today.  *If you need a refill on your cardiac medications before your next appointment, please call your pharmacy*  Lab Work: If you have labs (blood work) drawn today and your tests are completely normal, you will receive your results only by: MyChart Message (if you have MyChart) OR A paper copy in the mail If you have any lab test that is abnormal or we need to change your treatment, we will call you to review the results.  Testing/Procedures: None ordered today.  Follow-Up: At [facility], you and your health needs are our priority.  As part of our continuing mission to provide you with exceptional heart care, our providers are all part of one team.  This team includes your primary Cardiologist (physician) and Advanced Practice Providers or APPs (Physician Assistants and Nurse Practitioners) who all work together to provide you with the care you need, when you need it.  Your next appointment:   1 year(s)  Provider:   Dorn Lesches, MD    We recommend signing up for the patient portal called MyChart.  Sign up information is provided on this After Visit Summary.  MyChart is used to connect with patients for Virtual Visits (Telemedicine).  Patients are able to view lab/test results, encounter notes, upcoming appointments, etc.  Non-urgent messages can be sent to your provider as well.   To learn more about what you can do with MyChart, go to [email or web address].

## 2024-05-10 ENCOUNTER — Inpatient Hospital Stay: Attending: Hematology

## 2024-05-10 DIAGNOSIS — D72829 Elevated white blood cell count, unspecified: Secondary | ICD-10-CM | POA: Insufficient documentation

## 2024-05-10 DIAGNOSIS — D72821 Monocytosis (symptomatic): Secondary | ICD-10-CM | POA: Insufficient documentation

## 2024-05-10 DIAGNOSIS — C9110 Chronic lymphocytic leukemia of B-cell type not having achieved remission: Secondary | ICD-10-CM

## 2024-05-10 LAB — CBC WITH DIFFERENTIAL/PLATELET
Abs Immature Granulocytes: 0.04 K/uL (ref 0.00–0.07)
Basophils Absolute: 0.1 K/uL (ref 0.0–0.1)
Basophils Relative: 1 %
Eosinophils Absolute: 0.3 K/uL (ref 0.0–0.5)
Eosinophils Relative: 3 %
HCT: 44.5 % (ref 36.0–46.0)
Hemoglobin: 14.4 g/dL (ref 12.0–15.0)
Immature Granulocytes: 0 %
Lymphocytes Relative: 24 %
Lymphs Abs: 2.7 K/uL (ref 0.7–4.0)
MCH: 27.3 pg (ref 26.0–34.0)
MCHC: 32.4 g/dL (ref 30.0–36.0)
MCV: 84.3 fL (ref 80.0–100.0)
Monocytes Absolute: 1.6 K/uL — ABNORMAL HIGH (ref 0.1–1.0)
Monocytes Relative: 14 %
Neutro Abs: 6.7 K/uL (ref 1.7–7.7)
Neutrophils Relative %: 58 %
Platelets: 268 K/uL (ref 150–400)
RBC: 5.28 MIL/uL — ABNORMAL HIGH (ref 3.87–5.11)
RDW: 14.3 % (ref 11.5–15.5)
WBC: 11.4 K/uL — ABNORMAL HIGH (ref 4.0–10.5)
nRBC: 0 % (ref 0.0–0.2)

## 2024-05-10 LAB — LACTATE DEHYDROGENASE: LDH: 135 U/L (ref 98–192)

## 2024-05-18 ENCOUNTER — Inpatient Hospital Stay: Payer: Medicare HMO

## 2024-05-19 ENCOUNTER — Other Ambulatory Visit: Payer: Medicare HMO

## 2024-05-24 NOTE — Progress Notes (Unsigned)
 [facility] [address], [place] [address]   CLINIC:  Medical Oncology/Hematology  PCP:  Bertell Satterfield, MD [address] [place] [address] [phone]    *** RESUME PREP BELOW ***    REASON FOR VISIT:  Follow-up for leukocytosis with monocytosis (suspected CMML)  PRIOR THERAPY: None  CURRENT THERAPY: Surveillance  INTERVAL HISTORY:   Kaitlyn Baker 83 y.o. female returns for routine follow-up of her leukocytosis with monocytosis, which is suspected to be CMML.  She was last seen by Pleasant Barefoot PA-C on 12/09/2022.  At today's visit, she reports feeling fairly well.  No recent hospitalizations, surgeries, or changes in baseline health status.  She denies any abnormal fatigue.   Her previous concerns about weight loss have resolved and her weight has been stable. She denies any unexplained fevers, chills, or drenching night sweats.    She denies any frequent infections.    She has not had any systemic steroids since steroid injection to her shoulder on 05/14/2022.   She has not noticed any new lumps or bumps.   She denies early satiety or abdominal fullness.  She has 80% energy and 100% appetite. She endorses that she is maintaining a stable weight.  ASSESSMENT & PLAN:  1.  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  Prior to that her white count was 11.6 with ANC of 7.2 and absolute monocyte count of 1.5. - Work-up unremarkable (04/06/2021):  JAK2, CALR, and MPL were negative; BCR/ABL negative; rheumatoid factor and ANA negative; LDH, CRP and ESR within normal limits - She is a lifelong non-smoker.  No history of splenectomy.  No known autoimmune or inflammatory conditions.   - No splenomegaly or lymphadenopathy appreciated on exam - No chronic systemic  steroids.  Received steroid injection into her shoulder on 05/14/2022  - She denies any B symptoms or recent infections. - Lab trends: (10/31/2021): Normal WBC 9.7, persistently elevated monocytes 1.3 (05/30/2022 - drawn by PCP 2 weeks after steroid injection): WBC 18.1, with ANC 12.1 and monocytes 2.3 (06/13/2022): WBC trending down at 14.9/ANC 10.1/monocytes 2.0.  LDH normal. (07/19/2022): WBC 13.1, ANC 8.8, monocytes 1.8 - CBC today (01/13/2023): WBC 11.4/monocytes 1.7 (15%)/ANC 7.0 - [employer] myeloid panel (12/23/2022): ASXL1 detected: Highly associated with CMML and also frequently seen in AML, MPN, MDS, and age-related clonal hematopoiesis NRAS detected: Contributes to phenotype similar to CMML or AML like disease and MDS. - DIFFERENTIAL DIAGNOSIS: Consider likely Chronic Myelomonocytic Leukemia (CMML) in the setting of persistent monocytic and neutrophilic leukocytosis and ASXL1 and NRAS mutations. dDx also includes age-related clonal hematopoiesis [facility]), which is sometimes considered preleukemic and has been associated with increased risk for blood cancers. dDx also includes other MDS and MPN syndromes - Discussed with patient that since she is currently asymptomatic we can continue active surveillance or obtain additional workup (bone marrow biopsy) if she prefers. If any progressive monocytosis, WBC >15, cytopenias, or constitutional symptoms would absolutely recommend bone marrow biopsy for risk stratification and possible treatment. - PLAN: Discussed with Dr. Rogers, who agrees with the above.  We will continue to monitor closely with CBC/differential and office visit every 3 months. - We will consider bone marrow biopsy if patient prefers or in the presence of severe leukocytosis, cytopenias, or constitutional symptoms.  Patient is agreeable to hold off on bone marrow biopsy for the  time being. - Repeat CBC/differential with MD VISIT with DR. KATRAGADDA in 3 months    2.   Social/family history: - She has Gilbert's disease -- She is a retired Designer, jewellery.  Never smoker.  No exposure to chemicals. - No family history of leukemia.  Paternal grandmother had breast cancer.    PLAN SUMMARY: >> Labs in 3 months = CBC/differential, LDH >> MD VISIT with DR. KATRAGADDA in 3 months     REVIEW OF SYSTEMS:   Review of Systems  Constitutional:  Negative for appetite change, chills, diaphoresis, fatigue, fever and unexpected weight change.  HENT:   Negative for lump/mass and nosebleeds.   Eyes:  Negative for eye problems.  Respiratory:  Negative for cough, hemoptysis and shortness of breath.   Cardiovascular:  Negative for chest pain, leg swelling and palpitations.  Gastrointestinal:  Negative for abdominal pain, blood in stool, constipation, diarrhea, nausea and vomiting.  Genitourinary:  Negative for hematuria.   Skin: Negative.   Neurological:  Negative for dizziness, headaches and light-headedness.  Hematological:  Does not bruise/bleed easily.     PHYSICAL EXAM:   ECOG PERFORMANCE STATUS: 0 - Asymptomatic There were no vitals filed for this visit. There were no vitals filed for this visit. Physical Exam Constitutional:      Appearance: Normal appearance.  HENT:     Head: Normocephalic and atraumatic.     Mouth/Throat:     Mouth: Mucous membranes are moist.  Eyes:     Extraocular Movements: Extraocular movements intact.     Pupils: Pupils are equal, round, and reactive to light.  Neck:     Vascular: Carotid bruit (left) present.  Cardiovascular:     Rate and Rhythm: Normal rate and regular rhythm.     Pulses: Normal pulses.     Heart sounds: Murmur (radiation of carotid bruits) heard.  Pulmonary:     Effort: Pulmonary effort is normal.     Breath sounds: Normal breath sounds.  Abdominal:     General: Bowel sounds are normal.     Palpations: Abdomen is soft. There is no splenomegaly.     Tenderness: There is no abdominal tenderness.   Musculoskeletal:        General: No swelling.     Right lower leg: No edema.     Left lower leg: No edema.  Lymphadenopathy:     Cervical: No cervical adenopathy.  Skin:    General: Skin is warm and dry.  Neurological:     General: No focal deficit present.     Mental Status: She is alert and oriented to person, place, and time.  Psychiatric:        Mood and Affect: Mood normal.        Behavior: Behavior normal.    PAST MEDICAL/SURGICAL HISTORY:  Past Medical History:  Diagnosis Date   Dyslipidemia    History of cardiac monitoring    HTN (hypertension)    Hypothyroidism    Rheumatic fever    as a child   Past Surgical History:  Procedure Laterality Date   COLONOSCOPY N/A 02/02/2015   Procedure: COLONOSCOPY;  Surgeon: Claudis RAYMOND Rivet, MD;  Location: [facility];  Service: Endoscopy;  Laterality: N/A;  930   COLONOSCOPY N/A 09/20/2021   Procedure: COLONOSCOPY;  Surgeon: Rivet Claudis RAYMOND, MD;  Location: [facility];  Service: Endoscopy;  Laterality: N/A;  10:45   ROTATOR CUFF REPAIR  1991   TUBAL LIGATION  1973    SOCIAL HISTORY:  Social History   Socioeconomic History   Marital status: Married    Spouse name: Not on file   Number of children: Not on file   Years of education: Not on file   Highest education level: Not on file  Occupational History   Not on file  Tobacco Use   Smoking status: Never   Smokeless tobacco: Never  Vaping Use   Vaping status: Never Used  Substance and Sexual Activity   Alcohol use: Yes    Comment: 1 glass of wine once a week   Drug use: No   Sexual activity: Not on file  Other Topics Concern   Not on file  Social History Narrative   Retired Charity fundraiser   Social Drivers of Corporate investment banker Strain: Low Risk  (04/05/2021)   Overall Financial Resource Strain (CARDIA)    Difficulty of Paying Living Expenses: Not hard at all  Food Insecurity: No Food Insecurity (04/05/2021)   Hunger Vital Sign    Worried About Running Out of  Food in the Last Year: Never true    Ran Out of Food in the Last Year: Never true  Transportation Needs: No Transportation Needs (04/05/2021)   PRAPARE - Administrator, Civil Service (Medical): No    Lack of Transportation (Non-Medical): No  Physical Activity: Sufficiently Active (04/05/2021)   Exercise Vital Sign    Days of Exercise per Week: 7 days    Minutes of Exercise per Session: 30 min  Stress: No Stress Concern Present (04/05/2021)   [employer] of Occupational Health - Occupational Stress Questionnaire    Feeling of Stress : Not at all  Social Connections: Socially Integrated (04/05/2021)   Social Connection and Isolation Panel    Frequency of Communication with Friends and Family: More than three times a week    Frequency of Social Gatherings with Friends and Family: More than three times a week    Attends Religious Services: More than 4 times per year    Active Member of [employer] or Organizations: Yes    Attends Banker Meetings: 1 to 4 times per year    Marital Status: Married  Catering manager Violence: Not At Risk (04/05/2021)   Humiliation, Afraid, Rape, and Kick questionnaire    Fear of Current or Ex-Partner: No    Emotionally Abused: No    Physically Abused: No    Sexually Abused: No    FAMILY HISTORY:  Family History  Problem Relation Age of Onset   Coronary artery disease Father 38       cardiac arrest   Diabetes Father    Breast cancer Paternal Grandmother     CURRENT MEDICATIONS:  Outpatient Encounter Medications as of 05/25/2024  Medication Sig   acetaminophen  (TYLENOL ) 500 MG tablet Take 500 mg by mouth daily as needed (pain).   amLODipine-benazepril (LOTREL) 10-20 MG per capsule Take 1 capsule by mouth daily.   aspirin 81 MG tablet Take 81 mg by mouth daily.   chlorzoxazone (PARAFON) 500 MG tablet Take 500 mg by mouth 3 (three) times daily.   Cholecalciferol (VITAMIN D3) 50 MCG (2000 UT) capsule Take 2,000 Units by mouth daily.    levothyroxine (SYNTHROID, LEVOTHROID) 50 MCG tablet Take 50 mcg by mouth daily.   simvastatin (ZOCOR) 20 MG tablet Take 20 mg by mouth daily.   No facility-administered encounter medications on file as of 05/25/2024.    ALLERGIES:  Allergies  Allergen Reactions   Methylprednisolone  Anaphylaxis and Rash  Injection medication-Severe rash and edema-tongue and vocal cords   Morphine Anaphylaxis   Demerol  [Meperidine ] Swelling    Patient developed tongue swelling. He was treated with Benadryl  and prednisone by PCP and is fine   Ibuprofen Swelling   Versed  [Midazolam ] Nausea And Vomiting    Hard time coming from under   Moviprep [Peg-Kcl-Nacl-Nasulf-Na Asc-C] Nausea And Vomiting    Severe itching all over, itching in the back of the throat, and lip swelling    LABORATORY DATA:  I have reviewed the labs as listed.  CBC    Component Value Date/Time   WBC 11.4 (H) 05/10/2024 0732   RBC 5.28 (H) 05/10/2024 0732   HGB 14.4 05/10/2024 0732   HCT 44.5 05/10/2024 0732   PLT 268 05/10/2024 0732   MCV 84.3 05/10/2024 0732   MCH 27.3 05/10/2024 0732   MCHC 32.4 05/10/2024 0732   RDW 14.3 05/10/2024 0732   LYMPHSABS 2.7 05/10/2024 0732   MONOABS 1.6 (H) 05/10/2024 0732   EOSABS 0.3 05/10/2024 0732   BASOSABS 0.1 05/10/2024 0732      Latest Ref Rng & Units 10/31/2021    8:17 AM 08/21/2021    1:36 PM 02/08/2020    5:35 PM  CMP  Glucose 70 - 99 mg/dL 882  85  864   BUN 8 - 23 mg/dL 17  14  15    Creatinine 0.44 - 1.00 mg/dL 9.24  9.24  9.20   Sodium 135 - 145 mmol/L 140  140  137   Potassium 3.5 - 5.1 mmol/L 4.3  4.3  4.0   Chloride 98 - 111 mmol/L 108  106  104   CO2 22 - 32 mmol/L 27  23  23    Calcium 8.9 - 10.3 mg/dL 9.1  9.9  9.6   Total Protein 6.5 - 8.1 g/dL 6.5  6.8    Total Bilirubin 0.3 - 1.2 mg/dL 0.9  2.4    Alkaline Phos 38 - 126 U/L 78     AST 15 - 41 U/L 16  15    ALT 0 - 44 U/L 19  14      DIAGNOSTIC IMAGING:  I have independently reviewed the relevant imaging  and discussed with the patient.   WRAP UP:  All questions were answered. The patient knows to call the clinic with any problems, questions or concerns.  Medical decision making: Moderate  Time spent on visit: I spent 20 minutes counseling the patient face to face. The total time spent in the appointment was 30 minutes and more than 50% was on counseling.  Pleasant CHRISTELLA Barefoot, PA-C  05/24/24 1:18 PM

## 2024-05-25 ENCOUNTER — Inpatient Hospital Stay: Payer: Medicare HMO | Attending: Hematology | Admitting: Physician Assistant

## 2024-05-25 VITALS — BP 122/78 | HR 72 | Temp 98.3°F | Resp 18 | Wt 184.3 lb

## 2024-05-25 DIAGNOSIS — E785 Hyperlipidemia, unspecified: Secondary | ICD-10-CM | POA: Diagnosis not present

## 2024-05-25 DIAGNOSIS — R6881 Early satiety: Secondary | ICD-10-CM | POA: Insufficient documentation

## 2024-05-25 DIAGNOSIS — I10 Essential (primary) hypertension: Secondary | ICD-10-CM | POA: Diagnosis not present

## 2024-05-25 DIAGNOSIS — Z803 Family history of malignant neoplasm of breast: Secondary | ICD-10-CM | POA: Insufficient documentation

## 2024-05-25 DIAGNOSIS — E039 Hypothyroidism, unspecified: Secondary | ICD-10-CM | POA: Diagnosis not present

## 2024-05-25 DIAGNOSIS — Z79899 Other long term (current) drug therapy: Secondary | ICD-10-CM | POA: Diagnosis not present

## 2024-05-25 DIAGNOSIS — Z79890 Hormone replacement therapy (postmenopausal): Secondary | ICD-10-CM | POA: Insufficient documentation

## 2024-05-25 DIAGNOSIS — C9110 Chronic lymphocytic leukemia of B-cell type not having achieved remission: Secondary | ICD-10-CM | POA: Diagnosis not present

## 2024-05-25 DIAGNOSIS — Z7982 Long term (current) use of aspirin: Secondary | ICD-10-CM | POA: Insufficient documentation

## 2024-05-25 NOTE — Patient Instructions (Addendum)
 [facility] **VISIT SUMMARY & IMPORTANT INSTRUCTIONS **   You were seen today by Pleasant Barefoot PA-C for your CLL (chronic lymphocytic leukemia).   Your labs show stable elevation in your white blood cell count. You do not meet criteria to start any treatment for CLL at this time. We will check a CT scan of your abdomen and pelvis due to your new symptom of early satiety (getting full early). Will continue surveillance with repeat labs and office visit in 6 months. Please contact us  sooner if you notice any lymph node swelling or masses, or have any unexplained weight loss, recurrent fevers, or drenching night sweats.  FOLLOW-UP APPOINTMENT: 6 months  ** Thank you for trusting me with your healthcare!  I strive to provide all of my patients with quality care at each visit.  If you receive a survey for this visit, I would be so grateful to you for taking the time to provide feedback.  Thank you in advance!  ~ Naomi Castrogiovanni                   Dr. [name]   &   Pleasant Barefoot, PA-C   - - - - - - - - - - - - - - - - - -    Thank you for choosing [facility] to provide your oncology and hematology care.  To afford each patient quality time with our provider, please arrive at least 15 minutes before your scheduled appointment time.   If you have a lab appointment with the Cancer Center please come in thru the Main Entrance and check in at the main information desk.  You need to re-schedule your appointment should you arrive 10 or more minutes late.  We strive to give you quality time with our providers, and arriving late affects you and other patients whose appointments are after yours.  Also, if you no show three or more times for appointments you may be dismissed from the clinic at the providers discretion.     Again, thank you for choosing [facility].  Our hope is that these requests will  decrease the amount of time that you wait before being seen by our physicians.       _____________________________________________________________  Should you have questions after your visit to [facility], please contact our office at [phone] and follow the prompts.  Our office hours are 8:00 a.m. and 4:30 p.m. Monday - Friday.  Please note that voicemails left after 4:00 p.m. may not be returned until the following business day.  We are closed weekends and major holidays.  You do have access to a nurse 24-7, just call the main number to the clinic [phone] and do not press any options, hold on the line and a nurse will answer the phone.    For prescription refill requests, have your pharmacy contact our office and allow 72 hours.

## 2024-05-26 ENCOUNTER — Ambulatory Visit: Payer: Medicare HMO | Admitting: Physician Assistant

## 2024-06-16 DIAGNOSIS — R140 Abdominal distension (gaseous): Secondary | ICD-10-CM | POA: Diagnosis not present

## 2024-06-16 DIAGNOSIS — E039 Hypothyroidism, unspecified: Secondary | ICD-10-CM | POA: Diagnosis not present

## 2024-06-16 DIAGNOSIS — Z6830 Body mass index (BMI) 30.0-30.9, adult: Secondary | ICD-10-CM | POA: Diagnosis not present

## 2024-06-16 DIAGNOSIS — C9310 Chronic myelomonocytic leukemia not having achieved remission: Secondary | ICD-10-CM | POA: Diagnosis not present

## 2024-06-16 DIAGNOSIS — I10 Essential (primary) hypertension: Secondary | ICD-10-CM | POA: Diagnosis not present

## 2024-06-16 DIAGNOSIS — Z1331 Encounter for screening for depression: Secondary | ICD-10-CM | POA: Diagnosis not present

## 2024-06-16 DIAGNOSIS — Z0001 Encounter for general adult medical examination with abnormal findings: Secondary | ICD-10-CM | POA: Diagnosis not present

## 2024-06-16 DIAGNOSIS — E6609 Other obesity due to excess calories: Secondary | ICD-10-CM | POA: Diagnosis not present

## 2024-06-28 DIAGNOSIS — L820 Inflamed seborrheic keratosis: Secondary | ICD-10-CM | POA: Diagnosis not present

## 2024-06-28 DIAGNOSIS — L570 Actinic keratosis: Secondary | ICD-10-CM | POA: Diagnosis not present

## 2024-06-28 DIAGNOSIS — D225 Melanocytic nevi of trunk: Secondary | ICD-10-CM | POA: Diagnosis not present

## 2024-06-28 DIAGNOSIS — X32XXXD Exposure to sunlight, subsequent encounter: Secondary | ICD-10-CM | POA: Diagnosis not present

## 2024-06-28 DIAGNOSIS — Z1283 Encounter for screening for malignant neoplasm of skin: Secondary | ICD-10-CM | POA: Diagnosis not present

## 2024-06-29 ENCOUNTER — Ambulatory Visit (HOSPITAL_COMMUNITY)
Admission: RE | Admit: 2024-06-29 | Discharge: 2024-06-29 | Disposition: A | Discharge status: Home / Self Care | Source: Ambulatory Visit | Attending: Physician Assistant | Admitting: Physician Assistant

## 2024-06-29 DIAGNOSIS — C9110 Chronic lymphocytic leukemia of B-cell type not having achieved remission: Secondary | ICD-10-CM | POA: Insufficient documentation

## 2024-06-29 DIAGNOSIS — K5730 Diverticulosis of large intestine without perforation or abscess without bleeding: Secondary | ICD-10-CM | POA: Diagnosis not present

## 2024-06-29 DIAGNOSIS — R591 Generalized enlarged lymph nodes: Secondary | ICD-10-CM | POA: Diagnosis not present

## 2024-06-29 MED ORDER — IOHEXOL 300 MG/ML  SOLN
100.0000 mL | Freq: Once | INTRAMUSCULAR | Status: AC | PRN
  Administered 2024-06-29: 100 mL via INTRAVENOUS

## 2024-07-06 ENCOUNTER — Ambulatory Visit: Payer: Self-pay | Admitting: Physician Assistant

## 2024-08-04 ENCOUNTER — Encounter (INDEPENDENT_AMBULATORY_CARE_PROVIDER_SITE_OTHER): Payer: Self-pay | Admitting: Gastroenterology

## 2024-08-26 DIAGNOSIS — I10 Essential (primary) hypertension: Secondary | ICD-10-CM | POA: Diagnosis not present

## 2024-08-26 DIAGNOSIS — E039 Hypothyroidism, unspecified: Secondary | ICD-10-CM | POA: Diagnosis not present

## 2024-08-26 DIAGNOSIS — C9310 Chronic myelomonocytic leukemia not having achieved remission: Secondary | ICD-10-CM | POA: Diagnosis not present

## 2024-08-26 DIAGNOSIS — E559 Vitamin D deficiency, unspecified: Secondary | ICD-10-CM | POA: Diagnosis not present

## 2024-08-26 DIAGNOSIS — E538 Deficiency of other specified B group vitamins: Secondary | ICD-10-CM | POA: Diagnosis not present

## 2024-08-26 DIAGNOSIS — E785 Hyperlipidemia, unspecified: Secondary | ICD-10-CM | POA: Diagnosis not present

## 2024-08-26 DIAGNOSIS — I6522 Occlusion and stenosis of left carotid artery: Secondary | ICD-10-CM | POA: Diagnosis not present

## 2024-11-17 ENCOUNTER — Other Ambulatory Visit: Payer: Self-pay | Admitting: Obstetrics and Gynecology

## 2024-11-17 DIAGNOSIS — Z1231 Encounter for screening mammogram for malignant neoplasm of breast: Secondary | ICD-10-CM

## 2024-11-23 ENCOUNTER — Inpatient Hospital Stay

## 2024-11-23 DIAGNOSIS — C9110 Chronic lymphocytic leukemia of B-cell type not having achieved remission: Secondary | ICD-10-CM

## 2024-11-23 LAB — CBC WITH DIFFERENTIAL/PLATELET
Abs Immature Granulocytes: 0.05 10*3/uL (ref 0.00–0.07)
Basophils Absolute: 0.1 10*3/uL (ref 0.0–0.1)
Basophils Relative: 1 %
Eosinophils Absolute: 0.5 10*3/uL (ref 0.0–0.5)
Eosinophils Relative: 3 %
HCT: 44.4 % (ref 36.0–46.0)
Hemoglobin: 14.3 g/dL (ref 12.0–15.0)
Immature Granulocytes: 0 %
Lymphocytes Relative: 26 %
Lymphs Abs: 3.7 10*3/uL (ref 0.7–4.0)
MCH: 27.2 pg (ref 26.0–34.0)
MCHC: 32.2 g/dL (ref 30.0–36.0)
MCV: 84.4 fL (ref 80.0–100.0)
Monocytes Absolute: 2.3 10*3/uL — ABNORMAL HIGH (ref 0.1–1.0)
Monocytes Relative: 16 %
Neutro Abs: 7.7 10*3/uL (ref 1.7–7.7)
Neutrophils Relative %: 54 %
Platelets: 275 10*3/uL (ref 150–400)
RBC: 5.26 MIL/uL — ABNORMAL HIGH (ref 3.87–5.11)
RDW: 14.5 % (ref 11.5–15.5)
WBC: 14.3 10*3/uL — ABNORMAL HIGH (ref 4.0–10.5)
nRBC: 0 % (ref 0.0–0.2)

## 2024-11-23 LAB — LACTATE DEHYDROGENASE: LDH: 196 U/L (ref 105–235)

## 2024-11-30 ENCOUNTER — Inpatient Hospital Stay: Admitting: Physician Assistant

## 2024-12-07 ENCOUNTER — Ambulatory Visit
